# Patient Record
Sex: Male | Born: 1950 | Race: White | Hispanic: No | Marital: Married | Smoking: Former smoker
Health system: Southern US, Community
[De-identification: ages and names within clinical notes are randomized; demographics above are authoritative.]

## PROBLEM LIST (undated history)

## (undated) DIAGNOSIS — E7800 Pure hypercholesterolemia, unspecified: Secondary | ICD-10-CM

## (undated) DIAGNOSIS — D369 Benign neoplasm, unspecified site: Secondary | ICD-10-CM

## (undated) DIAGNOSIS — F319 Bipolar disorder, unspecified: Secondary | ICD-10-CM

## (undated) DIAGNOSIS — M069 Rheumatoid arthritis, unspecified: Secondary | ICD-10-CM

## (undated) DIAGNOSIS — Z9889 Other specified postprocedural states: Secondary | ICD-10-CM

## (undated) DIAGNOSIS — N189 Chronic kidney disease, unspecified: Secondary | ICD-10-CM

## (undated) DIAGNOSIS — A630 Anogenital (venereal) warts: Secondary | ICD-10-CM

## (undated) DIAGNOSIS — N400 Enlarged prostate without lower urinary tract symptoms: Secondary | ICD-10-CM

## (undated) HISTORY — DX: Chronic kidney disease, unspecified: N18.9

## (undated) HISTORY — PX: OTHER SURGICAL HISTORY: SHX169

## (undated) HISTORY — DX: Bipolar disorder, unspecified: F31.9

## (undated) HISTORY — DX: Other specified postprocedural states: Z98.890

## (undated) HISTORY — DX: Benign neoplasm, unspecified site: D36.9

## (undated) HISTORY — DX: Anogenital (venereal) warts: A63.0

## (undated) HISTORY — DX: Rheumatoid arthritis, unspecified: M06.9

## (undated) HISTORY — PX: APPENDECTOMY: SHX54

---

## 2002-02-09 DIAGNOSIS — D369 Benign neoplasm, unspecified site: Secondary | ICD-10-CM

## 2002-02-09 HISTORY — DX: Benign neoplasm, unspecified site: D36.9

## 2002-05-18 ENCOUNTER — Ambulatory Visit (HOSPITAL_COMMUNITY): Admission: RE | Admit: 2002-05-18 | Discharge: 2002-05-18 | Payer: Self-pay | Admitting: General Surgery

## 2006-02-09 DIAGNOSIS — Z9889 Other specified postprocedural states: Secondary | ICD-10-CM

## 2006-02-09 HISTORY — DX: Other specified postprocedural states: Z98.890

## 2006-03-04 ENCOUNTER — Ambulatory Visit (HOSPITAL_COMMUNITY): Admission: RE | Admit: 2006-03-04 | Discharge: 2006-03-04 | Payer: Self-pay | Admitting: General Surgery

## 2010-11-27 ENCOUNTER — Ambulatory Visit (INDEPENDENT_AMBULATORY_CARE_PROVIDER_SITE_OTHER): Payer: PRIVATE HEALTH INSURANCE | Admitting: Gastroenterology

## 2010-11-27 ENCOUNTER — Encounter: Payer: Self-pay | Admitting: Gastroenterology

## 2010-11-27 VITALS — BP 142/82 | HR 96 | Temp 97.9°F | Ht 70.0 in | Wt 259.6 lb

## 2010-11-27 DIAGNOSIS — R197 Diarrhea, unspecified: Secondary | ICD-10-CM

## 2010-11-27 DIAGNOSIS — D369 Benign neoplasm, unspecified site: Secondary | ICD-10-CM

## 2010-11-27 NOTE — Patient Instructions (Signed)
Please complete stool samples and return to our office.  In the meantime, start taking a probiotic daily. We have provided you with samples.  We will be in touch shortly to determine the next step. Try to follow a low-fat diet, avoiding greasy foods.

## 2010-11-27 NOTE — Progress Notes (Signed)
Referring Provider: Dr. Dimas Aguas Primary Care Physician:  Criss Rosales, MD Primary Gastroenterologist:  Dr. Jena Gauss   Chief Complaint  Patient presents with  . Abdominal Pain  . 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 He reports at times standing up and finding it "all over the place". He has been on Zocor chronically, but he stopped this 8 days ago. Since then, he has noted no further episodes of explosive diarrhea. He now has 2-3 soft bowel movements a day. He takes anti-diarrhea medication occasionally, especially when driving. Denies melena or hematochezia. Denies abdominal pain, nausea or vomiting. No change in wt.  Tries to avoid fatty/greasy foods, drives truck so hard to find places to stop. Tries to eat large amounts of vegetables.   Last colonoscopy by Dr. Lovell Sheehan in 2008, findings below. Hx of adenomatous polyps in 2004 per Dr. Lovell Sheehan' H&P in echart.   No recent abx or change in medication (other than stopping Zocor).    Past Medical History  Diagnosis Date  . Bipolar disorder   . Diabetes mellitus   . S/P colonoscopy Jan 2008    diverticulosis in sigmoid, Dr. Lovell Sheehan  . Adenomatous polyps 2004    per H&P in echart by Dr. Lovell Sheehan    Past Surgical History  Procedure Date  . Varicocele repair   . Appendectomy     Current Outpatient Prescriptions  Medication Sig Dispense Refill  . celecoxib (CELEBREX) 200 MG capsule Take 200 mg by mouth daily. As needed  (very seldom)       . lithium carbonate 300 MG capsule Take 300 mg by mouth 3 (three) times daily with meals. Takes two tablets in the AM and two tablets in the PM       . metFORMIN (GLUCOPHAGE) 850 MG tablet Take 850 mg by mouth 2 (two) times daily with a meal. One tablet in the AM and one tablet in the PM       . sitaGLIPtin (JANUVIA) 100 MG tablet Take 100 mg by mouth daily. IN the AM          Allergies as of 11/27/2010 - Review Complete 11/27/2010  Allergen Reaction Noted  . Penicillins Shortness Of Breath 11/27/2010    Family History  Problem Relation Age of Onset  . Colon cancer Neg Hx     History   Social History  . Marital Status: Single    Spouse Name: N/A    Number of Children: N/A  . Years of Education: N/A   Occupational History  . Not on file.   Social History Main Topics  . Smoking status: Former Games developer  . Smokeless tobacco: Not on file   Comment: Stopped smoking 10 years ago  . Alcohol Use: Not on file  . Drug Use: Not on file  . Sexually Active: Not on file   Other Topics Concern  . Not on file   Social History Narrative  . No narrative on file    Review of Systems: Gen: Denies any fever, chills, loss of appetite, fatigue, weight loss. CV: Denies chest pain, heart palpitations, syncope, peripheral edema. Resp: Denies shortness of breath with rest, cough, wheezing GI: Denies dysphagia or odynophagia. Denies hematemesis, fecal incontinence, or jaundice.  GU : Denies urinary burning, urinary frequency, urinary incontinence.  MS: Denies joint pain, muscle weakness, cramps, limited movement Derm: Denies rash, itching, dry skin  Psych: Denies depression, anxiety, confusion or memory loss  Heme: Denies bruising, bleeding, and enlarged lymph nodes.  Physical Exam: BP 142/82  Pulse 96  Temp(Src) 97.9 F (36.6 C) (Tympanic)  Ht 5\' 10"  (1.778 m)  Wt 259 lb 9.6 oz (117.754 kg)  BMI 37.25 kg/m2 General:   Alert and oriented. Well-developed, well-nourished, pleasant and cooperative. Obese  Head:  Normocephalic and atraumatic. Eyes:  Conjunctiva pink, sclera clear, no icterus.   Conjunctiva pink. Ears:  Normal auditory acuity. Nose:  No deformity, discharge,  or lesions. Mouth:  No deformity or lesions, mucosa pink and moist.  Neck:  Supple, without mass or thyromegaly. Lungs:  Clear to auscultation bilaterally, without wheezing, rales, or  rhonchi.  Heart:  S1, S2 present without murmurs noted.  Abdomen:  +BS, soft, obese, non-tender and non-distended. Without mass or HSM. No rebound or guarding. Large ventral hernia vs rectus diastasis Rectal:  Deferred  Msk:  Symmetrical without gross deformities. Normal posture. Extremities:  Without clubbing or edema. Neurologic:  Alert and  oriented x4;  grossly normal neurologically. Skin:  Intact, warm and dry without significant lesions or rashes Cervical Nodes:  No significant cervical adenopathy. Psych:  Alert and cooperative. Normal mood and affect.

## 2010-11-29 ENCOUNTER — Other Ambulatory Visit: Payer: Self-pay | Admitting: Gastroenterology

## 2010-12-01 ENCOUNTER — Encounter: Payer: Self-pay | Admitting: Gastroenterology

## 2010-12-01 ENCOUNTER — Ambulatory Visit (INDEPENDENT_AMBULATORY_CARE_PROVIDER_SITE_OTHER): Payer: PRIVATE HEALTH INSURANCE | Admitting: Gastroenterology

## 2010-12-01 DIAGNOSIS — R197 Diarrhea, unspecified: Secondary | ICD-10-CM | POA: Insufficient documentation

## 2010-12-01 DIAGNOSIS — D369 Benign neoplasm, unspecified site: Secondary | ICD-10-CM | POA: Insufficient documentation

## 2010-12-01 LAB — IFOBT (OCCULT BLOOD): IFOBT: NEGATIVE

## 2010-12-01 NOTE — Assessment & Plan Note (Signed)
60 year old male with at least a year-long hx of "explosive diarrhea", works as Naval architect. Has stopped Zocor X 8 days due to concerns of possible side effect of diarrhea; he has noticed significant improvement since this time. Now down to 2-3 soft stools per day. No melena or hematochezia, denies abdominal pain. Eats what is convenient while on the road, but he is trying to ingest higher fiber options, fruits, veggies. Last colonoscopy in 2008 and hx of adenomatous polyps as well. Will need surveillance in 2013. In the interim, will assess with stool studies, ifobt, dietary management with low-fat foods. Further recommendations after stool studies completed.

## 2010-12-02 LAB — STOOL CULTURE

## 2010-12-02 NOTE — Progress Notes (Signed)
Cc to PCP 

## 2010-12-03 NOTE — Progress Notes (Signed)
Negative ifobt. Stool studies negative. Will inquire on how patient is doing. ? If medication effect, as significant improvement with cessation of Zocor. 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 Will get update, proceed as necessary. Otherwise, surveillance colonoscopy should be completed in 2013 due to hx adenomatous polyps.

## 2010-12-03 NOTE — Progress Notes (Signed)
Quick Note:  All negative, ifobt negative. PR on pt? ______

## 2010-12-09 NOTE — Progress Notes (Signed)
Quick Note:  Tried to call pt- LMOM ______ 

## 2010-12-11 NOTE — Progress Notes (Signed)
Quick Note:  Unable to reach pt, mailed letter. ______

## 2010-12-16 NOTE — Progress Notes (Unsigned)
  Pt called to see what the results of his stool studies was. I informed him that they were all negative. I then asked him how he was doing and he said great with the probiotic. Will contact him in 2013 for a colonoscopy.

## 2010-12-16 NOTE — Progress Notes (Signed)
Great!

## 2012-06-30 ENCOUNTER — Ambulatory Visit (INDEPENDENT_AMBULATORY_CARE_PROVIDER_SITE_OTHER): Payer: BC Managed Care – PPO | Admitting: Psychiatry

## 2012-06-30 ENCOUNTER — Encounter (HOSPITAL_COMMUNITY): Payer: Self-pay | Admitting: Psychiatry

## 2012-06-30 VITALS — BP 136/86 | Ht 67.5 in | Wt 240.8 lb

## 2012-06-30 DIAGNOSIS — F3110 Bipolar disorder, current episode manic without psychotic features, unspecified: Secondary | ICD-10-CM

## 2012-06-30 DIAGNOSIS — F319 Bipolar disorder, unspecified: Secondary | ICD-10-CM

## 2012-06-30 DIAGNOSIS — F5105 Insomnia due to other mental disorder: Secondary | ICD-10-CM | POA: Insufficient documentation

## 2012-06-30 MED ORDER — DIVALPROEX SODIUM 500 MG PO DR TAB: 1500.0000 mg | DELAYED_RELEASE_TABLET | Freq: Every day | ORAL | Status: DC

## 2012-06-30 MED ORDER — QUETIAPINE FUMARATE 100 MG PO TABS: 200.0000 mg | ORAL_TABLET | Freq: Every day | ORAL | Status: DC

## 2012-06-30 NOTE — Patient Instructions (Signed)
Get lab today  CUT BACK/CUT OUT on sugar and carbohydrates, that means very limited fruits and starchy vegetables and very limited grains, breads  The goal is low GLYCEMIC INDEX.  CUT OUT all wheat, rye, or barley for the GLUTEN in them.  HIGH fat and LOW carbohydrate diet is the KEY.  Eat avocados, eggs, lean meat like grass fed beef and chicken  Nuts and seeds would be good foods as well.   Stevia is an excellent sweetener.  Safe for the brain.   Lowella Grip is also a good safe sweetener, not the baking blend form of Truvia  Almond butter is awesome.  Check out all this on the Internet.  Dr Heber Albertson is on the Internet with some good info about this.   http://www.drperlmutter.com is where that is.  An excellent site for info on this diet is http://paleoleap.com  [employer] makes dark chocolate that is sweetened with Stevia that is safe.  Set a timer for 8 or a certain number minutes and walk for that amount of time in the house or in the yard.  Mark the number of minutes on a calendar for that day.  Do that every day this week.  Then next week increase the time by 1 minutes and then mark the calendar with the number of minutes for that day.  Each week increase your exercise by one minute.  Keep a record of this so you can see the progress you are making.  Do this every day, just like eating and sleeping.  It is good for pain control, depression, and for your soul/spirit.  Bring the record in for your next visit so we can talk about your effort and how you feel with the new exercise program going and working for you.  Relaxation is the ultimate solution for you.  You can seek it through tub baths, bubble baths, essential oils or incense, walking or chatting with friends, listening to soft music, watching a candle burn and just letting all thoughts go and appreciating the true essence of the Creator.  Pets or animals may be very helpful.  You might spend some time with them and then  go do more directed meditation.  "I am Wishes Fulfilled Meditation" by Marylene Buerger and Lyndal Pulley may be helpful MUSIC for getting to sleep or for meditating You can order it from on line.  You might find the Chill channel on Pandora and explore the artists that you like better.   Take care of yourself.  No one else is standing up to do the job and only you know what you need.   GET SERIOUS about taking care of yourself.  Do the next right thing and that often means doing something to care for yourself along the lines of are you hungry, are you angry, are you lonely, are you tired, are you scared?  HALTS is what that stands for.  Call if problems or concerns.

## 2012-06-30 NOTE — Progress Notes (Signed)
Psychiatric Assessment Adult [phone]  Patient Identification:  Andrew Warren Date of Evaluation:  06/30/2012 Start Time: 11:30 AM End Time: 12:45 PM  Chief Complaint: "I just got switched from Lithium which was really effecting my kidneys to the Depakote and Seroquel". Chief Complaint  Patient presents with  . Depression  . Establish Care  . 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  This was concluded to be bipolar disorder.  He was incarcerated several times and finally got on Lithium with a doctor that did well for him with and did well for decades.  Over the past 7 years he has noted increased girth and now noted severe kidney dysfunction from the Lithium.  He has been switched to 1500 mg Depakote and 600 mg Seroquel. With the Seroquel he was getting up in the middle of the night and bump into walls and hit his head and black out and fall down.  He has adjusted this to 400 mg and is doing much better.  Pt feels that a slightly lower dose might be better because of grogginess during the day.  He feels so much better now off the Lithium because he has energy to get up and walk around and do more than just lay on the couch and eat, which he did continuously on Lithium.  HPI Review of Systems  Constitutional: Positive for diaphoresis and activity change.       With change from lithium  HENT: Positive for congestion and postnasal drip.        Allergies  Eyes: Negative.   Respiratory: Negative.   Cardiovascular: Negative.   Gastrointestinal: Negative.   Genitourinary: Negative.   Musculoskeletal: Positive for back pain and arthralgias.  Neurological: Negative for dizziness, tremors, seizures, syncope, facial asymmetry, speech difficulty, weakness, light-headedness, numbness and headaches.  Psychiatric/Behavioral:  Positive for agitation. Negative for suicidal ideas, hallucinations, behavioral problems, confusion, sleep disturbance, self-injury, dysphoric mood and decreased concentration. The patient is not nervous/anxious and is not hyperactive.    Physical Exam  Depressive Symptoms: depressed mood, hopelessness,  (Hypo) Manic Symptoms:   Elevated Mood:  Yes Irritable Mood:  Yes Grandiosity:  Yes Distractibility:  Yes Labiality of Mood:  Yes Delusions:  No Hallucinations:  No Impulsivity:  No Sexually Inappropriate Behavior:  No Financial Extravagance:  he doesn't think so, but the wife thinks so Flight of Ideas:  No  Anxiety Symptoms: Excessive Worry:  No Panic Symptoms:  No Agoraphobia:  No Obsessive Compulsive: No Specific Phobias:  Yes Social Anxiety:  No  Psychotic Symptoms:  None  PTSD Symptoms: Ever had a traumatic exposure:  No  Traumatic Brain Injury: Yes Blunt Trauma 6 or 7 months ago  Past Psychiatric History: Diagnosis: Bipolar disorder  Hospitalizations: [place] recently  Outpatient Care: PCP  Substance Abuse Care: none  Self-Mutilation: none  Suicidal Attempts: none  Violent Behaviors: none   Past Medical History:   Past Medical History  Diagnosis Date  . Bipolar disorder   . Diabetes mellitus   . S/P colonoscopy Jan 2008    diverticulosis in sigmoid, Dr. Lovell Sheehan  . Adenomatous polyps 2004    per H&P in echart by Dr. Lovell Sheehan  . Rheumatoid arthritis   . Genital warts   . Chronic kidney disease    History of Loss of Consciousness:  Yes Seizure History:  No Cardiac History:  No Allergies:   Allergies  Allergen Reactions  . Penicillins Shortness Of Breath    Shortness of breath/ tightning in throat   Current Medications:  Current Outpatient Prescriptions  Medication Sig Dispense Refill  . cyclobenzaprine (FLEXERIL) 10 MG tablet Take 10 mg by mouth 3 (three) times daily as needed for muscle spasms.      . divalproex (DEPAKOTE) 500 MG DR tablet Take  500 mg by mouth 3 (three) times daily.      . QUEtiapine (SEROQUEL) 200 MG tablet Take 600 mg by mouth at bedtime.       . sitaGLIPtin (JANUVIA) 100 MG tablet Take 100 mg by mouth daily. IN the AM       . celecoxib (CELEBREX) 200 MG capsule Take 200 mg by mouth daily. As needed  (very seldom)       . lithium carbonate 300 MG capsule Take 300 mg by mouth 3 (three) times daily with meals. Takes two tablets in the AM and two tablets in the PM       . metFORMIN (GLUCOPHAGE) 850 MG tablet Take 850 mg by mouth 2 (two) times daily with a meal. One tablet in the AM and one tablet in the PM        No current facility-administered medications for this visit.    Previous Psychotropic Medications:  Medication Dose   Lithium     Haldol    Thorazine    Prolixin    Mellaril    Shock treatments    Substance Abuse History in the last 12 months: Substance Age of 1st Use Last Use Amount Specific Type  Nicotine  18  7 or 8 years ago      Alcohol  18  years ago      Cannabis  [age]      Opiates  [age]      Cocaine  [age]      Methamphetamines  [age]      LSD  [age]      Ecstasy  none        Benzodiazepines  [age]      Caffeine  childhood  this AM      Inhalants  none        Others:       sugar  childhood     Medical Consequences of Substance Abuse: none Legal Consequences of Substance Abuse: none Family Consequences of Substance Abuse: none Blackouts:  No DT's:  No Withdrawal Symptoms:  No   Social History: Current Place of Residence: [address] [place] [address] Place of Birth: [place], [place] Family Members: Wife Marital Status:  Married Children: 2  Sons: 2  Daughters: 0 Relationships: wife Education:  Corporate treasurer Problems/Performance: none Religious Beliefs/Practices: [employer] History of Abuse: none Armed forces technical officer; Farming and trucking, Manufacturing systems engineer History:  None. Legal History: incarcerations for safety of self or  others Hobbies/Interests: gardening fishing  Family History:   Family History  Problem Relation Age of Onset  . Colon cancer Neg Hx   . ADD / ADHD Neg Hx   . Alcohol abuse Neg Hx   . Drug abuse Neg Hx   . Anxiety disorder Neg Hx   . Bipolar disorder Neg Hx   . Depression Neg Hx   . OCD Neg Hx   . Paranoid behavior Neg Hx   . Sexual abuse Neg Hx   .  Physical abuse Neg Hx   . Dementia Mother   . Schizophrenia Father   . Heart disease Maternal Grandfather   . Heart disease Maternal Grandmother   . Heart disease Paternal Grandfather   . Heart disease Paternal Grandmother   . Seizures Sister     Mental Status Examination/Evaluation: Objective:  Appearance: Casual  Eye Contact::  Good  Speech:  Pressured  Volume:  Normal  Mood:  good  Affect:  Congruent  Thought Process:  Coherent and Linear  Orientation:  Full (Time, Place, and Person)  Thought Content:  WDL  Suicidal Thoughts:  No  Homicidal Thoughts:  No  Judgement:  Good  Insight:  Fair  Psychomotor Activity:  Normal  Akathisia:  No  Handed:  Right  AIMS (if indicated):    Assets:  Communication Skills Desire for Improvement Financial Resources/Insurance    Laboratory/X-Ray Psychological Evaluation(s)   Depakote level  none   Assessment:    AXIS I Bipolar, Manic  AXIS II Deferred  AXIS III Past Medical History  Diagnosis Date  . Bipolar disorder   . Diabetes mellitus   . S/P colonoscopy Jan 2008    diverticulosis in sigmoid, Dr. Lovell Sheehan  . Adenomatous polyps 2004    per H&P in echart by Dr. Lovell Sheehan  . Rheumatoid arthritis   . Genital warts   . Chronic kidney disease      AXIS IV other psychosocial or environmental problems  AXIS V 41-50 serious symptoms   Treatment Plan/Recommendations:  Psychotherapy: Supportive  Medications: Depakote and Seroquel  Routine PRN Medications:  No  Consultations: none  Safety Concerns:  none  Other:     Plan/Discussion: I took his vitals.  I reviewed CC,  tobacco/med/surg Hx, meds effects/ side effects, problem list, therapies and responses as well as current situation/symptoms discussed options. See orders and pt instructions for more details.  MEDICATIONS this encounter: Meds ordered this encounter  Medications  . cyclobenzaprine (FLEXERIL) 10 MG tablet    Sig: Take 10 mg by mouth 3 (three) times daily as needed for muscle spasms.  . divalproex (DEPAKOTE) 500 MG DR tablet    Sig: Take 500 mg by mouth 3 (three) times daily.  . QUEtiapine (SEROQUEL) 200 MG tablet    Sig: Take 600 mg by mouth at bedtime.     Medical Decision Making Problem Points:  New problem, with no additional work-up planned (3) and Review of psycho-social stressors (1) Data Points:  Review or order clinical lab tests (1) Review of medication regiment & side effects (2) Review of new medications or change in dosage (2)  I certify that outpatient services furnished can reasonably be expected to improve the patient's condition.   Orson Aloe, MD, [facility]

## 2012-07-01 ENCOUNTER — Telehealth (HOSPITAL_COMMUNITY): Payer: Self-pay | Admitting: Psychiatry

## 2012-07-01 NOTE — Telephone Encounter (Signed)
Called and spoke with pt who reported that he had gone up to 2000 mg of Depakote.  His level had been 64.8 on 1500 mg.  His manic presentation yesterday had prompted the increase.

## 2012-07-05 ENCOUNTER — Ambulatory Visit (HOSPITAL_COMMUNITY): Payer: PRIVATE HEALTH INSURANCE | Admitting: Psychiatry

## 2012-07-28 ENCOUNTER — Telehealth (HOSPITAL_COMMUNITY): Payer: Self-pay | Admitting: Psychiatry

## 2012-07-28 DIAGNOSIS — F5105 Insomnia due to other mental disorder: Secondary | ICD-10-CM

## 2012-07-28 DIAGNOSIS — F319 Bipolar disorder, unspecified: Secondary | ICD-10-CM

## 2012-07-28 MED ORDER — DIVALPROEX SODIUM 500 MG PO DR TAB: 1500.0000 mg | DELAYED_RELEASE_TABLET | Freq: Every day | ORAL | Status: DC

## 2012-07-28 MED ORDER — QUETIAPINE FUMARATE 200 MG PO TABS: 300.0000 mg | ORAL_TABLET | Freq: Every day | ORAL | Status: DC

## 2012-07-28 NOTE — Telephone Encounter (Signed)
Pt reports feeling the listed symptoms.  That could be Depakote toxicity.  Will cut that back to 1500 and bump the Seroquel up some. He would not avail himself to an appointment tomorrow.

## 2012-08-01 ENCOUNTER — Ambulatory Visit (HOSPITAL_COMMUNITY): Payer: Self-pay | Admitting: Psychiatry

## 2012-08-05 ENCOUNTER — Telehealth (HOSPITAL_COMMUNITY): Payer: Self-pay | Admitting: Psychiatry

## 2012-08-05 DIAGNOSIS — F319 Bipolar disorder, unspecified: Secondary | ICD-10-CM

## 2012-08-05 NOTE — Telephone Encounter (Signed)
Depakote level ordered

## 2012-08-11 ENCOUNTER — Encounter (HOSPITAL_COMMUNITY): Payer: Self-pay | Admitting: Psychiatry

## 2012-08-11 ENCOUNTER — Ambulatory Visit (HOSPITAL_COMMUNITY): Payer: Self-pay | Admitting: Psychiatry

## 2012-08-11 ENCOUNTER — Ambulatory Visit (INDEPENDENT_AMBULATORY_CARE_PROVIDER_SITE_OTHER): Payer: BC Managed Care – PPO | Admitting: Psychiatry

## 2012-08-11 VITALS — BP 140/82 | Wt 245.0 lb

## 2012-08-11 DIAGNOSIS — F5105 Insomnia due to other mental disorder: Secondary | ICD-10-CM

## 2012-08-11 DIAGNOSIS — Z79899 Other long term (current) drug therapy: Secondary | ICD-10-CM

## 2012-08-11 DIAGNOSIS — F319 Bipolar disorder, unspecified: Secondary | ICD-10-CM

## 2012-08-11 MED ORDER — QUETIAPINE FUMARATE 300 MG PO TABS: 300.0000 mg | ORAL_TABLET | Freq: Every day | ORAL | Status: DC

## 2012-08-11 NOTE — Progress Notes (Signed)
Patient Identification:  Andrew Warren Date of Evaluation:  08/11/2012  Chief Complaint: I think I'm doing better. No chief complaint on file.  History of Chief Complaint:   Patient is 62 year old Caucasian married man who came for his followup appointment.  He is taking Depakote 1500 mg at bedtime and Seroquel 300 mg at bedtime.  His medications were increased by Dr. Dan Humphreys on his last visit and recommend to take Depakote 2000 mg and Seroquel 500 mg however patient complaining of dizziness tremors sedation and feeling very tired.  Dr. Dan Humphreys suggested him on the phone cut down his Depakote to 1500 and Seroquel to 300 mg.  He also suggested to have Depakote level which he has not done so far.  Patient is tolerating his does very well.  He denies any tremors shakes or dizziness but remains very tired .  He is also very scared to try any other medication .  He wants to give more time to his current medication.  He denies any irritability anger or any mood swing.  He denies any hallucination or any paranoia.  He scheduled to see his nephrologist one more time .  We do not have any records from his nephrologist who told him to stop lithium because he has abnormal kidney functions.  He is also concerned about his weight , he has gained weight since he is no longer taking metformin.  His oral hypoglycemic medications are changed and now he is taking Januvia .  Patient I Januvia because it is giving good numbers on his blood sugar but he has gained weight.  Patient is scheduled to see his primary care physician to discuss more in detail.  Is not drinking or using any illegal substance. HPI Review of Systems  Constitutional:       With change from lithium  HENT:       Allergies  Eyes: Negative.   Respiratory: Negative.   Cardiovascular: Negative.   Gastrointestinal: Negative.   Genitourinary: Negative.   Musculoskeletal: Positive for back pain and arthralgias.  Neurological: Positive for  light-headedness.  Psychiatric/Behavioral: Negative for suicidal ideas, hallucinations, behavioral problems, confusion, sleep disturbance, self-injury, dysphoric mood and decreased concentration. The patient is not nervous/anxious and is not hyperactive.     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 Patient has one psychiatric hospitalization few months ago when he was traveling [place].  At that time he was not taking lithium and the Seroquel was started 25 mg and Depakote 250 mg.  Patient denies any history of psychosis or paranoia but admitted mania and depression and severe mood swing.  He denies any history of suicidal attempt .    Alcohol and substance use history. 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  Past Medical History:   Past Medical History  Diagnosis Date  . Bipolar disorder   . Diabetes mellitus   . S/P colonoscopy Jan 2008    diverticulosis in sigmoid, Dr. Lovell Sheehan  . Adenomatous polyps 2004    per H&P in echart by Dr. Lovell Sheehan  . Rheumatoid arthritis(714.0)   . Genital warts   . Chronic kidney disease    History of Loss of Consciousness:  Yes Seizure History:  No  Cardiac History:  No Allergies:   Allergies  Allergen Reactions  . Penicillins Shortness Of Breath    Shortness of breath/ tightning in throat   Current Medications:  Current Outpatient Prescriptions  Medication Sig Dispense Refill  . cyclobenzaprine (FLEXERIL) 10 MG tablet Take 10 mg by mouth 3 (three) times daily as needed for muscle spasms.      . divalproex (DEPAKOTE) 500 MG DR tablet Take 3 tablets (1,500 mg total) by mouth at bedtime.  90 tablet  1  . sitaGLIPtin (JANUVIA) 100 MG tablet Take 100 mg by mouth daily.  IN the AM       . QUEtiapine (SEROQUEL) 300 MG tablet Take 1 tablet (300 mg total) by mouth at bedtime.  60 tablet  1   No current facility-administered medications for this visit.    Social History: Current Place of Residence: [address] [place] [address] Place of Birth: [place], [place] Family Members: Wife Marital Status:  Married Children: 2  Sons: 2  Daughters: 0 Relationships: wife Education:  Corporate treasurer Problems/Performance: none Religious Beliefs/Practices: Presbeterian History of Abuse: none Armed forces technical officer; Farming and trucking, Manufacturing systems engineer History:  None. Legal History: incarcerations for safety of self or others Hobbies/Interests: gardening fishing  Family History:   Family History  Problem Relation Age of Onset  . Colon cancer Neg Hx   . ADD / ADHD Neg Hx   . Alcohol abuse Neg Hx   . Drug abuse Neg Hx   . Anxiety disorder Neg Hx   . Bipolar disorder Neg Hx   . Depression Neg Hx   . OCD Neg Hx   . Paranoid behavior Neg Hx   . Sexual abuse Neg Hx   . Physical abuse Neg Hx   . Dementia Mother   . Schizophrenia Father   . Heart disease Maternal Grandfather   . Heart disease Maternal Grandmother   . Heart disease Paternal Grandfather   . Heart disease Paternal Grandmother   . Seizures Sister     Mental status examination. Patient is obese male who is casually dressed and fairly groomed.  His speech is slow and at times rambling.  He described his mood is tired and his affect is mood appropriate.  He denies any active or passive suicidal thoughts or homicidal thoughts.  He denies any auditory or visual hallucination.  There were no tremors or shakes present.  There were no paranoia or delusion obsession present at this time.  His psychomotor activity is slowed.  No no flight of idea [place] or any loose association.  He's alert and oriented x3.  His insight judgment and impulse control is okay.  Assessment:   Axis I  bipolar disorder NOS Axis II deferred Axis III diabetes mellitus, obesity Axis IV mild to moderate Axis V 5055  Plan At this time patient is fairly stable on his Depakote and Seroquel dosage.  He still has sedation and feeling tired but he is afraid to take any other medication.  I recommend to have his Depakote level along with hemoglobin A1c and comprehensive metabolic panel.  We're still waiting [place] from his nephrologist Dr. Salomon Mast.  Reassurance given.  I explained that medicine may take some time to get adjusted in his system.  Recommend to call us back it is a question concerns or if he feels worsening of the symptom.  Discuss safety plan that anytime having active suicidal thoughts or homicidal thoughts and he to call 911 or go to  local emergency room.  A new prescription of Seroquel 300 mg with one refill was given.  Patient still has refill remaining on his Depakote.  Time spent 25 minutes.  More than 50% of the time spent in psychoeducation, counseling and coordination of care.  Medical Decision Making Problem Points:  Established problem, stable/improving (1), Review of last therapy session (1) and Review of psycho-social stressors (1) Data Points:  Review or order clinical lab tests (1) Review of medication regiment & side effects (2) Review of new medications or change in dosage (2)  I certify that outpatient services furnished can reasonably be expected to improve the patient's condition.   Cathi Hazan T., MD,

## 2012-08-12 LAB — HEMOGLOBIN A1C
Hgb A1c MFr Bld: 6.2 % — ABNORMAL HIGH (ref <5.7)
Mean Plasma Glucose: 131 mg/dL — ABNORMAL HIGH (ref <117)

## 2012-08-12 LAB — VALPROIC ACID LEVEL: Valproic Acid Lvl: 57.6 ug/mL (ref 50.0–100.0)

## 2012-08-15 ENCOUNTER — Ambulatory Visit (HOSPITAL_COMMUNITY): Payer: Self-pay | Admitting: Psychiatry

## 2012-09-15 ENCOUNTER — Telehealth (HOSPITAL_COMMUNITY): Payer: Self-pay | Admitting: Psychiatry

## 2012-09-15 ENCOUNTER — Other Ambulatory Visit (HOSPITAL_COMMUNITY): Payer: Self-pay | Admitting: Psychiatry

## 2012-09-15 NOTE — Telephone Encounter (Signed)
Pharmacy is wondering if patient can try Depakote ER since Depakote DR is unavailable.  Okay to dispense Depakote ER.

## 2012-10-13 ENCOUNTER — Ambulatory Visit (HOSPITAL_COMMUNITY): Payer: Self-pay | Admitting: Psychiatry

## 2012-10-13 ENCOUNTER — Encounter (HOSPITAL_COMMUNITY): Payer: Self-pay | Admitting: Psychiatry

## 2012-10-13 ENCOUNTER — Ambulatory Visit (INDEPENDENT_AMBULATORY_CARE_PROVIDER_SITE_OTHER): Payer: BC Managed Care – PPO | Admitting: Psychiatry

## 2012-10-13 VITALS — BP 138/88 | Ht 69.0 in | Wt 255.0 lb

## 2012-10-13 DIAGNOSIS — F319 Bipolar disorder, unspecified: Secondary | ICD-10-CM

## 2012-10-13 MED ORDER — DIVALPROEX SODIUM 500 MG PO DR TAB: 1500.0000 mg | DELAYED_RELEASE_TABLET | Freq: Every day | ORAL | Status: DC

## 2012-10-13 MED ORDER — QUETIAPINE FUMARATE 100 MG PO TABS: 100.0000 mg | ORAL_TABLET | Freq: Three times a day (TID) | ORAL | Status: DC

## 2012-10-13 NOTE — Progress Notes (Signed)
Patient ID: Andrew Warren, male   DOB: 02-06-1951, 62 y.o.   MRN: [id number]    Patient Identification:  KRISHAN MCBREEN Date of Evaluation:  10/13/2012  Chief Complaint: I think I'm doing better. Chief Complaint  Patient presents with  . Manic Behavior  . Medication Refill  . Follow-up   History of Chief Complaint:   This patient is a 62 year old married white male who lives with his wife in [place]. He has 4 grown children. 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  The patient states that when he was a young adult he was manic and out of control. He was initially misdiagnosed as being paranoid schizophrenic. He was in and out of hospitals proximally 7 times between the ages of 79 and 57. He was eventually more accurately diagnosed as being bipolar and was started on lithium. He did very well on lithium for a number of years and was monitored by his family doctor, Dr. Dimas Aguas in [place].  Several months ago however he began to be very oversedated. Apparently his renal function was declining and his lithium level had risen. He was seen by nephrologist who recommended he get off lithium immediately. Since then his family Dr. put him on low doses of Seroquel and Depakote. 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 Since then he's been on his current doses of medication with some modifications. On 1500 mg of Depakote his blood level is 51. He is on 300 mg of Seroquel with that at bedtime. He is sleeping too soundly to the point of being incontinent at night. He is craving food all the time and has gained about 10 pounds in the last month. He is also type II diabetic and is concerned about how this will affect his blood sugar. Interestingly hot, since getting off lithium his blood sugars have come down. He's no longer manic or hyperactive  and his mood is stable but he doesn't like the side effects of incontinence and weight gain. HPI Review of Systems  Constitutional:       With change from lithium  HENT:       Allergies  Eyes: Negative.   Respiratory: Negative.   Cardiovascular: Negative.   Gastrointestinal: Negative.   Genitourinary: Positive for enuresis.  Musculoskeletal: Positive for back pain and arthralgias.  Neurological: Negative for light-headedness.  Psychiatric/Behavioral: Negative for suicidal ideas, hallucinations, behavioral problems, confusion, sleep disturbance, self-injury, dysphoric mood and decreased concentration. The patient is not nervous/anxious and is not hyperactive.     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 Patient has one psychiatric hospitalization few months ago when he was traveling [place].  At that time he was not taking lithium and the Seroquel was started 25 mg and Depakote 250 mg.  Patient denies any history of psychosis or paranoia but admitted mania and depression and severe mood swing.  He denies any history of suicidal attempt .    Alcohol and substance use history. 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  Past Medical History:   Past Medical History  Diagnosis Date  . Bipolar disorder   . Diabetes mellitus   . S/P colonoscopy Jan 2008    diverticulosis in sigmoid, Dr. Lovell Sheehan  . Adenomatous polyps 2004    per H&P in echart by Dr. Lovell Sheehan  . Rheumatoid arthritis(714.0)   . Genital warts   . Chronic kidney disease   . Diabetes mellitus, type II    History of Loss of Consciousness:  Yes Seizure History:  No Cardiac History:  No Allergies:   Allergies  Allergen Reactions  .  Penicillins Shortness Of Breath    Shortness of breath/ tightning in throat  . Lithium     Caused renal failure   Current Medications:  Current Outpatient Prescriptions  Medication Sig Dispense Refill  . cyclobenzaprine (FLEXERIL) 10 MG tablet Take 10 mg by mouth 3 (three) times daily as needed for muscle spasms.      . divalproex (DEPAKOTE) 500 MG DR tablet Take 3 tablets (1,500 mg total) by mouth at bedtime.  90 tablet  2  . sitaGLIPtin (JANUVIA) 100 MG tablet Take 100 mg by mouth daily. IN the AM       . QUEtiapine (SEROQUEL) 100 MG tablet Take 1 tablet (100 mg total) by mouth 3 (three) times daily.  90 tablet  2   No current facility-administered medications for this visit.    Social History: Current Place of Residence: [address] [place] [address] Place of Birth: [place], [place] Family Members: Wife Marital Status:  Married Children: 2  Sons: 2  Daughters: 0 Relationships: wife Education:  Corporate treasurer Problems/Performance: none Religious Beliefs/Practices: Presbeterian History of Abuse: none Armed forces technical officer; Farming and trucking, Manufacturing systems engineer History:  None. Legal History: incarcerations for safety of self or others Hobbies/Interests: gardening fishing  Family History:   Family History  Problem Relation Age of Onset  . Colon cancer Neg Hx   . ADD / ADHD Neg Hx   . Alcohol abuse Neg Hx   . Drug abuse Neg Hx   . Anxiety disorder Neg Hx   . Bipolar disorder Neg Hx   . Depression Neg Hx   . OCD Neg Hx   . Paranoid behavior Neg Hx   . Sexual abuse Neg Hx   . Physical abuse Neg Hx   . Dementia Mother   . Schizophrenia Father   . Heart disease Maternal Grandfather   . Heart disease Maternal Grandmother   . Heart disease Paternal Grandfather   . Heart disease Paternal Grandmother   . Seizures Sister     Mental status examination. Patient is obese male who is casually dressed and fairly groomed.  His speech is clear and  understandable  He described his mood is good and his affect is mood appropriate but slightly irritable  He denies any active or passive suicidal thoughts or homicidal thoughts.  He denies any auditory or visual hallucination.  There were no tremors or shakes present.  There were no paranoia or delusion obsession present at this time.  His psychomotor activity is within normal limits.  No no flight of idea [place] or any loose association.  He's alert and oriented x3.  His insight judgment and impulse control is okay.  Assessment:   Axis I bipolar disorder1 Axis II deferred Axis III diabetes mellitus, obesity, recent renal failure secondary to lithium Axis IV mild to moderate Axis V 75  Plan Currently the patient seems stable psychiatrically but he is having significant side effects which I think are from  the Seroquel. We will change his Seroquel dosage to 100 mg tablets so he can cut down from 300 mg to 200 mg at bedtime. He will continue on his current Depakote dose and return to see me in 2 months. He can call at anytime if his bipolar symptoms remarked Time spent 25 minutes.  More than 50% of the time spent in psychoeducation, counseling and coordination of care.  Medical Decision Making Problem Points:  Established problem, stable/improving (1), Review of last therapy session (1) and Review of psycho-social stressors (1) Data Points:  Review or order clinical lab tests (1) Review of medication regiment & side effects (2) Review of new medications or change in dosage (2)  I certify that outpatient services furnished can reasonably be expected to improve the patient's condition.   Diannia Ruder, MD,

## 2012-10-14 ENCOUNTER — Telehealth (HOSPITAL_COMMUNITY): Payer: Self-pay | Admitting: Psychiatry

## 2012-10-14 NOTE — Telephone Encounter (Signed)
Faxed to pharmacy

## 2012-12-13 ENCOUNTER — Ambulatory Visit (HOSPITAL_COMMUNITY): Payer: Self-pay | Admitting: Psychiatry

## 2013-01-04 ENCOUNTER — Ambulatory Visit (INDEPENDENT_AMBULATORY_CARE_PROVIDER_SITE_OTHER): Payer: BC Managed Care – PPO | Admitting: Psychiatry

## 2013-01-04 ENCOUNTER — Encounter (HOSPITAL_COMMUNITY): Payer: Self-pay | Admitting: Psychiatry

## 2013-01-04 VITALS — BP 140/90 | Ht 69.0 in | Wt 258.0 lb

## 2013-01-04 DIAGNOSIS — F319 Bipolar disorder, unspecified: Secondary | ICD-10-CM

## 2013-01-04 MED ORDER — QUETIAPINE FUMARATE 100 MG PO TABS: 100.0000 mg | ORAL_TABLET | Freq: Three times a day (TID) | ORAL | Status: DC

## 2013-01-04 MED ORDER — DIVALPROEX SODIUM ER 500 MG PO TB24: 2000.0000 mg | ORAL_TABLET | Freq: Every day | ORAL | Status: DC

## 2013-01-04 NOTE — Progress Notes (Signed)
Patient ID: Andrew Warren, male   DOB: 03-26-1950, 62 y.o.   MRN: [id number] Patient ID: Andrew Warren, male   DOB: 06/18/1950, 62 y.o.   MRN: [id number]    Patient Identification:  Andrew Warren Date of Evaluation:  01/04/2013  Chief Complaint: I think I'm doing better. Chief Complaint  Patient presents with  . Depression  . Manic Behavior  . Follow-up   History of Chief Complaint:   This patient is a 62 year old married white male who lives with his wife in [place]. He has 4 grown children. 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  The patient states that when he was a young adult he was manic and out of control. He was initially misdiagnosed as being paranoid schizophrenic. He was in and out of hospitals proximally 7 times between the ages of 62 and 83. He was eventually more accurately diagnosed as being bipolar and was started on lithium. He did very well on lithium for a number of years and was monitored by his family doctor, Dr. Dimas Aguas in [place].  Several months ago however he began to be very oversedated. Apparently his renal function was declining and his lithium level had risen. He was seen by nephrologist who recommended he get off lithium immediately. Since then his family Dr. put him on low doses of Seroquel and Depakote. 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 Since then he's been on his current doses of medication with some modifications. On 1500 mg of Depakote his blood level is 51. He is on 300 mg of Seroquel with that at bedtime. He is sleeping too soundly to the point of being incontinent at night. He is craving food all the time and has gained about 10 pounds in the last month. He is also type II diabetic and is concerned about how this will affect his blood sugar. Interestingly hot, since getting off  lithium his blood sugars have come down. He's no longer manic or hyperactive and his mood is stable but he doesn't like the side effects of incontinence and weight gain.  The patient and his wife return for followup today after 2 months. He's been doing okay but is still not where he wants to be. He's irritable and argumentative much of the time. He's not happy like he used to be when he was on lithium. Part of this is his lifestyle. He works all the time and doesn't do much exercise. His diabetes is not under the best control either. We discussed increasing his Depakote because his level was only 51 and he is in agreement. He denies auditory or visual hallucinations or suicidal ideation and his thought processes organized. HPI Review of Systems  Constitutional:       With change from lithium  HENT:       Allergies  Eyes: Negative.   Respiratory: Negative.   Cardiovascular: Negative.   Gastrointestinal: Negative.   Genitourinary: Positive for enuresis.  Musculoskeletal: Positive for arthralgias and back pain.  Neurological: Negative for light-headedness.  Psychiatric/Behavioral: Negative for suicidal ideas, hallucinations, behavioral problems, confusion, sleep disturbance, self-injury, dysphoric mood and decreased concentration. The patient is not nervous/anxious and is not hyperactive.     Physical Exam  Psychiatric:  See mental status examination    PTSD Symptoms: Ever had a traumatic exposure:  No  Traumatic  Brain Injury: Yes Blunt Trauma 6 or 7 months ago  Past psychiatric history. Patient has one psychiatric hospitalization few months ago when he was traveling [place].  At that time he was not taking lithium and the Seroquel was started 25 mg and Depakote 250 mg.  Patient denies any history of psychosis or paranoia but admitted mania and depression and severe mood swing.  He denies any history of suicidal attempt .    Alcohol and substance use history. Patient admitted history  of drinking heavily in the past along with using cocaine opiates marijuana amphetamines LSD but claims to be sober for past few years.  Past Medical History:   Past Medical History  Diagnosis Date  . Bipolar disorder   . Diabetes mellitus   . S/P colonoscopy Jan 2008    diverticulosis in sigmoid, Dr. Lovell Sheehan  . Adenomatous polyps 2004    per H&P in echart by Dr. Lovell Sheehan  . Rheumatoid arthritis(714.0)   . Genital warts   . Chronic kidney disease   . Diabetes mellitus, type II    History of Loss of Consciousness:  Yes Seizure History:  No Cardiac History:  No Allergies:   Allergies  Allergen Reactions  . Penicillins Shortness Of Breath    Shortness of breath/ tightning in throat  . Lithium     Caused renal failure   Current Medications:  Current Outpatient Prescriptions  Medication Sig Dispense Refill  . cyclobenzaprine (FLEXERIL) 10 MG tablet Take 10 mg by mouth 3 (three) times daily as needed for muscle spasms.      . divalproex (DEPAKOTE ER) 500 MG 24 hr tablet Take 4 tablets (2,000 mg total) by mouth daily.  120 tablet  3  . QUEtiapine (SEROQUEL) 100 MG tablet Take 1 tablet (100 mg total) by mouth 3 (three) times daily.  90 tablet  2  . sitaGLIPtin (JANUVIA) 100 MG tablet Take 100 mg by mouth daily. IN the AM        No current facility-administered medications for this visit.    Social History: Current Place of Residence: [address] [place] [address] Place of Birth: [place], [place] Family Members: Wife Marital Status:  Married Children: 2  Sons: 2  Daughters: 0 Relationships: wife Education:  Corporate treasurer Problems/Performance: none Religious Beliefs/Practices: Presbeterian History of Abuse: none Armed forces technical officer; Farming and trucking, Manufacturing systems engineer History:  None. Legal History: incarcerations for safety of self or others Hobbies/Interests: gardening fishing  Family History:   Family History  Problem Relation Age of  Onset  . Colon cancer Neg Hx   . ADD / ADHD Neg Hx   . Alcohol abuse Neg Hx   . Drug abuse Neg Hx   . Anxiety disorder Neg Hx   . Bipolar disorder Neg Hx   . Depression Neg Hx   . OCD Neg Hx   . Paranoid behavior Neg Hx   . Sexual abuse Neg Hx   . Physical abuse Neg Hx   . Dementia Mother   . Schizophrenia Father   . Heart disease Maternal Grandfather   . Heart disease Maternal Grandmother   . Heart disease Paternal Grandfather   . Heart disease Paternal Grandmother   . Seizures Sister     Mental status examination. Patient is obese male who is casually dressed and fairly groomed.  His speech is clear and understandable  He described his mood is good and his affect is mood appropriate but slightly irritable  He denies any active  or passive suicidal thoughts or homicidal thoughts.  He denies any auditory or visual hallucination.  There were no tremors or shakes present.  There were no paranoia or delusion obsession present at this time.  His psychomotor activity is within normal limits.  No no flight of idea [place] or any loose association.  He's alert and oriented x3.  His insight judgment and impulse control is okay.  Assessment:   Axis I bipolar disorder1 Axis II deferred Axis III diabetes mellitus, obesity, recent renal failure secondary to lithium Axis IV mild to moderate Axis V 75  Plan Currently the patient is a bit depressed and irritable. He will increase Depakote ER to 1000 mg each bedtime and recheck the level in about 3 weeks when he is back in town. He'll continue Seroquel at 200 mg twice a day. He will  return to see me in 2 months. He can call at anytime if his bipolar symptoms remarked Time spent 25 minutes.  More than 50% of the time spent in psychoeducation, counseling and coordination of care.  Medical Decision Making Problem Points:  Established problem, stable/improving (1), Review of last therapy session (1) and Review of psycho-social stressors (1) Data Points:   Review or order clinical lab tests (1) Review of medication regiment & side effects (2) Review of new medications or change in dosage (2)  I certify that outpatient services furnished can reasonably be expected to improve the patient's condition.   Diannia Ruder, MD,

## 2013-01-07 ENCOUNTER — Other Ambulatory Visit (HOSPITAL_COMMUNITY): Payer: Self-pay | Admitting: Emergency Medicine

## 2013-01-07 ENCOUNTER — Emergency Department (HOSPITAL_COMMUNITY)
Admission: EM | Admit: 2013-01-07 | Discharge: 2013-01-07 | Disposition: A | Discharge status: Home / Self Care | Payer: BC Managed Care – PPO | Attending: Emergency Medicine | Admitting: Emergency Medicine

## 2013-01-07 ENCOUNTER — Emergency Department (HOSPITAL_COMMUNITY): Payer: BC Managed Care – PPO

## 2013-01-07 ENCOUNTER — Encounter (HOSPITAL_COMMUNITY): Payer: Self-pay | Admitting: Emergency Medicine

## 2013-01-07 DIAGNOSIS — E119 Type 2 diabetes mellitus without complications: Secondary | ICD-10-CM | POA: Insufficient documentation

## 2013-01-07 DIAGNOSIS — J189 Pneumonia, unspecified organism: Secondary | ICD-10-CM

## 2013-01-07 DIAGNOSIS — J159 Unspecified bacterial pneumonia: Secondary | ICD-10-CM | POA: Insufficient documentation

## 2013-01-07 DIAGNOSIS — Z87891 Personal history of nicotine dependence: Secondary | ICD-10-CM | POA: Insufficient documentation

## 2013-01-07 DIAGNOSIS — Z8619 Personal history of other infectious and parasitic diseases: Secondary | ICD-10-CM | POA: Insufficient documentation

## 2013-01-07 DIAGNOSIS — Z8719 Personal history of other diseases of the digestive system: Secondary | ICD-10-CM | POA: Insufficient documentation

## 2013-01-07 DIAGNOSIS — Z880 Allergy status to penicillin: Secondary | ICD-10-CM | POA: Insufficient documentation

## 2013-01-07 DIAGNOSIS — F319 Bipolar disorder, unspecified: Secondary | ICD-10-CM | POA: Insufficient documentation

## 2013-01-07 DIAGNOSIS — M79604 Pain in right leg: Secondary | ICD-10-CM

## 2013-01-07 DIAGNOSIS — M79661 Pain in right lower leg: Secondary | ICD-10-CM

## 2013-01-07 DIAGNOSIS — N189 Chronic kidney disease, unspecified: Secondary | ICD-10-CM | POA: Insufficient documentation

## 2013-01-07 DIAGNOSIS — Z8739 Personal history of other diseases of the musculoskeletal system and connective tissue: Secondary | ICD-10-CM | POA: Insufficient documentation

## 2013-01-07 DIAGNOSIS — R42 Dizziness and giddiness: Secondary | ICD-10-CM | POA: Insufficient documentation

## 2013-01-07 DIAGNOSIS — H538 Other visual disturbances: Secondary | ICD-10-CM | POA: Insufficient documentation

## 2013-01-07 DIAGNOSIS — M79609 Pain in unspecified limb: Secondary | ICD-10-CM | POA: Insufficient documentation

## 2013-01-07 DIAGNOSIS — Z79899 Other long term (current) drug therapy: Secondary | ICD-10-CM | POA: Insufficient documentation

## 2013-01-07 LAB — CBC WITH DIFFERENTIAL/PLATELET
Eosinophils Relative: 2 % (ref 0–5)
Hemoglobin: 13.6 g/dL (ref 13.0–17.0)
Lymphocytes Relative: 15 % (ref 12–46)
Lymphs Abs: 2 10*3/uL (ref 0.7–4.0)
MCH: 27.2 pg (ref 26.0–34.0)
Monocytes Relative: 7 % (ref 3–12)
Neutro Abs: 9.8 10*3/uL — ABNORMAL HIGH (ref 1.7–7.7)
Neutrophils Relative %: 76 % (ref 43–77)
RBC: 5 MIL/uL (ref 4.22–5.81)
RDW: 14.5 % (ref 11.5–15.5)
WBC: 12.9 10*3/uL — ABNORMAL HIGH (ref 4.0–10.5)

## 2013-01-07 LAB — COMPREHENSIVE METABOLIC PANEL
ALT: 18 U/L (ref 0–53)
AST: 17 U/L (ref 0–37)
Albumin: 3.2 g/dL — ABNORMAL LOW (ref 3.5–5.2)
Alkaline Phosphatase: 61 U/L (ref 39–117)
BUN: 36 mg/dL — ABNORMAL HIGH (ref 6–23)
CO2: 27 mEq/L (ref 19–32)
Chloride: 102 mEq/L (ref 96–112)
GFR calc Af Amer: 34 mL/min — ABNORMAL LOW (ref >90)
GFR calc non Af Amer: 30 mL/min — ABNORMAL LOW (ref >90)
Glucose, Bld: 94 mg/dL (ref 70–99)
Potassium: 4.7 mEq/L (ref 3.5–5.1)
Total Bilirubin: 0.2 mg/dL — ABNORMAL LOW (ref 0.3–1.2)

## 2013-01-07 LAB — GLUCOSE, CAPILLARY: Glucose-Capillary: 97 mg/dL (ref 70–99)

## 2013-01-07 LAB — VALPROIC ACID LEVEL: Valproic Acid Lvl: 74.3 ug/mL (ref 50.0–100.0)

## 2013-01-07 LAB — TROPONIN I: Troponin I: <0.3 ng/mL (ref <0.30)

## 2013-01-07 MED ORDER — AZITHROMYCIN 250 MG PO TABS: ORAL_TABLET | ORAL | Status: DC

## 2013-01-07 NOTE — ED Notes (Signed)
Pt reports that he has been on an antibiotic for 1 week for cough and congestion.

## 2013-01-07 NOTE — ED Notes (Signed)
Pt c/o intermittent generalized weakness, intermittent bilateral calf pain, and SOB. Pt describes calf pain as "knives in my leg". Pt states "my mouth gets dry and I have no energy to do anything". Pt states symptoms have been present for "several months".

## 2013-01-07 NOTE — ED Provider Notes (Signed)
CSN: [id number]     Arrival date & time 01/07/13  1307 History  This chart was scribed for Geoffery Lyons, MD,  by Ashley Jacobs, ED Scribe. The patient was seen in room APA12/APA12 and the patient's care was started at 2:06 PM.   First MD Initiated Contact with Patient 01/07/13 1404     Chief Complaint  Patient presents with  . Shortness of Breath   (Consider location/radiation/quality/duration/timing/severity/associated sxs/prior Treatment) The history is provided by the patient and medical records. No language interpreter was used.   HPI Comments: Andrew Warren is a 62 y.o. male who presents to the Emergency Department complaining of SOB for the past six months ("or longer"). Pt reports getting episodes when he gets a flash" dryness in his mouth and "the light illuminates". Then he experiencs a "power failure" resulting in a fall due to weakeness (" I can't lift a marsh mellow"). This afternoon he states he the symptoms presented and his bilateral calf muscles cramped intermittently. He reports SOB and a "sharp knife" sensation in his bilateral calves. The pain is worse when he walks. He went to a Nephrologist who advised him to  discontinue his Lithium rx.. Pt has a hx of bipolar disorder , DM and chronic kidney disease.  Pt's PCP is Dr. Selinda Flavin Past Medical History  Diagnosis Date  . Bipolar disorder   . Diabetes mellitus   . S/P colonoscopy Jan 2008    diverticulosis in sigmoid, Dr. Lovell Sheehan  . Adenomatous polyps 2004    per H&P in echart by Dr. Lovell Sheehan  . Rheumatoid arthritis(714.0)   . Genital warts   . Chronic kidney disease   . Diabetes mellitus, type II    Past Surgical History  Procedure Laterality Date  . Varicocele repair    . Appendectomy     Family History  Problem Relation Age of Onset  . Colon cancer Neg Hx   . ADD / ADHD Neg Hx   . Alcohol abuse Neg Hx   . Drug abuse Neg Hx   . Anxiety disorder Neg Hx   . Bipolar disorder Neg Hx   . Depression Neg  Hx   . OCD Neg Hx   . Paranoid behavior Neg Hx   . Sexual abuse Neg Hx   . Physical abuse Neg Hx   . Dementia Mother   . Schizophrenia Father   . Heart disease Maternal Grandfather   . Heart disease Maternal Grandmother   . Heart disease Paternal Grandfather   . Heart disease Paternal Grandmother   . Seizures Sister    History  Substance Use Topics  . Smoking status: Former Games developer  . Smokeless tobacco: Never Used     Comment: Stopped smoking 10 years ago  . Alcohol Use: No    Review of Systems  Eyes: Positive for visual disturbance.  Respiratory: Positive for shortness of breath.   Musculoskeletal: Positive for myalgias.  Neurological: Positive for light-headedness and numbness.    Allergies  Penicillins and Lithium  Home Medications   Current Outpatient Rx  Name  Route  Sig  Dispense  Refill  . cyclobenzaprine (FLEXERIL) 10 MG tablet   Oral   Take 10 mg by mouth 3 (three) times daily as needed for muscle spasms.         . divalproex (DEPAKOTE ER) 500 MG 24 hr tablet   Oral   Take 4 tablets (2,000 mg total) by mouth daily.   120 tablet   3   .  QUEtiapine (SEROQUEL) 100 MG tablet   Oral   Take 1 tablet (100 mg total) by mouth 3 (three) times daily.   90 tablet   2     Take two to three tablets at bedtime   . sitaGLIPtin (JANUVIA) 100 MG tablet   Oral   Take 100 mg by mouth daily. IN the AM           BP 120/76  Pulse 83  Temp(Src) 98.5 F (36.9 C) (Oral)  Resp 20  Ht 5\' 9"  (1.753 m)  Wt 250 lb (113.399 kg)  BMI 36.90 kg/m2  SpO2 95% Physical Exam  Nursing note and vitals reviewed. Constitutional: He is oriented to person, place, and time. He appears well-developed and well-nourished. No distress.  HENT:  Head: Normocephalic and atraumatic.  Mouth/Throat: Oropharynx is clear and moist.  Eyes: Conjunctivae are normal. Pupils are equal, round, and reactive to light. No scleral icterus.  Neck: Neck supple.  Cardiovascular: Normal rate, regular  rhythm, normal heart sounds and intact distal pulses.   No murmur heard. Pulmonary/Chest: Effort normal and breath sounds normal. No stridor. No respiratory distress. He has no wheezes. He has no rales.  Abdominal: Soft. He exhibits no distension. There is no tenderness.  Musculoskeletal: Normal range of motion. He exhibits no edema.  Point calf tenderness to the lower lateral left calf homans sign is absent  No palpable  chord in the popliteal foss  Neurological: He is alert and oriented to person, place, and time.  Skin: Skin is warm and dry. No rash noted.  Psychiatric: He has a normal mood and affect. His behavior is normal.    ED Course  Procedures (including critical care time) DIAGNOSTIC STUDIES: Oxygen Saturation is 95% on room air, normal by my interpretation.    COORDINATION OF CARE: 2:17 PM Discussed course of care with pt . Pt understands and agrees.    Labs Review Labs Reviewed  GLUCOSE, CAPILLARY   Imaging Review No results found.  EKG Interpretation    Date/Time:  Saturday January 07 2013 14:28:14 EST Ventricular Rate:  102 PR Interval:  158 QRS Duration: 78 QT Interval:  324 QTC Calculation: 422 R Axis:   80 Text Interpretation:  Sinus tachycardia Otherwise normal ECG No previous ECGs available Confirmed by DELOS  MD, Maigen Mozingo ([id number]) on 01/07/2013 3:25:46 PM            MDM  No diagnosis found. Patient is a 62 year old male presents with complaints of generalized weakness he reports his been going on for the past 6 months. He also reports episodes of becoming short of breath and having stabbing pains in both calves which occurs occasionally when he ambulates. Workup today reveals and chronic renal insufficiency but normal electrolytes. His EKG and troponin are negative and I strongly doubt a cardiac etiology. His white count of 12.9 along with a nodular infiltrate in the right base on his chest x-ray. I will treat this with Zithromax and I believe he  is stable for discharge to home.  He is also concerned about the stabbing pains he gets in both calves intermittently. I strongly doubt this is a DVT however the patient is concerned that is what he has. Is there is no ultrasound today I will order DVT studies and set him up to return in the morning to have this performed. I also strongly doubt pulmonary embolism.   I personally performed the services described in this documentation, which was scribed in my presence. The  recorded information has been reviewed and is accurate.       Geoffery Lyons, MD 01/07/13 [phone]

## 2013-01-07 NOTE — ED Notes (Signed)
Pt reports "loss of power", "all the saliva goes out of his mouth, then he gets a stabbing pain in his calves", his doctor recently changed some of his meds.  These episodes have been occuring randomly for the last 6 months.

## 2013-01-08 ENCOUNTER — Inpatient Hospital Stay (HOSPITAL_COMMUNITY): Admit: 2013-01-08 | Payer: Self-pay

## 2013-01-08 ENCOUNTER — Ambulatory Visit (HOSPITAL_COMMUNITY)
Admission: RE | Admit: 2013-01-08 | Discharge: 2013-01-08 | Disposition: A | Discharge status: Home / Self Care | Payer: BC Managed Care – PPO | Source: Ambulatory Visit | Attending: Emergency Medicine | Admitting: Emergency Medicine

## 2013-01-08 DIAGNOSIS — M79609 Pain in unspecified limb: Secondary | ICD-10-CM | POA: Insufficient documentation

## 2013-01-08 DIAGNOSIS — M79604 Pain in right leg: Secondary | ICD-10-CM

## 2013-03-06 ENCOUNTER — Ambulatory Visit (INDEPENDENT_AMBULATORY_CARE_PROVIDER_SITE_OTHER): Payer: BC Managed Care – PPO | Admitting: Psychiatry

## 2013-03-06 ENCOUNTER — Encounter (HOSPITAL_COMMUNITY): Payer: Self-pay | Admitting: Psychiatry

## 2013-03-06 VITALS — BP 130/84 | Ht 69.0 in | Wt 263.0 lb

## 2013-03-06 DIAGNOSIS — F319 Bipolar disorder, unspecified: Secondary | ICD-10-CM

## 2013-03-06 MED ORDER — QUETIAPINE FUMARATE 100 MG PO TABS: 200.0000 mg | ORAL_TABLET | Freq: Every day | ORAL | Status: DC

## 2013-03-06 MED ORDER — DIVALPROEX SODIUM ER 500 MG PO TB24
2000.0000 mg | ORAL_TABLET | Freq: Every day | ORAL | Status: DC
Start: 2013-03-06 — End: 2013-05-02

## 2013-03-06 NOTE — Progress Notes (Signed)
Patient ID: Andrew Warren, male   DOB: 1950-09-30, 63 y.o.   MRN: [id number] Patient ID: Andrew Warren, male   DOB: 10-08-1950, 63 y.o.   MRN: [id number] Patient ID: Andrew Warren, male   DOB: 08-17-1950, 63 y.o.   MRN: [id number]    Patient Identification:  Andrew Warren Date of Evaluation:  03/06/2013  Chief Complaint: I think I'm doing better. Chief Complaint  Patient presents with  . Depression  . Manic Behavior  . Follow-up   History of Chief Complaint:   This patient is a 63 year old married white male who lives with his wife in [place]. He has 4 grown children. 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  The patient states that when he was a young adult he was manic and out of control. He was initially misdiagnosed as being paranoid schizophrenic. He was in and out of hospitals proximally 7 times between the ages of 63 and 45. He was eventually more accurately diagnosed as being bipolar and was started on lithium. He did very well on lithium for a number of years and was monitored by his family doctor, Dr. Nadara Mustard in [place].  Several months ago however he began to be very oversedated. Apparently his renal function was declining and his lithium level had risen. He was seen by nephrologist who recommended he get off lithium immediately. Since then his family Dr. put him on low doses of Seroquel and Depakote. 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 Since then he's been on his current doses of medication with some modifications. On 1500 mg of Depakote his blood level is 51. He is on 300 mg of Seroquel with that at bedtime. He is sleeping too soundly to the point of being incontinent at night. He is craving food all the time and has gained about 10 pounds in the last month. He is also type II diabetic and is concerned  about how this will affect his blood sugar. Interestingly hot, since getting off lithium his blood sugars have come down. He's no longer manic or hyperactive and his mood is stable but he doesn't like the side effects of incontinence and weight gain.  The patient returns after 2 months. He states he is doing okay and he feels as if his mood is stable. He's not having any hallucinations or visions. He states that he always has the same vision off ships in the sky when he begins to decompensate. He's not recheck his Depakote level and we can do this today. He's been sleeping 10+ hours a night and would like to cut back the Seroquel little bit I think this is okay if he does it very slowly. HPI Review of Systems  Constitutional:       With change from lithium  HENT:       Allergies  Eyes: Negative.   Respiratory: Negative.   Cardiovascular: Negative.   Gastrointestinal: Negative.   Genitourinary: Positive for enuresis.  Musculoskeletal: Positive for arthralgias and back pain.  Neurological: Negative for light-headedness.  Psychiatric/Behavioral: Negative for suicidal ideas, hallucinations, behavioral problems, confusion, sleep disturbance, self-injury, dysphoric mood and decreased concentration. The patient is not nervous/anxious and is not hyperactive.     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 Patient has one psychiatric hospitalization few months ago when he was traveling [place].  At that time he was not taking lithium and the Seroquel was started 25 mg and Depakote 250 mg.  Patient denies any history of psychosis or paranoia but admitted mania and depression and severe mood swing.  He denies any history of suicidal attempt .    Alcohol and substance use history. 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  Past Medical History:   Past Medical History  Diagnosis Date  . Bipolar disorder   . Diabetes mellitus   . S/P colonoscopy Jan 2008    diverticulosis in sigmoid, Dr. Arnoldo Morale  . Adenomatous polyps 2004    per H&P in echart by Dr. Arnoldo Morale  . Rheumatoid arthritis(714.0)   . Genital warts   . Chronic kidney disease   . Diabetes mellitus, type II    History of Loss of Consciousness:  Yes Seizure History:  No Cardiac History:  No Allergies:   Allergies  Allergen Reactions  . Penicillins Shortness Of Breath    Shortness of breath/ tightning in throat  . Lithium     Caused renal failure   Current Medications:  Current Outpatient Prescriptions  Medication Sig Dispense Refill  . acetaminophen (TYLENOL) 650 MG CR tablet Take 1,300 mg by mouth every 8 (eight) hours as needed for pain.      Marland Kitchen azithromycin (ZITHROMAX Z-PAK) 250 MG tablet 2 po day one, then 1 daily x 4 days  6 tablet  0  . cyclobenzaprine (FLEXERIL) 10 MG tablet Take 10 mg by mouth 3 (three) times daily as needed for muscle spasms.      . divalproex (DEPAKOTE ER) 500 MG 24 hr tablet Take 4 tablets (2,000 mg total) by mouth daily.  120 tablet  3  . metFORMIN (GLUCOPHAGE) 850 MG tablet Take 850 mg by mouth daily.      . QUEtiapine (SEROQUEL) 100 MG tablet Take 2 tablets (200 mg total) by mouth daily.  60 tablet  2  . sitaGLIPtin (JANUVIA) 100 MG tablet Take 100 mg by mouth daily. IN the AM        No current facility-administered medications for this visit.    Social History: Current Place of Residence: [place] [address] Place of Birth: [place], [place] Family Members: Wife Marital Status:  Married Children: 2  Sons: 2  Daughters: 0 Relationships: wife Education:  Dentist Problems/Performance: none Religious Beliefs/Practices: Presbeterian History of Abuse: none Development worker, community; Farming and trucking, Financial trader History:  None. Legal History: incarcerations for safety of self or others Hobbies/Interests: gardening fishing  Family History:   Family History  Problem Relation Age of Onset  . Colon cancer Neg Hx   . ADD / ADHD Neg Hx   . Alcohol abuse Neg Hx   . Drug abuse Neg Hx   . Anxiety disorder Neg Hx   . Bipolar disorder Neg Hx   . Depression Neg Hx   . OCD Neg Hx   . Paranoid behavior Neg Hx   . Sexual abuse Neg Hx   . Physical abuse Neg Hx   . Dementia Mother   . Schizophrenia Father   . Heart disease Maternal Grandfather   . Heart disease Maternal Grandmother   .  Heart disease Paternal Grandfather   . Heart disease Paternal Grandmother   . Seizures Sister     Mental status examination. Patient is obese male who is casually dressed and fairly groomed.  His speech is clear and understandable  He described his mood is good and his affect is mood appropriate but slightly irritable  He denies any active or passive suicidal thoughts or homicidal thoughts.  He denies any auditory or visual hallucination.  There were no tremors or shakes present.  There were no paranoia or delusion obsession present at this time.  His psychomotor activity is within normal limits.  No no flight of idea [place] or any loose association.  He's alert and oriented x3.  His insight judgment and impulse control is okay.  Assessment:   Axis I bipolar disorder1 Axis II deferred Axis III diabetes mellitus, obesity, recent renal failure secondary to lithium Axis IV mild to moderate Axis V 75  Plan The patient will continue Depakote ER 1000 mg each bedtime. He will decrease Seroquel 250 mg each bedtime. We will check a Depakote level today. He will  return to see me in 2 months. He can call at anytime if his bipolar symptoms remarked Time spent 25 minutes.  More than 50% of the time spent in psychoeducation, counseling and coordination of care.  Medical  Decision Making Problem Points:  Established problem, stable/improving (1), Review of last therapy session (1) and Review of psycho-social stressors (1) Data Points:  Review or order clinical lab tests (1) Review of medication regiment & side effects (2) Review of new medications or change in dosage (2)  I certify that outpatient services furnished can reasonably be expected to improve the patient's condition.   Andrew Spiller, MD,

## 2013-03-10 LAB — VALPROIC ACID LEVEL: Valproic Acid Lvl: 63.5 ug/mL (ref 50.0–100.0)

## 2013-05-01 ENCOUNTER — Telehealth (HOSPITAL_COMMUNITY): Payer: Self-pay | Admitting: *Deleted

## 2013-05-02 ENCOUNTER — Ambulatory Visit (HOSPITAL_COMMUNITY): Payer: Self-pay | Admitting: Psychiatry

## 2013-05-02 ENCOUNTER — Other Ambulatory Visit (HOSPITAL_COMMUNITY): Payer: Self-pay | Admitting: Psychiatry

## 2013-05-02 MED ORDER — DIVALPROEX SODIUM ER 500 MG PO TB24: 2000.0000 mg | ORAL_TABLET | Freq: Every day | ORAL | Status: DC

## 2013-05-02 NOTE — Telephone Encounter (Signed)
Med refilled.

## 2013-05-04 ENCOUNTER — Encounter (HOSPITAL_COMMUNITY): Payer: Self-pay | Admitting: Psychiatry

## 2013-05-04 ENCOUNTER — Ambulatory Visit (INDEPENDENT_AMBULATORY_CARE_PROVIDER_SITE_OTHER): Payer: BC Managed Care – PPO | Admitting: Psychiatry

## 2013-05-04 VITALS — BP 140/80 | Ht 69.0 in | Wt 260.0 lb

## 2013-05-04 DIAGNOSIS — F319 Bipolar disorder, unspecified: Secondary | ICD-10-CM

## 2013-05-04 LAB — CBC WITH DIFFERENTIAL/PLATELET
Basophils Absolute: 0 10*3/uL (ref 0.0–0.1)
Basophils Relative: 0 % (ref 0–1)
Eosinophils Absolute: 0.3 10*3/uL (ref 0.0–0.7)
Eosinophils Relative: 3 % (ref 0–5)
HEMATOCRIT: 42.5 % (ref 39.0–52.0)
Hemoglobin: 13.8 g/dL (ref 13.0–17.0)
LYMPHS ABS: 1.8 10*3/uL (ref 0.7–4.0)
LYMPHS PCT: 20 % (ref 12–46)
MCH: 27 pg (ref 26.0–34.0)
MCHC: 32.5 g/dL (ref 30.0–36.0)
MCV: 83 fL (ref 78.0–100.0)
MONO ABS: 0.9 10*3/uL (ref 0.1–1.0)
Monocytes Relative: 10 % (ref 3–12)
Neutro Abs: 6 10*3/uL (ref 1.7–7.7)
Neutrophils Relative %: 67 % (ref 43–77)
Platelets: 183 10*3/uL (ref 150–400)
RBC: 5.12 MIL/uL (ref 4.22–5.81)
RDW: 16.1 % — AB (ref 11.5–15.5)
WBC: 8.9 10*3/uL (ref 4.0–10.5)

## 2013-05-04 MED ORDER — DIVALPROEX SODIUM ER 500 MG PO TB24: 2000.0000 mg | ORAL_TABLET | Freq: Every day | ORAL | Status: DC

## 2013-05-04 MED ORDER — QUETIAPINE FUMARATE 300 MG PO TABS: ORAL_TABLET | ORAL | Status: DC

## 2013-05-04 NOTE — Progress Notes (Signed)
Patient ID: MCGREGOR TINNON, male   DOB: Nov 16, 1950, 63 y.o.   MRN: [id number] Patient ID: KEEVAN WOLZ, male   DOB: [birth year], 63 y.o.   MRN: [id number] Patient ID: DAEJON LICH, male   DOB: Aug 08, 1950, 63 y.o.   MRN: [id number] Patient ID: EUELL SCHIFF, male   DOB: [birth year], 63 y.o.   MRN: [id number]    Patient Identification:  TAMIR WALLMAN Date of Evaluation:  05/04/2013  Chief Complaint: I think I'm doing better. Chief Complaint  Patient presents with  . Depression  . Manic Behavior  . Follow-up   History of Chief Complaint:   This patient is a 63 year old married white male who lives with his wife in [place]. He has 4 grown children. 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  The patient states that when he was a young adult he was manic and out of control. He was initially misdiagnosed as being paranoid schizophrenic. He was in and out of hospitals proximally 7 times between the ages of 26 and 64. He was eventually more accurately diagnosed as being bipolar and was started on lithium. He did very well on lithium for a number of years and was monitored by his family doctor, Dr. Nadara Mustard in [place].  Several months ago however he began to be very oversedated. Apparently his renal function was declining and his lithium level had risen. He was seen by nephrologist who recommended he get off lithium immediately. Since then his family Dr. put him on low doses of Seroquel and Depakote. 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 Since then he's been on his current doses of medication with some modifications. On 1500 mg of Depakote his blood level is 51. He is on 300 mg of Seroquel with that at bedtime. He is sleeping too soundly to the point of being incontinent at night. He is craving food all the time and has  gained about 10 pounds in the last month. He is also type II diabetic and is concerned about how this will affect his blood sugar. Interestingly hot, since getting off lithium his blood sugars have come down. He's no longer manic or hyperactive and his mood is stable but he doesn't like the side effects of incontinence and weight gain.  The patient returns after 2 months. He states he is doing okay and he feels as if his mood is stable. He's not having any hallucinations or visions. He has cut the Seroquel down to 150 mg at bedtime. He is still calm and sleeping about 8 hours a night but he is not overly drowsy like he was before. His last Depakote level was 63. He continues to work. He talked about difficulties with his wife particularly his sexual. She has not wanted to be sexual since she had breast cancer several years ago. 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 I suggested they talk to each other about this and try to develop a plan how to become intimate again.  He also mentioned that he is bruising easily. I told him we would check a CBC today and if anything is wrong will forward this to his primary Dr. Antionette Poles HPI Review of Systems  Constitutional:       With change from lithium  HENT:       Allergies  Eyes: Negative.  Respiratory: Negative.   Cardiovascular: Negative.   Gastrointestinal: Negative.   Genitourinary: Positive for enuresis.  Musculoskeletal: Positive for arthralgias and back pain.  Neurological: Negative for light-headedness.  Psychiatric/Behavioral: Negative for suicidal ideas, hallucinations, behavioral problems, confusion, sleep disturbance, self-injury, dysphoric mood and decreased concentration. The patient is not nervous/anxious and is not hyperactive.     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 Patient  has one psychiatric hospitalization few months ago when he was traveling [place].  At that time he was not taking lithium and the Seroquel was started 25 mg and Depakote 250 mg.  Patient denies any history of psychosis or paranoia but admitted mania and depression and severe mood swing.  He denies any history of suicidal attempt .    Alcohol and substance use history. 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  Past Medical History:   Past Medical History  Diagnosis Date  . Bipolar disorder   . Diabetes mellitus   . S/P colonoscopy Jan 2008    diverticulosis in sigmoid, Dr. Arnoldo Morale  . Adenomatous polyps 2004    per H&P in echart by Dr. Arnoldo Morale  . Rheumatoid arthritis(714.0)   . Genital warts   . Chronic kidney disease   . Diabetes mellitus, type II    History of Loss of Consciousness:  Yes Seizure History:  No Cardiac History:  No Allergies:   Allergies  Allergen Reactions  . Penicillins Shortness Of Breath    Shortness of breath/ tightning in throat  . Lithium     Caused renal failure   Current Medications:  Current Outpatient Prescriptions  Medication Sig Dispense Refill  . acetaminophen (TYLENOL) 650 MG CR tablet Take 1,300 mg by mouth every 8 (eight) hours as needed for pain.      Marland Kitchen azithromycin (ZITHROMAX Z-PAK) 250 MG tablet 2 po day one, then 1 daily x 4 days  6 tablet  0  . cyclobenzaprine (FLEXERIL) 10 MG tablet Take 10 mg by mouth 3 (three) times daily as needed for muscle spasms.      . divalproex (DEPAKOTE ER) 500 MG 24 hr tablet Take 4 tablets (2,000 mg total) by mouth daily.  360 tablet  3  . metFORMIN (GLUCOPHAGE) 850 MG tablet Take 850 mg by mouth daily.      . QUEtiapine (SEROQUEL) 300 MG tablet Take one half tablet at bedtime  90 tablet  4  . sitaGLIPtin (JANUVIA) 100 MG tablet Take 100 mg by mouth daily. IN the AM        No current  facility-administered medications for this visit.    Social History: Current Place of Residence: [place] [address] Place of Birth: [place], [place] Family Members: Wife Marital Status:  Married Children: 2  Sons: 2  Daughters: 0 Relationships: wife Education:  Dentist Problems/Performance: none Religious Beliefs/Practices: Presbeterian History of Abuse: none Pensions consultant; Farming and trucking, Financial trader History:  None. Legal History: incarcerations for safety of self or others Hobbies/Interests: gardening fishing  Family History:   Family History  Problem Relation Age of Onset  . Colon cancer Neg Hx   . ADD / ADHD Neg Hx   . Alcohol abuse Neg Hx   .  Drug abuse Neg Hx   . Anxiety disorder Neg Hx   . Bipolar disorder Neg Hx   . Depression Neg Hx   . OCD Neg Hx   . Paranoid behavior Neg Hx   . Sexual abuse Neg Hx   . Physical abuse Neg Hx   . Dementia Mother   . Schizophrenia Father   . Heart disease Maternal Grandfather   . Heart disease Maternal Grandmother   . Heart disease Paternal Grandfather   . Heart disease Paternal Grandmother   . Seizures Sister     Mental status examination. Patient is obese male who is casually dressed and fairly groomed.  His speech is clear and understandable  He described his mood is good and his affect is mood appropriate but slightly irritable  He denies any active or passive suicidal thoughts or homicidal thoughts.  He denies any auditory or visual hallucination.  There were no tremors or shakes present.  There were no paranoia or delusion obsession present at this time.  His psychomotor activity is within normal limits.  No no flight of idea [place] or any loose association.  He's alert and oriented x3.  His insight judgment and impulse control is okay.  Assessment:   Axis I bipolar disorder1 Axis II deferred Axis III diabetes mellitus, obesity, recent renal failure secondary  to lithium Axis IV mild to moderate Axis V 75  Plan The patient will continue Depakote ER 1000 mg each bedtime. An Seroquel 150 mg each bedtime. We will check a CBC with diff He will  return to see me in 3 months. He can call at anytime if his bipolar symptoms remarked Time spent 25 minutes.  More than 50% of the time spent in psychoeducation, counseling and coordination of care.  Medical Decision Making Problem Points:  Established problem, stable/improving (1), Review of last therapy session (1) and Review of psycho-social stressors (1) Data Points:  Review or order clinical lab tests (1) Review of medication regiment & side effects (2) Review of new medications or change in dosage (2)  I certify that outpatient services furnished can reasonably be expected to improve the patient's condition.   Levonne Spiller, MD,

## 2013-07-06 ENCOUNTER — Telehealth (HOSPITAL_COMMUNITY): Payer: Self-pay | Admitting: *Deleted

## 2013-07-06 NOTE — Telephone Encounter (Signed)
Told patient he must see his PCP asap. He wanted to argue about this but I told him it was mandatory

## 2013-08-04 ENCOUNTER — Ambulatory Visit (HOSPITAL_COMMUNITY): Payer: Self-pay | Admitting: Psychiatry

## 2013-08-04 ENCOUNTER — Encounter (HOSPITAL_COMMUNITY): Payer: Self-pay | Admitting: Psychiatry

## 2013-08-22 ENCOUNTER — Ambulatory Visit (INDEPENDENT_AMBULATORY_CARE_PROVIDER_SITE_OTHER): Payer: BC Managed Care – PPO | Admitting: Psychiatry

## 2013-08-22 ENCOUNTER — Encounter (HOSPITAL_COMMUNITY): Payer: Self-pay | Admitting: Psychiatry

## 2013-08-22 VITALS — BP 120/80 | Ht 69.0 in | Wt 252.0 lb

## 2013-08-22 DIAGNOSIS — F3174 Bipolar disorder, in full remission, most recent episode manic: Secondary | ICD-10-CM

## 2013-08-22 MED ORDER — QUETIAPINE FUMARATE 100 MG PO TABS: ORAL_TABLET | ORAL | Status: DC

## 2013-08-22 MED ORDER — DIVALPROEX SODIUM ER 500 MG PO TB24
1500.0000 mg | ORAL_TABLET | Freq: Every day | ORAL | Status: DC
Start: 2013-08-22 — End: 2015-01-25

## 2013-08-22 NOTE — Progress Notes (Signed)
Patient ID: Andrew Warren, male   DOB: [birth year], 63 y.o.   MRN: [id number] Patient ID: JOHNWILLIAM Warren, male   DOB: 12-22-1950, 63 y.o.   MRN: [id number] Patient ID: Andrew Warren, male   DOB: Oct 20, 1950, 63 y.o.   MRN: [id number] Patient ID: Andrew Warren, male   DOB: 09/19/1950, 63 y.o.   MRN: [id number] Patient ID: Andrew Warren, male   DOB: 08/27/1950, 63 y.o.   MRN: [id number]    Patient Identification:  Andrew Warren Date of Evaluation:  08/22/2013  Chief Complaint: I think I'm doing better. Chief Complaint  Patient presents with  . Depression  . Manic Behavior  . Follow-up   History of Chief Complaint:   This patient is a 63 year old married white male who lives with his wife in [place]. He has 4 grown children. 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  The patient states that when he was a young adult he was manic and out of control. He was initially misdiagnosed as being paranoid schizophrenic. He was in and out of hospitals proximally 7 times between the ages of 84 and 34. He was eventually more accurately diagnosed as being bipolar and was started on lithium. He did very well on lithium for a number of years and was monitored by his family doctor, Dr. Nadara Mustard in [place].  Several months ago however he began to be very oversedated. Apparently his renal function was declining and his lithium level had risen. He was seen by nephrologist who recommended he get off lithium immediately. Since then his family Dr. put him on low doses of Seroquel and Depakote. The medication was not enough and he got increasingly manic and hyperactive and during one of his trucking runs he ended up hospitalized in [place]. Since then he's been on his current doses of medication with some modifications. On 1500 mg of Depakote his blood level is 51. He is on 300 mg of Seroquel with that at bedtime. He is sleeping too soundly to the  point of being incontinent at night. He is craving food all the time and has gained about 10 pounds in the last month. He is also type II diabetic and is concerned about how this will affect his blood sugar. Interestingly hot, since getting off lithium his blood sugars have come down. He's no longer manic or hyperactive and his mood is stable but he doesn't like the side effects of incontinence and weight gain.  The patient returns after 3 months. He called me about a month ago stating that he was falling a lot. I strongly suggested he see his primary Dr. He found that he was having an interaction between some of his diabetic medicines and he is stop glipizide. He feels oversedated and drowsy all day and his hands are shaking a lot. He recently had a Depakote level which was 95 in the therapeutic range as Seroquel level which was 39. I told him we could slightly decrease his Depakote and Seroquel to see if he would be less drowsy HPI Review of Systems  Constitutional:       With change from lithium  HENT:       Allergies  Eyes: Negative.   Respiratory: Negative.   Cardiovascular: Negative.   Gastrointestinal: Negative.   Genitourinary: Positive for enuresis.  Musculoskeletal: Positive for arthralgias and back pain.  Neurological: Negative for light-headedness.  Psychiatric/Behavioral: Negative for suicidal ideas, hallucinations, behavioral  problems, confusion, sleep disturbance, self-injury, dysphoric mood and decreased concentration. The patient is not nervous/anxious and is not hyperactive.     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 Patient has one psychiatric hospitalization few months ago when he was traveling [place].  At that time he was not taking lithium and the Seroquel was started 25 mg and Depakote 250 mg.  Patient  denies any history of psychosis or paranoia but admitted mania and depression and severe mood swing.  He denies any history of suicidal attempt .    Alcohol and substance use history. 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  Past Medical History:   Past Medical History  Diagnosis Date  . Bipolar disorder   . Diabetes mellitus   . S/P colonoscopy Jan 2008    diverticulosis in sigmoid, Dr. Arnoldo Morale  . Adenomatous polyps 2004    per H&P in echart by Dr. Arnoldo Morale  . Rheumatoid arthritis(714.0)   . Genital warts   . Chronic kidney disease   . Diabetes mellitus, type II    History of Loss of Consciousness:  Yes Seizure History:  No Cardiac History:  No Allergies:   Allergies  Allergen Reactions  . Penicillins Shortness Of Breath    Shortness of breath/ tightning in throat  . Lithium     Caused renal failure   Current Medications:  Current Outpatient Prescriptions  Medication Sig Dispense Refill  . sulfamethoxazole-trimethoprim (BACTRIM DS) 800-160 MG per tablet Take 1 tablet by mouth 2 (two) times daily.      . tamsulosin (FLOMAX) 0.4 MG CAPS capsule Take 0.4 mg by mouth daily.      Marland Kitchen acetaminophen (TYLENOL) 650 MG CR tablet Take 1,300 mg by mouth every 8 (eight) hours as needed for pain.      . divalproex (DEPAKOTE ER) 500 MG 24 hr tablet Take 3 tablets (1,500 mg total) by mouth daily.  270 tablet  3  . QUEtiapine (SEROQUEL) 100 MG tablet Take one or two at bedtime  180 tablet  2  . sitaGLIPtin (JANUVIA) 100 MG tablet Take 100 mg by mouth daily. IN the AM        No current facility-administered medications for this visit.    Social History: Current Place of Residence: [place] [address] Place of Birth: [place], [place] Family Members: Wife Marital Status:  Married Children: 2  Sons: 2  Daughters: 0 Relationships: wife Education:   Dentist Problems/Performance: none Religious Beliefs/Practices: [employer]beterian History of Abuse: none Pensions consultant; Farming and trucking, Financial trader History:  None. Legal History: incarcerations for safety of self or others Hobbies/Interests: gardening fishing  Family History:   Family History  Problem Relation Age of Onset  . Colon cancer Neg Hx   . ADD / ADHD Neg Hx   . Alcohol abuse Neg Hx   . Drug abuse Neg Hx   . Anxiety disorder Neg Hx   . Bipolar disorder Neg Hx   . Depression Neg Hx   . OCD Neg Hx   . Paranoid behavior Neg Hx   . Sexual abuse Neg Hx   . Physical abuse Neg Hx   . Dementia Mother   . Schizophrenia  Father   . Heart disease Maternal Grandfather   . Heart disease Maternal Grandmother   . Heart disease Paternal Grandfather   . Heart disease Paternal Grandmother   . Seizures Sister     Mental status examination. Patient is obese male who is casually dressed and fairly groomed.  His speech is clear and understandable  He described his mood is a bit low and he has low motivation and his affect is  slightly irritable  He denies any active or passive suicidal thoughts or homicidal thoughts.  He denies any auditory or visual hallucination.  He has a resting tremor in both hands.  There were no paranoia or delusion obsession present at this time.  His psychomotor activity is within normal limits.  No  flight of idea [place] or any loose association.  He's alert and oriented x3.  His insight judgment and impulse control is okay.  Assessment:   Axis I bipolar disorder1 Axis II deferred Axis III diabetes mellitus, obesity, recent renal failure secondary to lithium Axis IV mild to moderate Axis V 75  Plan The patient will decrease Depakote ER from 2000 mg to 1500 mg at bedtime. He will decrease Seroquel from 150-100 mg at bedtime. He'll return in 6 weeks but call if manic or depressive symptoms worsen spent 25 minutes.  More than 50%  of the time spent in psychoeducation, counseling and coordination of care.  Medical Decision Making Problem Points:  Established problem, stable/improving (1), Review of last therapy session (1) and Review of psycho-social stressors (1) Data Points:  Review or order clinical lab tests (1) Review of medication regiment & side effects (2) Review of new medications or change in dosage (2)  I certify that outpatient services furnished can reasonably be expected to improve the patient's condition.   Levonne Spiller, MD,

## 2014-03-26 ENCOUNTER — Observation Stay (HOSPITAL_COMMUNITY)
Admission: EM | Admit: 2014-03-26 | Discharge: 2014-03-27 | Disposition: A | Discharge status: Home / Self Care | Payer: BLUE CROSS/BLUE SHIELD | Attending: Internal Medicine | Admitting: Internal Medicine

## 2014-03-26 ENCOUNTER — Encounter (HOSPITAL_COMMUNITY): Payer: Self-pay | Admitting: Emergency Medicine

## 2014-03-26 ENCOUNTER — Emergency Department (HOSPITAL_COMMUNITY): Payer: BLUE CROSS/BLUE SHIELD

## 2014-03-26 DIAGNOSIS — Z87891 Personal history of nicotine dependence: Secondary | ICD-10-CM | POA: Diagnosis not present

## 2014-03-26 DIAGNOSIS — R0789 Other chest pain: Secondary | ICD-10-CM

## 2014-03-26 DIAGNOSIS — Z8619 Personal history of other infectious and parasitic diseases: Secondary | ICD-10-CM | POA: Insufficient documentation

## 2014-03-26 DIAGNOSIS — Z880 Allergy status to penicillin: Secondary | ICD-10-CM | POA: Diagnosis not present

## 2014-03-26 DIAGNOSIS — R079 Chest pain, unspecified: Principal | ICD-10-CM | POA: Diagnosis present

## 2014-03-26 DIAGNOSIS — E7800 Pure hypercholesterolemia, unspecified: Secondary | ICD-10-CM | POA: Diagnosis present

## 2014-03-26 DIAGNOSIS — Z79899 Other long term (current) drug therapy: Secondary | ICD-10-CM | POA: Diagnosis not present

## 2014-03-26 DIAGNOSIS — N183 Chronic kidney disease, stage 3 (moderate): Secondary | ICD-10-CM

## 2014-03-26 DIAGNOSIS — M069 Rheumatoid arthritis, unspecified: Secondary | ICD-10-CM | POA: Diagnosis not present

## 2014-03-26 DIAGNOSIS — E118 Type 2 diabetes mellitus with unspecified complications: Secondary | ICD-10-CM

## 2014-03-26 DIAGNOSIS — Z6836 Body mass index (BMI) 36.0-36.9, adult: Secondary | ICD-10-CM | POA: Diagnosis present

## 2014-03-26 DIAGNOSIS — N189 Chronic kidney disease, unspecified: Secondary | ICD-10-CM | POA: Diagnosis not present

## 2014-03-26 DIAGNOSIS — E119 Type 2 diabetes mellitus without complications: Secondary | ICD-10-CM | POA: Diagnosis not present

## 2014-03-26 DIAGNOSIS — Z9889 Other specified postprocedural states: Secondary | ICD-10-CM | POA: Diagnosis not present

## 2014-03-26 DIAGNOSIS — F319 Bipolar disorder, unspecified: Secondary | ICD-10-CM | POA: Diagnosis not present

## 2014-03-26 DIAGNOSIS — N400 Enlarged prostate without lower urinary tract symptoms: Secondary | ICD-10-CM | POA: Diagnosis not present

## 2014-03-26 DIAGNOSIS — Z86018 Personal history of other benign neoplasm: Secondary | ICD-10-CM | POA: Insufficient documentation

## 2014-03-26 HISTORY — DX: Pure hypercholesterolemia, unspecified: E78.00

## 2014-03-26 HISTORY — DX: Benign prostatic hyperplasia without lower urinary tract symptoms: N40.0

## 2014-03-26 LAB — CBC WITH DIFFERENTIAL/PLATELET
Basophils Absolute: 0 10*3/uL (ref 0.0–0.1)
Basophils Relative: 0 % (ref 0–1)
EOS ABS: 0.2 10*3/uL (ref 0.0–0.7)
EOS PCT: 2 % (ref 0–5)
HEMATOCRIT: 43.4 % (ref 39.0–52.0)
Hemoglobin: 13.6 g/dL (ref 13.0–17.0)
LYMPHS ABS: 1.6 10*3/uL (ref 0.7–4.0)
Lymphocytes Relative: 20 % (ref 12–46)
MCH: 27.3 pg (ref 26.0–34.0)
MCHC: 31.3 g/dL (ref 30.0–36.0)
MCV: 87.1 fL (ref 78.0–100.0)
Monocytes Absolute: 0.7 10*3/uL (ref 0.1–1.0)
Monocytes Relative: 8 % (ref 3–12)
Neutro Abs: 5.5 10*3/uL (ref 1.7–7.7)
Neutrophils Relative %: 70 % (ref 43–77)
PLATELETS: 168 10*3/uL (ref 150–400)
RBC: 4.98 MIL/uL (ref 4.22–5.81)
RDW: 15.2 % (ref 11.5–15.5)
WBC: 7.9 10*3/uL (ref 4.0–10.5)

## 2014-03-26 LAB — COMPREHENSIVE METABOLIC PANEL
ALK PHOS: 55 U/L (ref 39–117)
ALT: 14 U/L (ref 0–53)
ANION GAP: 3 — AB (ref 5–15)
AST: 17 U/L (ref 0–37)
Albumin: 3.3 g/dL — ABNORMAL LOW (ref 3.5–5.2)
BUN: 24 mg/dL — ABNORMAL HIGH (ref 6–23)
CHLORIDE: 108 mmol/L (ref 96–112)
CO2: 30 mmol/L (ref 19–32)
Calcium: 8.9 mg/dL (ref 8.4–10.5)
Creatinine, Ser: 1.71 mg/dL — ABNORMAL HIGH (ref 0.50–1.35)
GFR calc Af Amer: 47 mL/min — ABNORMAL LOW (ref >90)
GFR calc non Af Amer: 41 mL/min — ABNORMAL LOW (ref >90)
GLUCOSE: 149 mg/dL — AB (ref 70–99)
Potassium: 4.8 mmol/L (ref 3.5–5.1)
Sodium: 141 mmol/L (ref 135–145)
Total Bilirubin: 0.4 mg/dL (ref 0.3–1.2)
Total Protein: 6.4 g/dL (ref 6.0–8.3)

## 2014-03-26 LAB — TSH: TSH: 2.251 u[IU]/mL (ref 0.350–4.500)

## 2014-03-26 LAB — GLUCOSE, CAPILLARY
GLUCOSE-CAPILLARY: 176 mg/dL — AB (ref 70–99)
Glucose-Capillary: 158 mg/dL — ABNORMAL HIGH (ref 70–99)

## 2014-03-26 LAB — LIPID PANEL
CHOL/HDL RATIO: 6.5 ratio
CHOLESTEROL: 189 mg/dL (ref 0–200)
HDL: 29 mg/dL — ABNORMAL LOW (ref >39)
LDL Cholesterol: 100 mg/dL — ABNORMAL HIGH (ref 0–99)
Triglycerides: 299 mg/dL — ABNORMAL HIGH (ref <150)
VLDL: 60 mg/dL — ABNORMAL HIGH (ref 0–40)

## 2014-03-26 LAB — TROPONIN I: Troponin I: <0.03 ng/mL (ref <0.031)

## 2014-03-26 LAB — VALPROIC ACID LEVEL: Valproic Acid Lvl: 61.4 ug/mL (ref 50.0–100.0)

## 2014-03-26 MED ORDER — QUETIAPINE FUMARATE 100 MG PO TABS
200.0000 mg | ORAL_TABLET | Freq: Every day | ORAL | Status: DC
  Administered 2014-03-26: 200 mg via ORAL
  Filled 2014-03-26: qty 2

## 2014-03-26 MED ORDER — HEPARIN SODIUM (PORCINE) 5000 UNIT/ML IJ SOLN
5000.0000 [IU] | Freq: Three times a day (TID) | INTRAMUSCULAR | Status: DC
  Administered 2014-03-26 – 2014-03-27 (×2): 5000 [IU] via SUBCUTANEOUS
  Filled 2014-03-26 (×3): qty 1

## 2014-03-26 MED ORDER — ASPIRIN 81 MG PO CHEW
324.0000 mg | CHEWABLE_TABLET | Freq: Once | ORAL | Status: AC
  Administered 2014-03-26: 324 mg via ORAL
  Filled 2014-03-26: qty 4

## 2014-03-26 MED ORDER — ONDANSETRON HCL 4 MG/2ML IJ SOLN: 4.0000 mg | Freq: Four times a day (QID) | INTRAMUSCULAR | Status: DC | PRN

## 2014-03-26 MED ORDER — DIVALPROEX SODIUM ER 500 MG PO TB24
1500.0000 mg | ORAL_TABLET | Freq: Every day | ORAL | Status: DC
  Filled 2014-03-26: qty 3

## 2014-03-26 MED ORDER — MORPHINE SULFATE 2 MG/ML IJ SOLN: 1.0000 mg | INTRAMUSCULAR | Status: DC | PRN

## 2014-03-26 MED ORDER — ONDANSETRON HCL 4 MG PO TABS: 4.0000 mg | ORAL_TABLET | Freq: Four times a day (QID) | ORAL | Status: DC | PRN

## 2014-03-26 MED ORDER — ASPIRIN EC 325 MG PO TBEC
325.0000 mg | DELAYED_RELEASE_TABLET | Freq: Every day | ORAL | Status: DC
  Administered 2014-03-27: 325 mg via ORAL
  Filled 2014-03-26: qty 1

## 2014-03-26 MED ORDER — INSULIN ASPART 100 UNIT/ML ~~LOC~~ SOLN
0.0000 [IU] | Freq: Three times a day (TID) | SUBCUTANEOUS | Status: DC
  Administered 2014-03-26 – 2014-03-27 (×2): 3 [IU] via SUBCUTANEOUS

## 2014-03-26 MED ORDER — ACETAMINOPHEN 325 MG PO TABS: 650.0000 mg | ORAL_TABLET | Freq: Four times a day (QID) | ORAL | Status: DC | PRN

## 2014-03-26 MED ORDER — TAMSULOSIN HCL 0.4 MG PO CAPS
0.4000 mg | ORAL_CAPSULE | Freq: Every day | ORAL | Status: DC
  Administered 2014-03-26 – 2014-03-27 (×2): 0.4 mg via ORAL
  Filled 2014-03-26 (×2): qty 1

## 2014-03-26 MED ORDER — ACETAMINOPHEN 650 MG RE SUPP: 650.0000 mg | Freq: Four times a day (QID) | RECTAL | Status: DC | PRN

## 2014-03-26 MED ORDER — SODIUM CHLORIDE 0.9 % IJ SOLN
3.0000 mL | Freq: Two times a day (BID) | INTRAMUSCULAR | Status: DC
  Administered 2014-03-26 – 2014-03-27 (×3): 3 mL via INTRAVENOUS

## 2014-03-26 MED ORDER — HYDROCODONE-ACETAMINOPHEN 5-325 MG PO TABS: 1.0000 | ORAL_TABLET | ORAL | Status: DC | PRN

## 2014-03-26 MED ORDER — BISACODYL 5 MG PO TBEC: 5.0000 mg | DELAYED_RELEASE_TABLET | Freq: Every day | ORAL | Status: DC | PRN

## 2014-03-26 MED ORDER — ALUM & MAG HYDROXIDE-SIMETH 200-200-20 MG/5ML PO SUSP: 30.0000 mL | Freq: Four times a day (QID) | ORAL | Status: DC | PRN

## 2014-03-26 MED ORDER — GLIPIZIDE ER 5 MG PO TB24
10.0000 mg | ORAL_TABLET | Freq: Two times a day (BID) | ORAL | Status: DC
  Administered 2014-03-26: 10 mg via ORAL
  Filled 2014-03-26 (×2): qty 2

## 2014-03-26 MED ORDER — POLYETHYLENE GLYCOL 3350 17 G PO PACK: 17.0000 g | PACK | Freq: Every day | ORAL | Status: DC | PRN

## 2014-03-26 MED ORDER — NITROGLYCERIN 0.4 MG SL SUBL
0.4000 mg | SUBLINGUAL_TABLET | SUBLINGUAL | Status: DC | PRN
  Administered 2014-03-26: 0.4 mg via SUBLINGUAL
  Filled 2014-03-26: qty 1

## 2014-03-26 MED ORDER — LINAGLIPTIN 5 MG PO TABS
5.0000 mg | ORAL_TABLET | Freq: Every day | ORAL | Status: DC
  Filled 2014-03-26: qty 1

## 2014-03-26 MED ORDER — LISINOPRIL 5 MG PO TABS
5.0000 mg | ORAL_TABLET | Freq: Every day | ORAL | Status: DC
Start: 2014-03-26 — End: 2014-03-27
  Administered 2014-03-27: 5 mg via ORAL
  Filled 2014-03-26 (×2): qty 1

## 2014-03-26 MED ORDER — DIVALPROEX SODIUM ER 500 MG PO TB24
1500.0000 mg | ORAL_TABLET | Freq: Every day | ORAL | Status: DC
  Administered 2014-03-26: 1500 mg via ORAL
  Filled 2014-03-26: qty 3

## 2014-03-26 NOTE — H&P (Signed)
[facility] History and Physical  BEN HABERMANN GUY:[id number] DOB: 1950/03/20 DOA: 03/26/2014  Referring physician:  PCP: [name], MD   Chief Complaint: chest pain at rest  HPI: Andrew Warren is a 64 y.o. male past medical hx DM, CKD, high cholesterol, obesity, bipolar affective disorder presents to ED with CC persistent CP. Reports seeing PCP last week and lab work indicated high cholesterol and high A1c. Medications were changed but only detailed med he remembers is being started on Cialis. He had 2 doses and developed substernal chest "tightness". He read the medication insert and noticed chest tightness was a side effect. He stopped taking medication. The pain continued. Pain described as intermittent and only with movement. Denied any diaphoresis, palpitations, sob, headache, syncope or near syncope. Denies coughing, LE edema or orthopnea.  Denies recent fever chills sick contact. No dysuria hematuria, diarrhea.   In ED initial troponin negative and EKG without acute changes. He is given NTG with good relief. He is afebrile, hemodynamically stable and not hypoxic.     Review of Systems:  10 point review of systems complete and all systems are negative except as indicated in the history of present illness  Past Medical History  Diagnosis Date  . Bipolar disorder   . Diabetes mellitus   . S/P colonoscopy Jan 2008    diverticulosis in sigmoid, Dr. Arnoldo Morale  . Adenomatous polyps 2004    per H&P in echart by Dr. Arnoldo Morale  . Rheumatoid arthritis(714.0)   . Genital warts   . Chronic kidney disease   . Diabetes mellitus, type II   . BPH (benign prostatic hyperplasia)   . High cholesterol    Past Surgical History  Procedure Laterality Date  . Varicocele repair    . Appendectomy     Social History:  reports that he quit smoking about 8 years ago. His smoking use included Cigarettes. He has a 90 pack-year smoking history. He has never used smokeless tobacco. He  reports that he does not drink alcohol or use illicit drugs. 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 He's married and lives at home with his wife. He is independent with ADLs Allergies  Allergen Reactions  . Penicillins Shortness Of Breath    Shortness of breath/ tightning in throat  . Lithium     Caused renal failure    Family History  Problem Relation Age of Onset  . Colon cancer Neg Hx   . ADD / ADHD Neg Hx   . Alcohol abuse Neg Hx   . Drug abuse Neg Hx   . Anxiety disorder Neg Hx   . Bipolar disorder Neg Hx   . Depression Neg Hx   . OCD Neg Hx   . Paranoid behavior Neg Hx   . Sexual abuse Neg Hx   . Physical abuse Neg Hx   . Dementia Mother   . Schizophrenia Father   . Heart disease Maternal Grandfather   . Heart disease Maternal Grandmother   . Heart disease Paternal Grandfather   . Heart disease Paternal Grandmother   . Seizures Sister      Prior to Admission medications   Medication Sig Start Date End Date Taking? Authorizing Provider  acetaminophen (TYLENOL) 650 MG CR tablet Take 1,300 mg by mouth every 8 (eight) hours as needed for pain.   Yes Historical Provider, MD  divalproex (DEPAKOTE ER) 500 MG 24 hr tablet Take 3 tablets (1,500 mg total) by mouth daily. 08/22/13  Yes  Levonne Spiller, MD  glipiZIDE (GLUCOTROL XL) 10 MG 24 hr tablet Take 10 mg by mouth 2 (two) times daily.   Yes Historical Provider, MD  lisinopril (PRINIVIL,ZESTRIL) 5 MG tablet Take 5 mg by mouth daily.   Yes Historical Provider, MD  QUEtiapine (SEROQUEL) 100 MG tablet Take one or two at bedtime 08/22/13  Yes Levonne Spiller, MD  sitaGLIPtin (JANUVIA) 100 MG tablet Take 100 mg by mouth daily. IN the AM    Yes Historical Provider, MD  tamsulosin (FLOMAX) 0.4 MG CAPS capsule Take 0.4 mg by mouth daily.   Yes Historical Provider, MD   Physical Exam: Filed Vitals:   03/26/14 1130 03/26/14 1145 03/26/14 1231 03/26/14 1313  BP: 109/94  112/66 114/73  Pulse:  87 87 82  Temp:   98.3 F (36.8  C) 98.6 F (37 C)  TempSrc:   Oral Oral  Resp:  20 17 18   Height:    5\' 8"  (1.727 m)  Weight:    114.76 kg (253 lb)  SpO2:  95% 100% 100%    Wt Readings from Last 3 Encounters:  03/26/14 114.76 kg (253 lb)  08/22/13 114.306 kg (252 lb)  05/04/13 117.935 kg (260 lb)    General:  Appears calm and comfortable, obese Eyes: PERRL, normal lids, irises & conjunctiva ENT: grossly normal hearing, lips & tongue Neck: no LAD, masses or thyromegaly Cardiovascular: RRR, no m/r/g. No LE edema. Pedal pulses present and palpable Respiratory: CTA bilaterally, no w/r/r. Normal respiratory effort. Abdomen: soft, ntnd, positive bowel sounds no guarding Skin: no rash or induration seen on limited exam Musculoskeletal: grossly normal tone BUE/BLE Psychiatric: grossly normal mood and affect, speech fluent and appropriate Neurologic: grossly non-focal. Speech clear facial symmetry           Labs on Admission:  Basic Metabolic Panel:  Recent Labs Lab 03/26/14 1105  NA 141  K 4.8  CL 108  CO2 30  GLUCOSE 149*  BUN 24*  CREATININE 1.71*  CALCIUM 8.9   Liver Function Tests:  Recent Labs Lab 03/26/14 1105  AST 17  ALT 14  ALKPHOS 55  BILITOT 0.4  PROT 6.4  ALBUMIN 3.3*   No results for input(s): LIPASE, AMYLASE in the last 168 hours. No results for input(s): AMMONIA in the last 168 hours. CBC:  Recent Labs Lab 03/26/14 1105  WBC 7.9  NEUTROABS 5.5  HGB 13.6  HCT 43.4  MCV 87.1  PLT 168   Cardiac Enzymes:  Recent Labs Lab 03/26/14 1105  TROPONINI <0.03    BNP (last 3 results) No results for input(s): BNP in the last 8760 hours.  ProBNP (last 3 results) No results for input(s): PROBNP in the last 8760 hours.  CBG: No results for input(s): GLUCAP in the last 168 hours.  Radiological Exams on Admission: Dg Chest 2 View  03/26/2014   CLINICAL DATA:  Chest tightness and pressure.  Chest pain.  EXAM: CHEST  2 VIEW  COMPARISON:  01/07/2013  FINDINGS: Stable 12 mm  right midlung nodular opacity unchanged compared with 01/08/2019 14 May reflect a pulmonary nodule versus prominent vessel. There is no focal parenchymal opacity, pleural effusion, or pneumothorax. The heart and mediastinal contours are unremarkable.  The osseous structures are unremarkable.  IMPRESSION: No active cardiopulmonary disease.  Stable 12 mm right midlung nodular opacity unchanged compared with 01/08/2019 14 May reflect a pulmonary nodule versus prominent vessel.   Electronically Signed   By: Kathreen Devoid   On: 03/26/2014 11:44  EKG: Independently reviewed sinus rhythm borderline right axis deviation no change from 2014 tracing  Assessment/Plan Principal Problem:   Chest pain: Mostly atypical. Pain is worse with movement nonreproducible. He does have some risk factors his weight his cholesterol his diabetes some family medical history. Will admit to telemetry to rule out. We'll cycle cardiac enzymes get serial EKGs. Obtain lipid panel. Continue aspirin start a statin. Will provide supportive therapy in the form of pain medicine and antiemetic as needed. Will request cardiology consult. Will make him nothing by mouth past midnight in case inpatient stress test is scheduled. The time of my exam he was pain-free.  Active Problems:  Diabetes mellitus; on oral agents. Reports recent A1c 10.2. Will recheck. Will continue his oral agents and use sliding scale insulin for optimal control.    Chronic kidney disease: stage II. Difficult to know what his baseline creatinine is. It appears his current level is a little below his baseline. Will monitor his urine output.    Bipolar affective disorder: Appears stable at baseline. Will continue his home medications     BPH (benign prostatic hyperplasia): Stable at baseline. Continue home medications    High cholesterol; obtain lipid panel. Consider a statin.    Obesity: EMI 38.5. Nutritional consult    cardiology  Code Status: full code DVT  Prophylaxis: Family Communication: none present Disposition Plan: home hopefully tomorrow  Time spent: 55 minutes  [facility] Pager [phone]

## 2014-03-26 NOTE — ED Provider Notes (Signed)
TIME SEEN: 11:00 AM  CHIEF COMPLAINT: Chest pain  HPI: Pt is a 64 y.o. male with history of diabetes, hyperlipidemia, chronic kidney disease, family history of CAD who presents to the emergency department with complaints of substernal chest tightness with radiation into his back that started 4 days ago and has been intermittent. Denies any associated shortness of breath, nausea vomiting, diaphoresis or dizziness. Denies that his pain is exertional or pleuritic but is worse with movement. It is not reproducible with palpation of his chest or his back. States today he did have radiation of pain into his jaw. Denies a prior stress test or cardiac catheterization. Did start taking Cialis on Friday into the dose Friday night and Saturday morning but has not had any Cialis past 48 hours. States he called his doctor, Dr. Nadara Mustard, who stated this could be cardiac in nature but also may be a side effect from the Cialis. He recommended coming to the hospital for evaluation.  ROS: See HPI Constitutional: no fever  Eyes: no drainage  ENT: no runny nose   Cardiovascular:   chest pain  Resp: no SOB  GI: no vomiting GU: no dysuria Integumentary: no rash  Allergy: no hives  Musculoskeletal: no leg swelling  Neurological: no slurred speech ROS otherwise negative  PAST MEDICAL HISTORY/PAST SURGICAL HISTORY:  Past Medical History  Diagnosis Date  . Bipolar disorder   . Diabetes mellitus   . S/P colonoscopy Jan 2008    diverticulosis in sigmoid, Dr. Arnoldo Morale  . Adenomatous polyps 2004    per H&P in echart by Dr. Arnoldo Morale  . Rheumatoid arthritis(714.0)   . Genital warts   . Chronic kidney disease   . Diabetes mellitus, type II   . BPH (benign prostatic hyperplasia)   . High cholesterol     MEDICATIONS:  Prior to Admission medications   Medication Sig Start Date End Date Taking? Authorizing Provider  acetaminophen (TYLENOL) 650 MG CR tablet Take 1,300 mg by mouth every 8 (eight) hours as needed for  pain.    Historical Provider, MD  divalproex (DEPAKOTE ER) 500 MG 24 hr tablet Take 3 tablets (1,500 mg total) by mouth daily. 08/22/13   Levonne Spiller, MD  QUEtiapine (SEROQUEL) 100 MG tablet Take one or two at bedtime 08/22/13   Levonne Spiller, MD  sitaGLIPtin (JANUVIA) 100 MG tablet Take 100 mg by mouth daily. IN the AM     Historical Provider, MD  sulfamethoxazole-trimethoprim (BACTRIM DS) 800-160 MG per tablet Take 1 tablet by mouth 2 (two) times daily.    Historical Provider, MD  tamsulosin (FLOMAX) 0.4 MG CAPS capsule Take 0.4 mg by mouth daily.    Historical Provider, MD    ALLERGIES:  Allergies  Allergen Reactions  . Penicillins Shortness Of Breath    Shortness of breath/ tightning in throat  . Lithium     Caused renal failure    SOCIAL HISTORY:  History  Substance Use Topics  . Smoking status: Former Smoker -- 3.00 packs/day for 30 years    Types: Cigarettes    Quit date: 08/09/2005  . Smokeless tobacco: Never Used  . Alcohol Use: No    FAMILY HISTORY: Family History  Problem Relation Age of Onset  . Colon cancer Neg Hx   . ADD / ADHD Neg Hx   . Alcohol abuse Neg Hx   . Drug abuse Neg Hx   . Anxiety disorder Neg Hx   . Bipolar disorder Neg Hx   . Depression Neg Hx   .  OCD Neg Hx   . Paranoid behavior Neg Hx   . Sexual abuse Neg Hx   . Physical abuse Neg Hx   . Dementia Mother   . Schizophrenia Father   . Heart disease Maternal Grandfather   . Heart disease Maternal Grandmother   . Heart disease Paternal Grandfather   . Heart disease Paternal Grandmother   . Seizures Sister     EXAM: BP 144/88 mmHg  Pulse 94  Temp(Src) 98.4 F (36.9 C) (Oral)  Resp 16  Ht 5\' 8"  (1.727 m)  Wt 250 lb (113.399 kg)  BMI 38.02 kg/m2  SpO2 97% CONSTITUTIONAL: Alert and oriented and responds appropriately to questions. Well-appearing; well-nourished HEAD: Normocephalic EYES: Conjunctivae clear, PERRL ENT: normal nose; no rhinorrhea; moist mucous membranes; pharynx without  lesions noted NECK: Supple, no meningismus, no LAD  CARD: RRR; S1 and S2 appreciated; no murmurs, no clicks, no rubs, no gallops RESP: Normal chest excursion without splinting or tachypnea; breath sounds clear and equal bilaterally; no wheezes, no rhonchi, no rales, no hypoxia or respiratory distress CHEST:  Chest wall is nontender to palpation without crepitus or ecchymosis or deformity ABD/GI: Normal bowel sounds; non-distended; soft, non-tender, no rebound, no guarding BACK:  The back appears normal and is non-tender to palpation, there is no CVA tenderness, no midline spinal stenosis or step-off or deformity EXT: Normal ROM in all joints; non-tender to palpation; no edema; normal capillary refill; no cyanosis, no Tenderness or swelling    SKIN: Normal color for age and race; warm NEURO: Moves all extremities equally, normal gait, no facial droop or slurred speech PSYCH: The patient's mood and manner are appropriate. Grooming and personal hygiene are appropriate.  MEDICAL DECISION MAKING: Patient here with chest pain with multiple risk factors for cardiac disease. He was a previous smoker for 30 years, quit in 2007. He reports he did have blood work on Monday and was found to have a hemoglobin A1c of 10 and a very elevated cholesterol level. Denies any history of cardiac provocative testing. EKG shows no ischemic changes at this time. Given his multiple risk factors and concerning story however have recommended admission for ACS rule out overnight. We'll give aspirin and nitroglycerin. Blood pressure normal.  ED PROGRESS: Patient's labs are unremarkable. Negative troponin. Chest x-ray clear. Patient is completely chest pain-free after 1 nitroglycerin. Discussed with patient and wife at length that he does have multiple risk factors for ACS and I'm not convinced that this is musculoskeletal as I'm not being to reproduce his pain with palpation or certain movements. Patient agrees with overnight  admission for observation for ACS rule out. PCP is Dr. Nadara Mustard. We'll discuss with hospitalist.   12:20 PM  D/w Dr. Roderic Palau for admission to observation, telemetry bed to team 2.   EKG Interpretation  Date/Time:  Monday March 26 2014 10:47:29 EST Ventricular Rate:  92 PR Interval:  154 QRS Duration: 87 QT Interval:  334 QTC Calculation: 413 R Axis:   86 Text Interpretation:  Sinus rhythm Borderline right axis deviation No significant change since last tracing 2014 Confirmed by WARD,  DO, KRISTEN [phone]) on 03/26/2014 10:52:46 AM         [place], DO 03/26/14 1221

## 2014-03-26 NOTE — ED Notes (Signed)
Pt denies pain or pressure while laying still, when he gets up moving around is when he feels the pressure.

## 2014-03-26 NOTE — ED Notes (Signed)
Patient c/o chest thightness and pressure that started on Friday. Per patient started new medications on Friday, one being Cialis. Patient states I read that it was a side effect and called Dr Nadara Mustard. Patient reports that he stopped taking Cialis yesterday but still feels the pressure. Denies any shortness of breath. Patient does report a tingling sensation on right side of face.

## 2014-03-27 ENCOUNTER — Observation Stay (HOSPITAL_COMMUNITY): Payer: BLUE CROSS/BLUE SHIELD

## 2014-03-27 ENCOUNTER — Encounter (HOSPITAL_COMMUNITY): Payer: Self-pay

## 2014-03-27 ENCOUNTER — Encounter (HOSPITAL_COMMUNITY): Payer: Self-pay | Admitting: Adult Health

## 2014-03-27 DIAGNOSIS — R072 Precordial pain: Secondary | ICD-10-CM

## 2014-03-27 DIAGNOSIS — F319 Bipolar disorder, unspecified: Secondary | ICD-10-CM

## 2014-03-27 DIAGNOSIS — N400 Enlarged prostate without lower urinary tract symptoms: Secondary | ICD-10-CM

## 2014-03-27 DIAGNOSIS — R079 Chest pain, unspecified: Secondary | ICD-10-CM

## 2014-03-27 LAB — BASIC METABOLIC PANEL
Anion gap: 5 (ref 5–15)
BUN: 25 mg/dL — AB (ref 6–23)
CHLORIDE: 110 mmol/L (ref 96–112)
CO2: 28 mmol/L (ref 19–32)
Calcium: 8.6 mg/dL (ref 8.4–10.5)
Creatinine, Ser: 1.77 mg/dL — ABNORMAL HIGH (ref 0.50–1.35)
GFR calc Af Amer: 45 mL/min — ABNORMAL LOW (ref >90)
GFR, EST NON AFRICAN AMERICAN: 39 mL/min — AB (ref >90)
GLUCOSE: 140 mg/dL — AB (ref 70–99)
Potassium: 4.4 mmol/L (ref 3.5–5.1)
SODIUM: 143 mmol/L (ref 135–145)

## 2014-03-27 LAB — GLUCOSE, CAPILLARY
GLUCOSE-CAPILLARY: 111 mg/dL — AB (ref 70–99)
Glucose-Capillary: 153 mg/dL — ABNORMAL HIGH (ref 70–99)

## 2014-03-27 LAB — TROPONIN I: Troponin I: <0.03 ng/mL (ref <0.031)

## 2014-03-27 LAB — HEMOGLOBIN A1C
Hgb A1c MFr Bld: 10 % — ABNORMAL HIGH (ref 4.8–5.6)
Mean Plasma Glucose: 240 mg/dL

## 2014-03-27 MED ORDER — HYDROCODONE-ACETAMINOPHEN 5-325 MG PO TABS: 1.0000 | ORAL_TABLET | ORAL | Status: DC | PRN

## 2014-03-27 MED ORDER — ATORVASTATIN CALCIUM 10 MG PO TABS: 10.0000 mg | ORAL_TABLET | Freq: Every day | ORAL | Status: AC

## 2014-03-27 MED ORDER — TECHNETIUM TC 99M SESTAMIBI - CARDIOLITE
10.0000 | Freq: Once | INTRAVENOUS | Status: AC | PRN
  Administered 2014-03-27: 08:00:00 10 via INTRAVENOUS

## 2014-03-27 MED ORDER — ASPIRIN EC 81 MG PO TBEC: 81.0000 mg | DELAYED_RELEASE_TABLET | Freq: Every day | ORAL | Status: DC

## 2014-03-27 MED ORDER — REGADENOSON 0.4 MG/5ML IV SOLN
INTRAVENOUS | Status: AC
  Filled 2014-03-27: qty 5

## 2014-03-27 MED ORDER — TECHNETIUM TC 99M SESTAMIBI GENERIC - CARDIOLITE
30.0000 | Freq: Once | INTRAVENOUS | Status: AC | PRN
  Administered 2014-03-27: 30 via INTRAVENOUS

## 2014-03-27 MED ORDER — SODIUM CHLORIDE 0.9 % IJ SOLN
10.0000 mL | INTRAMUSCULAR | Status: DC | PRN
  Administered 2014-03-27: 10 mL via INTRAVENOUS
  Filled 2014-03-27: qty 10

## 2014-03-27 MED ORDER — SODIUM CHLORIDE 0.9 % IJ SOLN
INTRAMUSCULAR | Status: AC
  Administered 2014-03-27: 10 mL via INTRAVENOUS
  Filled 2014-03-27: qty 3

## 2014-03-27 NOTE — Progress Notes (Signed)
Stress Lab Nurses Notes - Andrew Warren  Andrew Warren 03/27/2014 Reason for doing test: Chest Pain Type of test: Stress Cardiolite / Inpatient Rm 312 Nurse performing test: Gerrit Halls, RN Nuclear Medicine Tech: Melburn Hake Echo Tech: Not Applicable MD performing test: P. Johnsie Cancel Family MD: Nadara Mustard Test explained and consent signed: Yes.   IV started: Saline lock flushed, No redness or edema and Saline lock from floor Symptoms: chest pain # 6 Treatment/Intervention: None Reason test stopped: fatigue After recovery IV was: No redness or edema and Saline Lock flushed Patient to return to [name] at :10:25 Patient discharged: Transported back to room 312 via wc Patient's Condition upon discharge was: stable Comments: During test peak BP 124/69 & HR 141.  Recovery BP 112/77 & HR 105.  Chest pain resolved in recovery. Geanie Cooley T

## 2014-03-27 NOTE — Progress Notes (Signed)
Spoke with patient about diabetes and home regimen for diabetes control. Patient reports that he is followed by his PCP for diabetes management and currently he takes Glipizide 10 mg BID and Januvia 100 mg daily as an outpatient for diabetes control. Patient states that he went to his PCP this past Friday and his A1C was 10.1% (up from 6% range) and he was provided with a prescription for Onglyza and his Glipizide was increased from 5mg  BID to 10 mg BID. Patient states that his doctor told him he could finish the Januvia pills he has left then he is to start taking the [place]. Patient reports that the Januvia cost him over $400 out of pocket and he wanted to finish taking the medication before starting the [place].   Discussed A1C results (10.0% on 03/26/14) and explained how the A1C correlates to actual blood glucose values. Patient reports that he checks his glucose a few times per day and his fasting glucose was running between 120-140's mg/dl up until a few weeks ago when it started running 200-300's mg/dl. Discussed importance of checking CBGs and maintaining good CBG control to prevent long-term and short-term complications. Patient reports that he is planning to start the Onglyza once he finishes with the Januvia (has a few more pills left). Patient states that he understands the importance of getting his diabetes controlled and plans to work with his PCP to get his diabetes back under control and to lower his A1C.  Patient verbalized understanding of information discussed and he states that he has no further questions at this time related to diabetes.   Thanks, Barnie Alderman, RN, MSN, CCRN Diabetes Coordinator Inpatient Diabetes Program [phone] (Team Pager) [phone] ([place] office) [phone] [facility] office)

## 2014-03-27 NOTE — Discharge Summary (Signed)
Physician Discharge Summary  Andrew Warren GEX:[id number] DOB: 05/18/1950 DOA: 03/26/2014  PCP: [name], MD  Admit date: 03/26/2014 Discharge date: 03/27/2014  Time spent:  minutes  Recommendations for Outpatient Follow-up:  1. PCP 2 weeks follow up on chest pain and evaluation of DM control. Follow BP  Discharge Diagnoses:  Principal Problem:   Chest pain Active Problems:   Bipolar affective disorder   Diabetes mellitus   Chronic kidney disease   BPH (benign prostatic hyperplasia)   High cholesterol   Obesity   Pain in the chest   Discharge Condition: stable  Diet recommendation: heart healthy carb modifed  Filed Weights   03/26/14 1045 03/26/14 1313 03/27/14 0652  Weight: 113.399 kg (250 lb) 114.76 kg (253 lb) 112.8 kg (248 lb 10.9 oz)    History of present illness:  Andrew Warren is a 64 y.o. male past medical hx DM, CKD, high cholesterol, obesity, bipolar affective disorder presented to ED on 03/26/14 with CC persistent CP. Reports seeing PCP last week and lab work indicated high cholesterol and high A1c. Medications were changed but only detailed med he remembered was being started on Cialis. He had 2 doses and developed substernal chest "tightness". He read the medication insert and noticed chest tightness was a side effect. He stopped taking medication. The pain continued. Pain described as intermittent and only with movement. Denied any diaphoresis, palpitations, sob, headache, syncope or near syncope. Denied coughing, LE edema or orthopnea. Denied recent fever chills sick contact. No dysuria hematuria, diarrhea.   In ED initial troponin negative and EKG without acute changes. He is given NTG with good relief. He was afebrile, hemodynamically stable and not hypoxic.   Hospital Course:  Chest pain: Mostly atypical. Pain worse with movement. cardiac enzymes negative x3. EKG no acute changes. Lipids elevatedl. Evaluated by cards and stress test done  inpatient that was normal. Follow up with PCP for any further chest pain. Continue ACE  Active Problems: Diabetes mellitus; on oral agents. A1c 10.   Chronic kidney disease: stage II. Related to DM and HTN. Stable at baseline. Continue ACEI    Bipolar affective disorder: stable at baseline.    BPH (benign prostatic hyperplasia): Stable at baseline.    High cholesterol; lipitpr   Obesity: EMI 38.5. Nutritional consult    Procedures:  Stress test normal 0/10/27 Systolic function was normal. The estimated ejection fraction was in the range of 55% to 60%. Wall motion was normal; there were no regional wall motion abnormalities. Doppler parameters are consistent with abnormal left ventricular relaxation (grade 1 diastolic dysfunction).  Consultations:  cardiology  Discharge Exam: Filed Vitals:   03/27/14 0652  BP: 128/74  Pulse: 79  Temp: 98.9 F (37.2 C)  Resp: 18    General: obese slightly anxious appear comfortable Cardiovascular: RRR no m/g/r no LE edema Respiratory: normal effort BS clear bilaterally  Discharge Instructions   Discharge Instructions    Diet - low sodium heart healthy    Complete by:  As directed      Increase activity slowly    Complete by:  As directed           Current Discharge Medication List    START taking these medications   Details  aspirin EC 81 MG tablet Take 1 tablet (81 mg total) by mouth daily. Qty: 30 tablet, Refills: 1    atorvastatin (LIPITOR) 10 MG tablet Take 1 tablet (10 mg total) by mouth daily at 6 PM. Qty: 30 tablet,  Refills: 3    HYDROcodone-acetaminophen (NORCO/VICODIN) 5-325 MG per tablet Take 1-2 tablets by mouth every 4 (four) hours as needed for moderate pain. Qty: 30 tablet, Refills: 0      CONTINUE these medications which have NOT CHANGED   Details  acetaminophen (TYLENOL) 650 MG CR tablet Take 1,300 mg by mouth every 8 (eight) hours as needed for pain.    divalproex (DEPAKOTE ER)  500 MG 24 hr tablet Take 3 tablets (1,500 mg total) by mouth daily. Qty: 270 tablet, Refills: 3    glipiZIDE (GLUCOTROL XL) 10 MG 24 hr tablet Take 10 mg by mouth 2 (two) times daily.    lisinopril (PRINIVIL,ZESTRIL) 5 MG tablet Take 5 mg by mouth daily.    QUEtiapine (SEROQUEL) 100 MG tablet Take one or two at bedtime Qty: 180 tablet, Refills: 2    sitaGLIPtin (JANUVIA) 100 MG tablet Take 100 mg by mouth daily. IN the AM     tamsulosin (FLOMAX) 0.4 MG CAPS capsule Take 0.4 mg by mouth daily.       Allergies  Allergen Reactions  . Penicillins Shortness Of Breath    Shortness of breath/ tightning in throat  . Lithium     Caused renal failure   Follow-up Information    Follow up with [name], MD. Schedule an appointment as soon as possible for a visit in 2 weeks.   Specialty:  Family Medicine   Contact information:   [address] [place] [id number] [phone]        The results of significant diagnostics from this hospitalization (including imaging, microbiology, ancillary and laboratory) are listed below for reference.    Significant Diagnostic Studies: Dg Chest 2 View  03/26/2014   CLINICAL DATA:  Chest tightness and pressure.  Chest pain.  EXAM: CHEST  2 VIEW  COMPARISON:  01/07/2013  FINDINGS: Stable 12 mm right midlung nodular opacity unchanged compared with 01/08/2019 14 May reflect a pulmonary nodule versus prominent vessel. There is no focal parenchymal opacity, pleural effusion, or pneumothorax. The heart and mediastinal contours are unremarkable.  The osseous structures are unremarkable.  IMPRESSION: No active cardiopulmonary disease.  Stable 12 mm right midlung nodular opacity unchanged compared with 01/08/2019 14 May reflect a pulmonary nodule versus prominent vessel.   Electronically Signed   By: Kathreen Devoid   On: 03/26/2014 11:44   Nm Myocar Multi W/spect W/wall Motion / Ef  03/27/2014   CLINICAL DATA:  Chest pain  EXAM: Exercise Myovue  TECHNIQUE: Patient  exercised for 4:32 on Bruce protocol. Exercise stopped due to fatigue. Max HR 155 bpm 98% max age predicted. No chest pain Normal hemodynamic response 33.0 mCi of Technetium 20m Sestamibi injected at 30 seconds. Quantitative SPECT images were obtained in the vertical, horizontal and short axis planes after a 45 minute delay. Rest images were obtained with similar planes and delay using 10.2 mCi of Technetium 71m Sestamibi.  FINDINGS: ECG:  NSR no ischemia  Symptoms: None  RAW Data:  Motion and diaphragmatic attenuation  QPS:  SDS 2  Quantitative Gated SPECT EF: 60%  Perfusion Images: Attenuation of inferior wall most likely from diaphragm and motion  IMPRESSION: Low risk myovue Normal ECG response with no chest pain EF normal 60% with no RWMA;s No ischemia. Inferior wall attenuation likely from diaphragmatic attenuation and motion  ?  Halo around heart consider echo to r/o pericardial effusion  Jenkins Rouge   Electronically Signed   By: Eligha Bridegroom.D.  On: 03/27/2014 11:16    Microbiology: No results found for this or any previous visit (from the past 240 hour(s)).   Labs: Basic Metabolic Panel:  Recent Labs Lab 03/26/14 1105 03/27/14 0550  NA 141 143  K 4.8 4.4  CL 108 110  CO2 30 28  GLUCOSE 149* 140*  BUN 24* 25*  CREATININE 1.71* 1.77*  CALCIUM 8.9 8.6   Liver Function Tests:  Recent Labs Lab 03/26/14 1105  AST 17  ALT 14  ALKPHOS 55  BILITOT 0.4  PROT 6.4  ALBUMIN 3.3*   No results for input(s): LIPASE, AMYLASE in the last 168 hours. No results for input(s): AMMONIA in the last 168 hours. CBC:  Recent Labs Lab 03/26/14 1105  WBC 7.9  NEUTROABS 5.5  HGB 13.6  HCT 43.4  MCV 87.1  PLT 168   Cardiac Enzymes:  Recent Labs Lab 03/26/14 1105 03/26/14 1745 03/27/14 2358  TROPONINI <0.03 <0.03 <0.03   BNP: BNP (last 3 results) No results for input(s): BNP in the last 8760 hours.  ProBNP (last 3 results) No results for input(s): PROBNP in the last 8760  hours.  CBG:  Recent Labs Lab 03/26/14 1709 03/26/14 1958 03/27/14 0740 03/27/14 1127  GLUCAP 158* 176* 111* 153*       Signed:  Shannon Kirkendall M  [facility] 03/27/2014, 1:32 PM

## 2014-03-27 NOTE — Progress Notes (Signed)
Reviewed discharge instructions and prescriptions with pt and family.  IV removed, pt tolerated well.  Will continue to monitor until pt leaves the floor.

## 2014-03-27 NOTE — Plan of Care (Signed)
Problem: Food- and Nutrition-Related Knowledge Deficit (NB-1.1) Goal: Nutrition education Formal process to instruct or train a patient/client in a skill or to impart knowledge to help patients/clients voluntarily manage or modify food choices and eating behavior to maintain or improve health. Outcome: Completed/Met Date Met:  03/27/14 RD consulted for diet education regarding weight loss and diabetes.    Dietetic Intern provided patient with "Weight Loss Tips" and "Carbohydrate Counting for People with Type 2 Diabetes" hand outs from the Academy of Nutrition and Dietetics.  Discussed food groups, healthy options for each and their effects on blood glucose control.  Discussed making half his meals non-starchy vegetable and enforced including a protein source in each meal.  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  Discussed healthier menu items in fast food restaurants and healthy meals snacks he can keep with him on the road.  Teach back method used.  Expect good compliance  Pt BMI: 38.5.  Pt meets criteria for Class II obesity based on Body Mass Index (kg/m2)  Diet order currently NPO.  Pt has not consumed a meal since admission.  Labs and chart reviewed.  No further nutrition interventions warranted at this time.  Please re-consult RD if nutrition issues arise.    Elmer Picker MS Dietetic Intern Pager Number [phone]

## 2014-03-27 NOTE — Consult Note (Signed)
CARDIOLOGY CONSULT NOTE   Patient ID: ACEY WOODFIELD MRN: [id number] DOB/AGE: [birth year] 64 y.o.  Admit Date: 03/26/2014 Referring Physician: P[facility] Primary Physician: [name], MD Consulting Cardiologist: Jenkins Rouge Primary Cardiologist New Reason for Consultation: Chest Pain  Clinical Summary Mr. Hulbert is a 64 y.o.male CVRF of hypercholesterolemia, diabetes, with hx of BiPolar disorder, CKD presented to ER with complaints of chest pain.  He is a Administrator, Dance movement psychotherapist for [employer]. He states that a few days ago, he was placed on Cialis by PCP to assist with BPH. After starting this medication he began to have sticking chest pain associated with moving his torso and walking. Did not occur at rest. No associated diaphoresis, dizziness, dyspnea. Called PCP to inform him and was told to stop the Cialis. 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 EKG NSR without evidence of ACS. Glucose 149, Creatinine 1.71. CXR negative for CHF or pneumonia. He was given NTG Subling and ASA in ER and had complete relief of symptoms. This am with movement of upper body he is having some chest discomfort, but not severe. He is NPO for possible stress test. ( Patient would like all testing done as IP).      Allergies  Allergen Reactions  . Penicillins Shortness Of Breath    Shortness of breath/ tightning in throat  . Lithium     Caused renal failure    Medications Scheduled Medications: . aspirin EC  325 mg Oral Daily  . divalproex  1,500 mg Oral QHS  . glipiZIDE  10 mg Oral BID  . heparin  5,000 Units Subcutaneous 3 times per day  . insulin aspart  0-15 Units Subcutaneous TID WC  . lisinopril  5 mg Oral Daily  . QUEtiapine  200 mg Oral QHS  . sodium chloride  3 mL Intravenous Q12H  . tamsulosin  0.4 mg Oral Daily       PRN Medications: acetaminophen **OR** acetaminophen, alum & mag hydroxide-simeth, bisacodyl, HYDROcodone-acetaminophen, morphine injection, nitroGLYCERIN, ondansetron **OR** ondansetron (ZOFRAN) IV, polyethylene glycol   Past Medical History  Diagnosis Date  . Bipolar disorder   . Diabetes mellitus   . S/P colonoscopy Jan 2008    diverticulosis in sigmoid, Dr. Arnoldo Morale  . Adenomatous polyps 2004    per H&P in echart by Dr. Arnoldo Morale  . Rheumatoid arthritis(714.0)   . Genital warts   . Chronic kidney disease   . BPH (benign prostatic hyperplasia)   . High cholesterol     Past Surgical History  Procedure Laterality Date  . Varicocele repair    . Appendectomy      Family History  Problem Relation Age of Onset  . Dementia Mother   . Schizophrenia Father   . Heart disease Maternal Grandfather   . Heart disease Maternal Grandmother   . Heart disease Paternal Grandfather   . Heart disease Paternal Grandmother   . Seizures Sister   . Diabetes Father   . Diabetes Maternal Grandmother   . Diabetes Maternal Grandfather   . Diabetes Paternal Grandmother   . Diabetes Paternal Grandfather   . Hypertension Sister   . Heart attack Brother     Social History Mr. Rotundo reports that he quit smoking about 8 years ago. His smoking use included Cigarettes. He has a  90 pack-year smoking history. He has never used smokeless tobacco. Mr. Boehlke reports that he does not drink alcohol.  Review of Systems Complete review of systems are found to be negative unless outlined in H&P above.  Physical Examination Blood pressure 128/74, pulse 79, temperature 98.9 F (37.2 C), temperature source Oral, resp. rate 18, height 5\' 8"  (1.727 m), weight 248 lb 10.9 oz (112.8 kg), SpO2 100 %.  Intake/Output Summary (Last 24 hours) at 03/27/14 0751 Last data filed at 03/26/14 1754  Gross per 24 hour  Intake      3 ml  Output    700 ml  Net   -697 ml    Telemetry: NSR rates in the 80's to  90's.   GEN: HEENT: Conjunctiva and lids normal, oropharynx clear with moist mucosa. Neck: Supple, no elevated JVP or carotid bruits, no thyromegaly. Lungs: Clear to auscultation, nonlabored breathing at rest. Cardiac: Regular rate and rhythm, no S3 or significant systolic murmur, no pericardial rub. Abdomen: Soft, nontender, no hepatomegaly, bowel sounds present, no guarding or rebound. Extremities: No pitting edema, distal pulses 2+. Skin: Warm and dry. Musculoskeletal: No kyphosis. Neuropsychiatric: Alert and oriented x3, affect grossly appropriate.  Prior Cardiac Testing/Procedures 1. None  Lab Results  Basic Metabolic Panel:  Recent Labs Lab 03/26/14 1105 03/27/14 0550  NA 141 143  K 4.8 4.4  CL 108 110  CO2 30 28  GLUCOSE 149* 140*  BUN 24* 25*  CREATININE 1.71* 1.77*  CALCIUM 8.9 8.6    Liver Function Tests:  Recent Labs Lab 03/26/14 1105  AST 17  ALT 14  ALKPHOS 55  BILITOT 0.4  PROT 6.4  ALBUMIN 3.3*    CBC:  Recent Labs Lab 03/26/14 1105  WBC 7.9  NEUTROABS 5.5  HGB 13.6  HCT 43.4  MCV 87.1  PLT 168    Cardiac Enzymes:  Recent Labs Lab 03/26/14 1105 03/26/14 1745 03/27/14 2358  TROPONINI <0.03 <0.03 <0.03    Radiology: Dg Chest 2 View  03/26/2014   CLINICAL DATA:  Chest tightness and pressure.  Chest pain.  EXAM: CHEST  2 VIEW  COMPARISON:  01/07/2013  FINDINGS: Stable 12 mm right midlung nodular opacity unchanged compared with 01/08/2019 14 May reflect a pulmonary nodule versus prominent vessel. There is no focal parenchymal opacity, pleural effusion, or pneumothorax. The heart and mediastinal contours are unremarkable.  The osseous structures are unremarkable.  IMPRESSION: No active cardiopulmonary disease.  Stable 12 mm right midlung nodular opacity unchanged compared with 01/08/2019 14 May reflect a pulmonary nodule versus prominent vessel.   Electronically Signed   By: Kathreen Devoid   On: 03/26/2014 11:44     ECG: NSR no  evidence of ACS   Impression and Recommendations  1.Atypical chest pain: Associated with movement of torso and some walking, described as a sticking feeling without radiation or associated diaphoresis, dyspnea or weakness. Troponin is negative X3. EKG is normal, arguing against ACS. CVRF of obesity, FH, diabetes, hypercholesterolemia, age, male gender. He would benefit from a NM stress test, and wants to have this as an IP, and not as OP. He is NPO. Will check with NM to see if he can be added on. He wishes to talk directly to cardiologist before agreeing to proceed with stress test.   2. Hypercholesterolemia: Not on statin. No allergies or intolerances seen.  TC 1 89, LDL 100, TG 299. Would start low dose fenofibrate. TG likely elevated due to diabetes. Defer to PCP for management.  3. Diabetes: Per PCP  4. Obesity: Recommend increased activity, lower calorie diet. He is sedentary.    Signed: Phill Myron. Lawrence NP [place]  03/27/2014, 7:51 AM Co-Sign MD  Patient examined chart reviewed.  Obese white male with atypical chest pain.  CRF;s FH, diabetes and elevated lipids Pain free this am  R/o negative troponin and no acute ECG changes.  Given occupation will proceed with exercise nuclear stress testing today.  Can d/c home latter today if study normal.  Appears to have some CRF likely related to HTN and DM.  F/U primary continue glipizide and ACE    Jenkins Rouge

## 2014-03-27 NOTE — Progress Notes (Signed)
*  PRELIMINARY RESULTS* Echocardiogram 2D Echocardiogram has been performed.  Leavy Cella 03/27/2014, 12:08 PM

## 2014-11-10 HISTORY — PX: GALLBLADDER SURGERY: SHX652

## 2015-01-25 ENCOUNTER — Ambulatory Visit (INDEPENDENT_AMBULATORY_CARE_PROVIDER_SITE_OTHER): Payer: Self-pay | Admitting: Psychiatry

## 2015-01-25 ENCOUNTER — Encounter (HOSPITAL_COMMUNITY): Payer: Self-pay | Admitting: Psychiatry

## 2015-01-25 VITALS — BP 118/76 | Ht 68.0 in | Wt 249.0 lb

## 2015-01-25 DIAGNOSIS — F3174 Bipolar disorder, in full remission, most recent episode manic: Secondary | ICD-10-CM

## 2015-01-25 MED ORDER — DIVALPROEX SODIUM ER 500 MG PO TB24: 1500.0000 mg | ORAL_TABLET | Freq: Every day | ORAL | Status: DC

## 2015-01-25 MED ORDER — QUETIAPINE FUMARATE 25 MG PO TABS: ORAL_TABLET | ORAL | Status: DC

## 2015-01-25 NOTE — Progress Notes (Signed)
Patient ID: Andrew Warren, male   DOB: 03/26/1950, 64 y.o.   MRN: [id number] Patient ID: Andrew Warren, male   DOB: [birth year], 64 y.o.   MRN: [id number] Patient ID: Andrew Warren, male   DOB: 01-Jan-1951, 64 y.o.   MRN: [id number] Patient ID: Andrew Warren, male   DOB: 05-Jun-1950, 64 y.o.   MRN: [id number] Patient ID: Andrew Warren, male   DOB: [birth year], 64 y.o.   MRN: [id number] Patient ID: Andrew Warren, male   DOB: Oct 16, 1950, 64 y.o.   MRN: [id number]    Patient Identification:  Andrew Warren Date of Evaluation:  01/25/2015  Chief Complaint: "All I want to do is sleep" Chief Complaint  Patient presents with  . Manic Behavior  . Depression  . Follow-up   History of Chief Complaint:   This patient is a 64 year old married white male who lives with his wife in [place]. He has 4 grown children. 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  The patient states that when he was a young adult he was manic and out of control. He was initially misdiagnosed as being paranoid schizophrenic. He was in and out of hospitals proximally 7 times between the ages of 40 and 60. He was eventually more accurately diagnosed as being bipolar and was started on lithium. He did very well on lithium for a number of years and was monitored by his family doctor, Dr. Nadara Mustard in [place].  Several months ago however he began to be very oversedated. Apparently his renal function was declining and his lithium level had risen. He was seen by nephrologist who recommended he get off lithium immediately. Since then his family Dr. put him on low doses of Seroquel and Depakote. The medication was not enough and he got increasingly manic and hyperactive and during one of his trucking runs he ended up hospitalized in [place]. Since then he's been on his current doses of medication with some modifications. On 1500 mg of Depakote his blood level is 51.  He is on 300 mg of Seroquel with that at bedtime. He is sleeping too soundly to the point of being incontinent at night. He is craving food all the time and has gained about 10 pounds in the last month. He is also type II diabetic and is concerned about how this will affect his blood sugar. Interestingly hot, since getting off lithium his blood sugars have come down. He's no longer manic or hyperactive and his mood is stable but he doesn't like the side effects of incontinence and weight gain.  The patient returns after a long absence. He has not been seen here since July 2015. He states he's been working for the [employer] and is always on the road. His main problem now is that he doesn't like the Seroquel. Since he's been on it all he wants to do is sleep. He has no interest in doing anything else. He feels depressed but he thinks it's from the excessive drowsiness. He even felt so bad the other day that he was thinking about suicide but claims he would never act on it. He states his family doctor has been checking his Depakote level in it's been fine but I need to get these records. He would like to get off Seroquel and I agreed to do this cautiously. He denies any manic symptoms Depression        Associated symptoms  include no decreased concentration and no suicidal ideas.  Review of Systems  Constitutional:       With change from lithium  HENT:       Allergies  Eyes: Negative.   Respiratory: Negative.   Cardiovascular: Negative.   Gastrointestinal: Negative.   Genitourinary: Positive for enuresis.  Musculoskeletal: Positive for back pain and arthralgias.  Neurological: Negative for light-headedness.  Psychiatric/Behavioral: Positive for depression. Negative for suicidal ideas, hallucinations, behavioral problems, confusion, sleep disturbance, self-injury, dysphoric mood and decreased concentration. The patient is not nervous/anxious and is not hyperactive.      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 Patient has one psychiatric hospitalization few months ago when he was traveling [place].  At that time he was not taking lithium and the Seroquel was started 25 mg and Depakote 250 mg.  Patient denies any history of psychosis or paranoia but admitted mania and depression and severe mood swing.  He denies any history of suicidal attempt .    Alcohol and substance use history. 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  Past Medical History:   Past Medical History  Diagnosis Date  . Bipolar disorder ([place])   . Diabetes mellitus   . S/P colonoscopy Jan 2008    diverticulosis in sigmoid, Dr. Arnoldo Morale  . Adenomatous polyps 2004    per H&P in echart by Dr. Arnoldo Morale  . Rheumatoid arthritis(714.0)   . Genital warts   . Chronic kidney disease   . BPH (benign prostatic hyperplasia)   . High cholesterol    History of Loss of Consciousness:  Yes Seizure History:  No Cardiac History:  No Allergies:   Allergies  Allergen Reactions  . Penicillins Shortness Of Breath    Shortness of breath/ tightning in throat  . Lithium     Caused renal failure   Current Medications:  Current Outpatient Prescriptions  Medication Sig Dispense Refill  . acetaminophen (TYLENOL) 650 MG CR tablet Take 1,300 mg by mouth every 8 (eight) hours as needed for pain.    Marland Kitchen aspirin EC 81 MG tablet Take 1 tablet (81 mg total) by mouth daily. 30 tablet 1  . atorvastatin (LIPITOR) 10 MG tablet Take 1 tablet (10 mg total) by mouth daily at 6 PM. 30 tablet 3  . divalproex (DEPAKOTE ER) 500 MG 24 hr tablet Take 3 tablets (1,500 mg total) by mouth at bedtime. 270 tablet 3  . glipiZIDE (GLUCOTROL XL) 10  MG 24 hr tablet Take 10 mg by mouth 2 (two) times daily.    Marland Kitchen HYDROcodone-acetaminophen (NORCO/VICODIN) 5-325 MG per tablet Take 1-2 tablets by mouth every 4 (four) hours as needed for moderate pain. 30 tablet 0  . lisinopril (PRINIVIL,ZESTRIL) 5 MG tablet Take 5 mg by mouth daily.    . QUEtiapine (SEROQUEL) 100 MG tablet Take one or two at bedtime 180 tablet 2  . QUEtiapine (SEROQUEL) 25 MG tablet Take two tablets at bedtime for 3 nights, then one tablet at night for 3 nights, then stop 30 tablet 0  . sitaGLIPtin (JANUVIA) 100 MG tablet Take 100 mg by mouth daily. IN the AM     . tamsulosin (FLOMAX) 0.4 MG CAPS capsule Take 0.4 mg by mouth  daily.     No current facility-administered medications for this visit.    Social History: Current Place of Residence: [place] [id number] Place of Birth: [place], [place] Family Members: Wife Marital Status:  Married Children: 2  Sons: 2  Daughters: 0 Relationships: wife Education:  Dentist Problems/Performance: none Religious Beliefs/Practices: Presb[employer]ian History of Abuse: none Pensions consultant; Farming and trucking, Financial trader History:  None. Legal History: incarcerations for safety of self or others Hobbies/Interests: gardening fishing  Family History:   Family History  Problem Relation Age of Onset  . Dementia Mother   . Schizophrenia Father   . Heart disease Maternal Grandfather   . Heart disease Maternal Grandmother   . Heart disease Paternal Grandfather   . Heart disease Paternal Grandmother   . Seizures Sister   . Diabetes Father   . Diabetes Maternal Grandmother   . Diabetes Maternal Grandfather   . Diabetes Paternal Grandmother   . Diabetes Paternal Grandfather   . Hypertension Sister   . Heart attack Brother     Mental status examination. Patient is obese male who is casually dressed and fairly groomed.  His speech is clear and understandable  He described his  mood is a bit low and he has low motivation and his affect is  slightly irritable  He denies any active or passive suicidal thoughts or homicidal thoughts.  He denies any auditory or visual hallucination.  He has a resting tremor in both hands.  There were no paranoia or delusion obsession present at this time.  His psychomotor activity is within normal limits.  No  flight of idea [place] or any loose association.  He's alert and oriented x3.  His insight judgment and impulse control is okay.  Assessment:   Axis I bipolar disorder1 Axis II deferred Axis III diabetes mellitus, obesity, recent renal failure secondary to lithium Axis IV mild to moderate Axis V 75  Plan The patient will continue Depakote ER 1500 mg at bedtime. He will decrease Seroquel  gradually over the next week and then discontinue it He'll return in 3 weeks but call if manic or depressive symptoms worsen .spent 25 minutes.  More than 50% of the time spent in psychoeducation, counseling and coordination of care.  Medical Decision Making Problem Points:  Established problem, stable/improving (1), Review of last therapy session (1) and Review of psycho-social stressors (1) Data Points:  Review or order clinical lab tests (1) Review of medication regiment & side effects (2) Review of new medications or change in dosage (2)  I certify that outpatient services furnished can reasonably be expected to improve the patient's condition.   Levonne Spiller, MD,

## 2015-02-13 ENCOUNTER — Encounter (HOSPITAL_COMMUNITY): Payer: Self-pay | Admitting: Psychiatry

## 2015-02-13 ENCOUNTER — Ambulatory Visit (INDEPENDENT_AMBULATORY_CARE_PROVIDER_SITE_OTHER): Payer: BLUE CROSS/BLUE SHIELD | Admitting: Psychiatry

## 2015-02-13 VITALS — BP 107/64 | HR 90 | Ht 68.0 in | Wt 244.4 lb

## 2015-02-13 DIAGNOSIS — F3174 Bipolar disorder, in full remission, most recent episode manic: Secondary | ICD-10-CM

## 2015-02-13 MED ORDER — QUETIAPINE FUMARATE 25 MG PO TABS: 25.0000 mg | ORAL_TABLET | Freq: Every day | ORAL | Status: DC

## 2015-02-13 NOTE — Progress Notes (Signed)
Patient ID: Andrew Warren, male   DOB: [birth year], 65 y.o.   MRN: [id number] Patient ID: Andrew Warren, male   DOB: 1950-09-09, 65 y.o.   MRN: [id number] Patient ID: Andrew Warren, male   DOB: 11/14/1950, 65 y.o.   MRN: [id number] Patient ID: Andrew Warren, male   DOB: Sep 29, 1950, 65 y.o.   MRN: [id number] Patient ID: Andrew Warren, male   DOB: 04/16/1950, 65 y.o.   MRN: [id number] Patient ID: Andrew Warren, male   DOB: Feb 22, 1950, 65 y.o.   MRN: [id number] Patient ID: Andrew Warren, male   DOB: 05-18-1950, 65 y.o.   MRN: [id number]    Patient Identification:  Andrew Warren Date of Evaluation:  02/13/2015  Chief Complaint: "All I want to do is sleep" Chief Complaint  Patient presents with  . Depression  . Manic Behavior  . Follow-up   History of Chief Complaint:   This patient is a 65 year old married white male who lives with his wife in [place]. He has 4 grown children. 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  The patient states that when he was a young adult he was manic and out of control. He was initially misdiagnosed as being paranoid schizophrenic. He was in and out of hospitals proximally 7 times between the ages of 65 and 47. He was eventually more accurately diagnosed as being bipolar and was started on lithium. He did very well on lithium for a number of years and was monitored by his family doctor, Dr. Nadara Mustard in [place].  Several months ago however he began to be very oversedated. Apparently his renal function was declining and his lithium level had risen. He was seen by nephrologist who recommended he get off lithium immediately. Since then his family Dr. put him on low doses of Seroquel and Depakote. The medication was not enough and he got increasingly manic and hyperactive and during one of his trucking runs he ended up hospitalized in [place]. Since then he's been on his current doses of  medication with some modifications. On 1500 mg of Depakote his blood level is 51. He is on 300 mg of Seroquel with that at bedtime. He is sleeping too soundly to the point of being incontinent at night. He is craving food all the time and has gained about 10 pounds in the last month. He is also type II diabetic and is concerned about how this will affect his blood sugar. Interestingly hot, since getting off lithium his blood sugars have come down. He's no longer manic or hyperactive and his mood is stable but he doesn't like the side effects of incontinence and weight gain.  The patient returns after 4 weeks. He is here with his wife. He is now down the Seroquel 25 mg. He feels better more alert and is only sleeping 8 hours a night instead of 12-15. He denies any manic or depressive symptoms right now. He recently had gallbladder surgery and is recovering nicely. He continues on the Depakote and he and his wife plan to go back out in their truck in the next 2-3 weeks Depression        Associated symptoms include no decreased concentration and no suicidal ideas.  Review of Systems  Constitutional:       With change from lithium  HENT:       Allergies  Eyes: Negative.   Respiratory: Negative.   Cardiovascular: Negative.  Gastrointestinal: Negative.   Genitourinary: Positive for enuresis.  Musculoskeletal: Positive for back pain and arthralgias.  Neurological: Negative for light-headedness.  Psychiatric/Behavioral: Positive for depression. Negative for suicidal ideas, hallucinations, behavioral problems, confusion, sleep disturbance, self-injury, dysphoric mood and decreased concentration. The patient is not nervous/anxious and is not hyperactive.     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 Patient has one psychiatric  hospitalization few months ago when he was traveling [place].  At that time he was not taking lithium and the Seroquel was started 25 mg and Depakote 250 mg.  Patient denies any history of psychosis or paranoia but admitted mania and depression and severe mood swing.  He denies any history of suicidal attempt .    Alcohol and substance use history. 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  Past Medical History:   Past Medical History  Diagnosis Date  . Bipolar disorder ([place])   . Diabetes mellitus   . S/P colonoscopy Jan 2008    diverticulosis in sigmoid, Dr. Arnoldo Morale  . Adenomatous polyps 2004    per H&P in echart by Dr. Arnoldo Morale  . Rheumatoid arthritis(714.0)   . Genital warts   . Chronic kidney disease   . BPH (benign prostatic hyperplasia)   . High cholesterol    History of Loss of Consciousness:  Yes Seizure History:  No Cardiac History:  No Allergies:   Allergies  Allergen Reactions  . Penicillins Shortness Of Breath    Shortness of breath/ tightning in throat  . Lithium     Caused renal failure   Current Medications:  Current Outpatient Prescriptions  Medication Sig Dispense Refill  . acetaminophen (TYLENOL) 650 MG CR tablet Take 1,300 mg by mouth every 8 (eight) hours as needed for pain.    Marland Kitchen aspirin EC 81 MG tablet Take 1 tablet (81 mg total) by mouth daily. 30 tablet 1  . atorvastatin (LIPITOR) 10 MG tablet Take 1 tablet (10 mg total) by mouth daily at 6 PM. 30 tablet 3  . canagliflozin (INVOKANA) 100 MG TABS tablet Take 100 mg by mouth daily before breakfast.    . divalproex (DEPAKOTE ER) 500 MG 24 hr tablet Take 3 tablets (1,500 mg total) by mouth at bedtime. 270 tablet 3  . glipiZIDE (GLUCOTROL XL) 10 MG 24 hr tablet Take 10 mg by mouth 2 (two) times daily.    Marland Kitchen HYDROcodone-acetaminophen (NORCO/VICODIN) 5-325 MG per tablet Take 1-2 tablets by  mouth every 4 (four) hours as needed for moderate pain. 30 tablet 0  . lisinopril (PRINIVIL,ZESTRIL) 5 MG tablet Take 5 mg by mouth daily.    . QUEtiapine (SEROQUEL) 25 MG tablet Take 1 tablet (25 mg total) by mouth at bedtime. 90 tablet 2  . tamsulosin (FLOMAX) 0.4 MG CAPS capsule Take 0.4 mg by mouth daily.     No current facility-administered medications for this visit.    Social History: Current Place of Residence: [place] [address] Place of Birth: [place], [place] Family Members: Wife Marital Status:  Married Children: 2  Sons: 2  Daughters: 0 Relationships: wife Education:  Dentist Problems/Performance: none Religious Beliefs/Practices: Presb[employer]ian History of Abuse: none Pensions consultant; Farming and trucking, Financial trader  History:  None. Legal History: incarcerations for safety of self or others Hobbies/Interests: gardening fishing  Family History:   Family History  Problem Relation Age of Onset  . Dementia Mother   . Schizophrenia Father   . Heart disease Maternal Grandfather   . Heart disease Maternal Grandmother   . Heart disease Paternal Grandfather   . Heart disease Paternal Grandmother   . Seizures Sister   . Diabetes Father   . Diabetes Maternal Grandmother   . Diabetes Maternal Grandfather   . Diabetes Paternal Grandmother   . Diabetes Paternal Grandfather   . Hypertension Sister   . Heart attack Brother     Mental status examination. Patient is obese male who is casually dressed and fairly groomed.  His speech is clear and understandable  He described his mood as good as usual he is somewhat irritable He denies any active or passive suicidal thoughts or homicidal thoughts.  He denies any auditory or visual hallucination.  There were no paranoia or delusion obsession present at this time.  His psychomotor activity is within normal limits.  No  flight of idea [place] or any loose association.  He's  alert and oriented x3.  His insight judgment and impulse control is okay.  Assessment:   Axis I bipolar disorder1 Axis II deferred Axis III diabetes mellitus, obesity, recent renal failure secondary to lithium Axis IV mild to moderate Axis V 75  Plan The patient will continue Depakote ER 1500 mg at bedtime. He will continue Seroquel 25 mg at bedtime. He'll return in 3 months time spent  15 minutes.  More than 50% of the time spent in psychoeducation, counseling and coordination of care.  Medical Decision Making Problem Points:  Established problem, stable/improving (1), Review of last therapy session (1) and Review of psycho-social stressors (1) Data Points:  Review or order clinical lab tests (1) Review of medication regiment & side effects (2) Review of new medications or change in dosage (2)  I certify that outpatient services furnished can reasonably be expected to improve the patient's condition.   Levonne Spiller, MD,

## 2015-05-14 ENCOUNTER — Ambulatory Visit (HOSPITAL_COMMUNITY): Payer: Self-pay | Admitting: Psychiatry

## 2015-05-15 ENCOUNTER — Encounter (HOSPITAL_COMMUNITY): Payer: Self-pay | Admitting: Psychiatry

## 2016-01-16 ENCOUNTER — Telehealth (HOSPITAL_COMMUNITY): Payer: Self-pay | Admitting: *Deleted

## 2016-01-16 NOTE — Telephone Encounter (Signed)
That should be ok but I still need to see him in the next 60 days

## 2016-01-16 NOTE — Telephone Encounter (Signed)
Called pt back per previous phone call. Per pt, he's doing fine but his Depakote price will be increasing to $108. Per pt, he spoke with his pharmacist and was informed that if provider puts him on Depakote DR instead of ER, his cost would be cheaper. Per pt, his PCP is willing to fill the medication as long as Dr. Harrington Challenger approves the change. Informed pt he needed to make f/u appt with provider due to last appt being 02-13-2015. Per pt, due to the type of job he does, he is currently only able to come either next week Wednesday afternoon or Thursday afternoon. But after then he is not off and will be out of town. Per pt he is doing well and PCP is willing to fill medication and all he need is an approval from Dr. Harrington Challenger to change from ER to DR Depakote. Pt number is [phone].

## 2016-01-16 NOTE — Telephone Encounter (Signed)
patient would not schedule appointment. He will see if his PC will write script.  He wants to know if he can have the Depakote 500 mg. 3 ER.   Last seen 02/13/15, no show April.

## 2016-01-16 NOTE — Telephone Encounter (Signed)
Only if he schedules an appt in the next 60 days

## 2016-01-17 NOTE — Telephone Encounter (Signed)
Pt called office back. Informed pt with what provider stated and he verbalized understanding. Scheduled pt for Jan 12th and per pt due to him working for the [employer] system, if he has to change his appt he will just call back but Mondays and Fridays are the best days for him.

## 2016-01-17 NOTE — Telephone Encounter (Signed)
Called pt to informed him with what provider stated but was unable to reach him via mobile.lmtcb will call pt home number.

## 2016-01-17 NOTE — Telephone Encounter (Signed)
Called pt home number and lmtcb  

## 2016-01-17 NOTE — Telephone Encounter (Signed)
Pt made appt for 02-21-2016

## 2016-01-20 ENCOUNTER — Ambulatory Visit (INDEPENDENT_AMBULATORY_CARE_PROVIDER_SITE_OTHER): Payer: Medicare Other | Admitting: Gastroenterology

## 2016-01-20 ENCOUNTER — Encounter: Payer: Self-pay | Admitting: Gastroenterology

## 2016-01-20 ENCOUNTER — Other Ambulatory Visit: Payer: Self-pay

## 2016-01-20 VITALS — BP 134/85 | HR 95 | Temp 98.0°F | Ht 70.0 in | Wt 251.0 lb

## 2016-01-20 DIAGNOSIS — K529 Noninfective gastroenteritis and colitis, unspecified: Secondary | ICD-10-CM

## 2016-01-20 DIAGNOSIS — J209 Acute bronchitis, unspecified: Secondary | ICD-10-CM | POA: Diagnosis not present

## 2016-01-20 DIAGNOSIS — D369 Benign neoplasm, unspecified site: Secondary | ICD-10-CM | POA: Diagnosis not present

## 2016-01-20 DIAGNOSIS — E1165 Type 2 diabetes mellitus with hyperglycemia: Secondary | ICD-10-CM | POA: Diagnosis not present

## 2016-01-20 DIAGNOSIS — Z86010 Personal history of colonic polyps: Principal | ICD-10-CM

## 2016-01-20 DIAGNOSIS — Z1211 Encounter for screening for malignant neoplasm of colon: Secondary | ICD-10-CM

## 2016-01-20 DIAGNOSIS — Z6838 Body mass index (BMI) 38.0-38.9, adult: Secondary | ICD-10-CM | POA: Diagnosis not present

## 2016-01-20 LAB — CBC WITH DIFFERENTIAL/PLATELET
BASOS PCT: 0 %
Basophils Absolute: 0 cells/uL (ref 0–200)
EOS ABS: 214 {cells}/uL (ref 15–500)
EOS PCT: 2 %
HCT: 44.7 % (ref 38.5–50.0)
Hemoglobin: 14 g/dL (ref 13.2–17.1)
LYMPHS PCT: 19 %
Lymphs Abs: 2033 cells/uL (ref 850–3900)
MCH: 26.5 pg — ABNORMAL LOW (ref 27.0–33.0)
MCHC: 31.3 g/dL — ABNORMAL LOW (ref 32.0–36.0)
MCV: 84.7 fL (ref 80.0–100.0)
MONOS PCT: 6 %
MPV: 9.6 fL (ref 7.5–12.5)
Monocytes Absolute: 642 cells/uL (ref 200–950)
NEUTROS ABS: 7811 {cells}/uL — AB (ref 1500–7800)
Neutrophils Relative %: 73 %
PLATELETS: 253 10*3/uL (ref 140–400)
RBC: 5.28 MIL/uL (ref 4.20–5.80)
RDW: 15.7 % — AB (ref 11.0–15.0)
WBC: 10.7 10*3/uL (ref 3.8–10.8)

## 2016-01-20 MED ORDER — METRONIDAZOLE 500 MG PO TABS: 500.0000 mg | ORAL_TABLET | Freq: Three times a day (TID) | ORAL | 0 refills | Status: DC

## 2016-01-20 MED ORDER — PEG 3350-KCL-NA BICARB-NACL 420 G PO SOLR: 4000.0000 mL | ORAL | 0 refills | Status: DC

## 2016-01-20 NOTE — Patient Instructions (Addendum)
1. Please take your stool specimen to the lab today. I also want to check blood work to further evaluate your diarrhea. Plan to rule out celiac disease. 2. Colonoscopy as scheduled. Please see separate instructions. 3. Flagyl sent to your pharmacy for 10 day course. I am not giving the second antibiotic we discussed at this time due to potential interference with one of your medications. If your stool test comes back positive we will discuss at that time.

## 2016-01-20 NOTE — Assessment & Plan Note (Addendum)
66 year old gentleman with history of chronic diarrhea dating back to 2011, history of adenomatous colon polyps overdue for surveillance colonoscopy presents for follow-up. Workup for diarrhea has included negative stool studies back in 2012. Recently treated empirically with Flagyl by his urologist with some improvement in his symptoms. Infectious etiology less of a likelihood given chronicity of his symptoms although cannot exclude acute infection in the setting of chronic diarrhea. He has collected a stool specimen which he will turn in today. We'll go ahead and start him on Flagyl for 10 days since he seemed to improve on this before. We'll also plan on checking for celiac disease.   Colonoscopy planned for January with deep sedation in the OR due to inadequate conscious sedation previously.  I have discussed the risks, alternatives, benefits with regards to but not limited to the risk of reaction to medication, bleeding, infection, perforation and the patient is agreeable to proceed. Written consent to be obtained.

## 2016-01-20 NOTE — Progress Notes (Signed)
cc'ed to pcp °

## 2016-01-20 NOTE — Progress Notes (Signed)
Primary Care Physician:  [name], MD  Primary Gastroenterologist:  Garfield Cornea, MD   Chief Complaint  Patient presents with  . Diarrhea    HPI:  Andrew Warren is a 65 y.o. male here for follow up of diarrhea. Last seen in 11/2010 for one year y/o diarrhea with incontinence. Had stopped Zocor one week before and noted improvement of symptoms. Was due for TCS in 2013 for h/o Adenomatous polyps but has not completed this shift. His last 2 colonoscopies were by Dr. Arnoldo Morale in 2004 and 2008. Patient states he was not completely sedated at the time.  Over the past several years he has had ongoing diarrhea. Patient states he was unable to follow-up because of terrible insurance, Obamacare. Now that he has Medicare he is trying to catch up. He has plans for urological procedure later this week because of incontinence. States that his urologist was concerned about his chronic diarrhea, gave him 10 day course of metronidazole which seemed to help his diarrhea until he ran out. No stool studies and recently. They were all negative in 2012. In the interim patient also had his gallbladder removed about a year ago. He has noted no change in his bowel habits since then. Typically has a semi-formed stool every morning. Subsequently followed by at least 2 episodes of large loose stools which most of time associated with fecal incontinence. 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 When he is at home he has less issues with this. He denies melena rectal bleeding. No abdominal pain. No upper GI symptoms. No unintentional weight loss. He is considering gastric sleeve surgery.He has chronic back pain. No recent medication changes. No recent antibiotics other than the Flagyl.  Current Outpatient Prescriptions  Medication Sig Dispense Refill  . acetaminophen (TYLENOL) 650 MG CR tablet Take 1,300 mg by mouth every 8 (eight) hours as needed for pain.    Marland Kitchen aspirin EC 81 MG tablet Take  1 tablet (81 mg total) by mouth daily. 30 tablet 1  . atorvastatin (LIPITOR) 10 MG tablet Take 1 tablet (10 mg total) by mouth daily at 6 PM. 30 tablet 3  . canagliflozin (INVOKANA) 100 MG TABS tablet Take 100 mg by mouth daily before breakfast.    . divalproex (DEPAKOTE ER) 500 MG 24 hr tablet Take 3 tablets (1,500 mg total) by mouth at bedtime. 270 tablet 3  . glipiZIDE (GLUCOTROL XL) 10 MG 24 hr tablet Take 10 mg by mouth 2 (two) times daily.    Marland Kitchen lisinopril (PRINIVIL,ZESTRIL) 5 MG tablet Take 5 mg by mouth daily.    . QUEtiapine (SEROQUEL) 25 MG tablet Take 1 tablet (25 mg total) by mouth at bedtime. 90 tablet 2  . tamsulosin (FLOMAX) 0.4 MG CAPS capsule Take 0.4 mg by mouth daily.    . traMADol (ULTRAM) 50 MG tablet Take 50 mg by mouth 2 (two) times daily.     No current facility-administered medications for this visit.     Allergies as of 01/20/2016 - Review Complete 01/20/2016  Allergen Reaction Noted  . Penicillins Shortness Of Breath 11/27/2010  . Lithium  10/13/2012    Past Medical History:  Diagnosis Date  . Adenomatous polyps 2004   per H&P in echart by Dr. Arnoldo Morale  . Bipolar disorder ([place])   . BPH (benign prostatic hyperplasia)   . Chronic kidney disease   . Diabetes mellitus   . Genital warts   . High cholesterol   .  Rheumatoid arthritis(714.0)   . S/P colonoscopy Jan 2008   diverticulosis in sigmoid, Dr. Arnoldo Morale    Past Surgical History:  Procedure Laterality Date  . APPENDECTOMY    . GALLBLADDER SURGERY  11/2014  . varicocele repair      Family History  Problem Relation Age of Onset  . Dementia Mother   . Schizophrenia Father   . Diabetes Father   . Heart disease Maternal Grandfather   . Diabetes Maternal Grandfather   . Heart disease Maternal Grandmother   . Diabetes Maternal Grandmother   . Heart disease Paternal Grandfather   . Diabetes Paternal Grandfather   . Heart disease Paternal Grandmother   . Diabetes Paternal Grandmother   . Seizures  Sister   . Hypertension Sister   . Heart attack Brother   . Colon cancer Neg Hx     Social History   Social History  . Marital status: Married    Spouse name: N/A  . Number of children: N/A  . Years of education: N/A   Occupational History  . Not on file.   Social History Main Topics  . Smoking status: Former Smoker    Packs/day: 3.00    Years: 30.00    Types: Cigarettes    Quit date: 08/09/2005  . Smokeless tobacco: Never Used  . Alcohol use No  . Drug use: No  . Sexual activity: Yes   Other Topics Concern  . Not on file   Social History Narrative  . No narrative on file      ROS:  General: Negative for anorexia, weight loss, fever, chills, fatigue, weakness. Eyes: Negative for vision changes.  ENT: Negative for hoarseness, difficulty swallowing , nasal congestion. CV: Negative for chest pain, angina, palpitations, dyspnea on exertion, peripheral edema.  Respiratory: Negative for dyspnea at rest, dyspnea on exertion, cough, sputum, wheezing.  GI: See history of present illness. GU:  Negative for dysuria, hematuria, urinary incontinence, urinary frequency, nocturnal urination.  MS: Negative for joint pain. Chronic low back pain.  Derm: Negative for rash or itching.  Neuro: Negative for weakness, abnormal sensation, seizure, frequent headaches, memory loss, confusion.  Psych: Positive depression. Negative suicidal ideation, hallucinations.  Endo: Negative for unusual weight change.  Heme: Negative for bruising or bleeding. Allergy: Negative for rash or hives.    Physical Examination:  BP 134/85   Pulse 95   Temp 98 F (36.7 C) (Oral)   Ht 5\' 10"  (1.778 m)   Wt 251 lb (113.9 kg)   BMI 36.01 kg/m    General: Well-nourished, well-developed obese, in no acute distress.  Head: Normocephalic, atraumatic.   Eyes: Conjunctiva pink, no icterus. Mouth: Oropharyngeal mucosa moist and pink , no lesions erythema or exudate. Neck: Supple without thyromegaly,  masses, or lymphadenopathy.  Lungs: Clear to auscultation bilaterally.  Heart: Regular rate and rhythm, no murmurs rubs or gallops.  Abdomen: Bowel sounds are normal, nontender, nondistended, no hepatosplenomegaly or masses, no abdominal bruits, no rebound or guarding.  Rectus diastases, questionable hernia above the umbilicus Rectal: Not performed Extremities: No lower extremity edema. No clubbing or deformities.  Neuro: Alert and oriented x 4 , grossly normal neurologically.  Skin: Warm and dry, no rash or jaundice.   Psych: Alert and cooperative, normal mood and affect.  Labs: Lab Results  Component Value Date   CREATININE 1.77 (H) 03/27/2014   BUN 25 (H) 03/27/2014   NA 143 03/27/2014   K 4.4 03/27/2014   CL 110 03/27/2014   CO2  28 03/27/2014   Lab Results  Component Value Date   ALT 14 03/26/2014   AST 17 03/26/2014   ALKPHOS 55 03/26/2014   BILITOT 0.4 03/26/2014   Lab Results  Component Value Date   WBC 7.9 03/26/2014   HGB 13.6 03/26/2014   HCT 43.4 03/26/2014   MCV 87.1 03/26/2014   PLT 168 03/26/2014     Imaging Studies: No results found.

## 2016-01-21 LAB — GASTROINTESTINAL PATHOGEN PANEL PCR
C. difficile Tox A/B, PCR: NOT DETECTED
CAMPYLOBACTER, PCR: NOT DETECTED
Cryptosporidium, PCR: NOT DETECTED
E coli (ETEC) LT/ST PCR: NOT DETECTED
E coli (STEC) stx1/stx2, PCR: NOT DETECTED
E coli 0157, PCR: NOT DETECTED
GIARDIA LAMBLIA, PCR: NOT DETECTED
NOROVIRUS, PCR: NOT DETECTED
ROTAVIRUS, PCR: NOT DETECTED
SALMONELLA, PCR: NOT DETECTED
SHIGELLA, PCR: NOT DETECTED

## 2016-01-21 LAB — TISSUE TRANSGLUTAMINASE, IGA: TISSUE TRANSGLUTAMINASE AB, IGA: 1 U/mL (ref <4)

## 2016-01-22 ENCOUNTER — Encounter: Payer: Self-pay | Admitting: Psychiatry

## 2016-01-22 ENCOUNTER — Ambulatory Visit (INDEPENDENT_AMBULATORY_CARE_PROVIDER_SITE_OTHER): Payer: Medicare Other | Admitting: Psychiatry

## 2016-01-22 ENCOUNTER — Encounter: Payer: Self-pay | Admitting: Obstetrics and Gynecology

## 2016-01-22 ENCOUNTER — Other Ambulatory Visit: Payer: Self-pay | Admitting: Gastroenterology

## 2016-01-22 ENCOUNTER — Encounter (HOSPITAL_COMMUNITY): Payer: Self-pay | Admitting: Psychiatry

## 2016-01-22 ENCOUNTER — Telehealth: Payer: Self-pay

## 2016-01-22 VITALS — BP 126/71 | HR 84 | Ht 70.0 in | Wt 249.2 lb

## 2016-01-22 DIAGNOSIS — Z880 Allergy status to penicillin: Secondary | ICD-10-CM

## 2016-01-22 DIAGNOSIS — F3174 Bipolar disorder, in full remission, most recent episode manic: Secondary | ICD-10-CM | POA: Diagnosis not present

## 2016-01-22 DIAGNOSIS — E782 Mixed hyperlipidemia: Secondary | ICD-10-CM | POA: Diagnosis not present

## 2016-01-22 DIAGNOSIS — Z833 Family history of diabetes mellitus: Secondary | ICD-10-CM

## 2016-01-22 DIAGNOSIS — Z888 Allergy status to other drugs, medicaments and biological substances status: Secondary | ICD-10-CM | POA: Diagnosis not present

## 2016-01-22 DIAGNOSIS — Z800 Family history of malignant neoplasm of digestive organs: Secondary | ICD-10-CM

## 2016-01-22 DIAGNOSIS — Z818 Family history of other mental and behavioral disorders: Secondary | ICD-10-CM

## 2016-01-22 DIAGNOSIS — N183 Chronic kidney disease, stage 3 (moderate): Secondary | ICD-10-CM | POA: Diagnosis not present

## 2016-01-22 DIAGNOSIS — Z8249 Family history of ischemic heart disease and other diseases of the circulatory system: Secondary | ICD-10-CM

## 2016-01-22 DIAGNOSIS — F319 Bipolar disorder, unspecified: Secondary | ICD-10-CM | POA: Diagnosis not present

## 2016-01-22 DIAGNOSIS — E1165 Type 2 diabetes mellitus with hyperglycemia: Secondary | ICD-10-CM | POA: Diagnosis not present

## 2016-01-22 DIAGNOSIS — Z79899 Other long term (current) drug therapy: Secondary | ICD-10-CM

## 2016-01-22 DIAGNOSIS — Z7982 Long term (current) use of aspirin: Secondary | ICD-10-CM

## 2016-01-22 LAB — IGA: IGA: 191 mg/dL (ref 81–463)

## 2016-01-22 MED ORDER — DIVALPROEX SODIUM 500 MG PO DR TAB: DELAYED_RELEASE_TABLET | ORAL | 2 refills | Status: DC

## 2016-01-22 MED ORDER — QUETIAPINE FUMARATE 25 MG PO TABS: 25.0000 mg | ORAL_TABLET | Freq: Every day | ORAL | 2 refills | Status: DC

## 2016-01-22 NOTE — Telephone Encounter (Signed)
Please see result

## 2016-01-22 NOTE — Telephone Encounter (Signed)
Pt is called to his stool sample. His is having very bad diarrhea this morning. Please advise

## 2016-01-22 NOTE — Progress Notes (Signed)
Please let patient know that his stool test was negative. No evidence of C. difficile, Escherichia coli, Giardia as well as numerous other pathogens as outlined. Celiac screen negative as well.  Colonoscopy is scheduled. I do not feel strongly that patient needs to complete Flagyl (metronidazole).  For diarrhea, please asked patient to take Imodium 2 mg every morning, may take an additional 1-2 doses later in the day if needed for diarrhea. Hold for constipation. If he has artery tried Imodium please let me know.

## 2016-01-22 NOTE — Progress Notes (Signed)
Patient ID: EZEKIAH FRYDENLUND, male   DOB: 03-24-1950, 65 y.o.   MRN: [id number] Patient ID: ARY PERE, male   DOB: [birth year], 65 y.o.   MRN: [id number] Patient ID: TACUMA LAUERMAN, male   DOB: 1950-09-03, 65 y.o.   MRN: [id number] Patient ID: KANAV PALERMO, male   DOB: 10/11/1950, 65 y.o.   MRN: [id number] Patient ID: CARMIE RISENHOOVER, male   DOB: 07-Jun-1950, 65 y.o.   MRN: [id number] Patient ID: KAZUMA AYALA, male   DOB: [birth year], 65 y.o.   MRN: [id number] Patient ID: MAKADE RONDON, male   DOB: May 30, 1950, 65 y.o.   MRN: [id number]    Patient Identification:  VIRGIL BRANDENBURG Date of Evaluation:  01/22/2016  Chief Complaint:" I'm doing pretty well"  Chief Complaint  Patient presents with  . Follow-up    pt would like a 3 month supply of meds today due to price  . Manic Behavior  . Depression   History of Chief Complaint:   This patient is a 65 year old married white male who lives with his wife in [place]. He has 4 grown children. 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  The patient states that when he was a young adult he was manic and out of control. He was initially misdiagnosed as being paranoid schizophrenic. He was in and out of hospitals proximally 7 times between the ages of 57 and 42. He was eventually more accurately diagnosed as being bipolar and was started on lithium. He did very well on lithium for a number of years and was monitored by his family doctor, Dr. Nadara Mustard in [place].  Several months ago however he began to be very oversedated. Apparently his renal function was declining and his lithium level had risen. He was seen by nephrologist who recommended he get off lithium immediately. Since then his family Dr. put him on low doses of Seroquel and Depakote. The medication was not enough and he got increasingly manic and hyperactive and during one of his trucking runs he ended up hospitalized in [place]. Since then he's been on his current doses of medication with some modifications. On 1500 mg of Depakote his blood level is 51. He is on 300 mg of Seroquel with that at bedtime. He is sleeping too soundly to the point of being incontinent at night. He is craving food all the time and has gained about 10 pounds in the last month. He is also type II diabetic and is concerned about how this will affect his blood sugar. Interestingly hot, since getting off lithium his blood sugars have come down. He's no longer manic or hyperactive and his mood is stable but he doesn't like the side effects of incontinence and weight gain.  The patient returns after a long absence. He was last seen here about 11 months ago. He's been working over [employer] and for the most part he is doing well. He denies any further excessive sleepiness. He states that his mood is been stable although he was depressed a few weeks ago when his bowel and bladder problems got worse. He is having a GI workup and also seeing a urologist and now feels that things are going to get resolved. He tends to ramble a bit but does not have any other manic symptoms and states that he sleeping very well Depression         Associated symptoms include no decreased concentration  and no suicidal ideas.  Review of Systems  Constitutional:       With change from lithium  HENT:       Allergies  Eyes: Negative.   Respiratory: Negative.   Cardiovascular: Negative.   Gastrointestinal: Negative.   Genitourinary: Positive for enuresis.  Musculoskeletal: Positive for arthralgias and back pain.  Neurological: Negative for light-headedness.  Psychiatric/Behavioral: Positive for depression. Negative for behavioral problems, confusion, decreased concentration, dysphoric mood, hallucinations, self-injury, sleep disturbance and suicidal ideas. The patient is not nervous/anxious and is not hyperactive.     Physical Exam  Psychiatric:  See mental status  examination    PTSD Symptoms: Ever had a traumatic exposure:  No  Traumatic Brain Injury: Yes Blunt Trauma 6 or 7 months ago  Past psychiatric history. Patient has one psychiatric hospitalization few months ago when he was traveling [place].  At that time he was not taking lithium and the Seroquel was started 25 mg and Depakote 250 mg.  Patient denies any history of psychosis or paranoia but admitted mania and depression and severe mood swing.  He denies any history of suicidal attempt .    Alcohol and substance use history. 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  Past Medical History:   Past Medical History:  Diagnosis Date  . Adenomatous polyps 2004   per H&P in echart by Dr. Arnoldo Morale  . Bipolar disorder ([place])   . BPH (benign prostatic hyperplasia)   . Chronic kidney disease   . Diabetes mellitus   . Genital warts   . High cholesterol   . Rheumatoid arthritis(714.0)   . S/P colonoscopy Jan 2008   diverticulosis in sigmoid, Dr. Arnoldo Morale   History of Loss of Consciousness:  Yes Seizure History:  No Cardiac History:  No Allergies:   Allergies  Allergen Reactions  . Penicillins Shortness Of Breath    Shortness of breath/ tightning in throat  . Lithium     Caused renal failure   Current Medications:  Current Outpatient Prescriptions  Medication Sig Dispense Refill  . acetaminophen (TYLENOL) 650 MG CR tablet Take 1,300 mg by mouth every 8 (eight) hours as needed for pain.    Marland Kitchen aspirin EC 81 MG tablet Take 1 tablet (81 mg total) by mouth daily. 30 tablet 1  . atorvastatin (LIPITOR) 10 MG tablet Take 1 tablet (10 mg total) by mouth daily at 6 PM. 30 tablet 3  . canagliflozin (INVOKANA) 100 MG TABS tablet Take 100 mg by mouth daily before breakfast.    . glipiZIDE (GLUCOTROL XL) 10 MG 24 hr tablet Take 10 mg by mouth 2 (two) times daily.    Marland Kitchen lisinopril  (PRINIVIL,ZESTRIL) 5 MG tablet Take 5 mg by mouth daily.    . metroNIDAZOLE (FLAGYL) 500 MG tablet Take 1 tablet (500 mg total) by mouth 3 (three) times daily. 30 tablet 0  . polyethylene glycol-electrolytes (TRILYTE) 420 g solution Take 4,000 mLs by mouth as directed. 4000 mL 0  . QUEtiapine (SEROQUEL) 25 MG tablet Take 1 tablet (25 mg total) by mouth at bedtime. 90 tablet 2  . tamsulosin (FLOMAX) 0.4 MG CAPS capsule Take 0.4 mg by mouth daily.    . traMADol (ULTRAM) 50 MG tablet Take 50 mg by mouth 2 (two) times daily.    . divalproex (DEPAKOTE) 500 MG DR tablet Take three at bedtime 90 tablet 2   No  current facility-administered medications for this visit.     Social History: Current Place of Residence: [place] [id number] Place of Birth: [place], [place] Family Members: Wife Marital Status:  Married Children: 2  Sons: 2  Daughters: 0 Relationships: wife Education:  Dentist Problems/Performance: none Religious Beliefs/Practices: [employer]ian History of Abuse: none Pensions consultant; Farming and trucking, Financial trader History:  None. Legal History: incarcerations for safety of self or others Hobbies/Interests: gardening fishing  Family History:   Family History  Problem Relation Age of Onset  . Dementia Mother   . Schizophrenia Father   . Diabetes Father   . Heart disease Maternal Grandfather   . Diabetes Maternal Grandfather   . Heart disease Maternal Grandmother   . Diabetes Maternal Grandmother   . Heart disease Paternal Grandfather   . Diabetes Paternal Grandfather   . Heart disease Paternal Grandmother   . Diabetes Paternal Grandmother   . Seizures Sister   . Hypertension Sister   . Heart attack Brother   . Colon cancer Neg Hx     Mental status examination. Patient is obese male who is casually dressed and fairly groomed.  His speech is clear and understandable  He described his mood as good as usual he  is somewhat irritable He denies any active or passive suicidal thoughts or homicidal thoughts.  He denies any auditory or visual hallucination.  There were no paranoia or delusion obsession present at this time.  His psychomotor activity is within normal limits.  No  flight of idea [place] or any loose association.  He's alert and oriented x3.  His insight judgment and impulse control is okay.  Assessment:   Axis I bipolar disorder1 Axis II deferred Axis III diabetes mellitus, obesity, recent renal failure secondary to lithium Axis IV mild to moderate Axis V 75  Plan The patient will continue Depakote  1500 mg at bedtime but change to the DR form due to cost. His primary physician recently did his Depakote level and we will get this sent to [place]. He will continue Seroquel 25 mg at bedtime. He'll return in 6 months time spent  15 minutes.  More than 50% of the time spent in psychoeducation, counseling and coordination of care.  Medical Decision Making Problem Points:  Established problem, stable/improving (1), Review of last therapy session (1) and Review of psycho-social stressors (1) Data Points:  Review or order clinical lab tests (1) Review of medication regiment & side effects (2) Review of new medications or change in dosage (2)  I certify that outpatient services furnished can reasonably be expected to improve the patient's condition.   Levonne Spiller, MD,     Patient ID: FLABIO VRADENBURG, male   DOB: 1950/08/12, 65 y.o.   MRN: [id number]

## 2016-01-22 NOTE — Telephone Encounter (Signed)
Spoke with the pt, he wants a refill on the flagyl. He said it helps him a lot.   Tretha Sciara, pt also wants to know if he can change his preop appt to 02/28/16?

## 2016-01-23 DIAGNOSIS — R197 Diarrhea, unspecified: Secondary | ICD-10-CM | POA: Diagnosis not present

## 2016-01-23 DIAGNOSIS — F418 Other specified anxiety disorders: Secondary | ICD-10-CM | POA: Diagnosis not present

## 2016-01-23 DIAGNOSIS — E669 Obesity, unspecified: Secondary | ICD-10-CM | POA: Diagnosis not present

## 2016-01-23 DIAGNOSIS — Z87442 Personal history of urinary calculi: Secondary | ICD-10-CM | POA: Diagnosis not present

## 2016-01-23 DIAGNOSIS — Z6836 Body mass index (BMI) 36.0-36.9, adult: Secondary | ICD-10-CM | POA: Diagnosis not present

## 2016-01-23 DIAGNOSIS — R3915 Urgency of urination: Secondary | ICD-10-CM | POA: Diagnosis not present

## 2016-01-23 DIAGNOSIS — F319 Bipolar disorder, unspecified: Secondary | ICD-10-CM | POA: Diagnosis not present

## 2016-01-23 DIAGNOSIS — N419 Inflammatory disease of prostate, unspecified: Secondary | ICD-10-CM | POA: Diagnosis not present

## 2016-01-23 DIAGNOSIS — Z880 Allergy status to penicillin: Secondary | ICD-10-CM | POA: Diagnosis not present

## 2016-01-23 DIAGNOSIS — E119 Type 2 diabetes mellitus without complications: Secondary | ICD-10-CM | POA: Diagnosis not present

## 2016-01-23 DIAGNOSIS — N4281 Prostatodynia syndrome: Secondary | ICD-10-CM | POA: Diagnosis not present

## 2016-01-23 DIAGNOSIS — Z79899 Other long term (current) drug therapy: Secondary | ICD-10-CM | POA: Diagnosis not present

## 2016-01-23 NOTE — Telephone Encounter (Signed)
Pre-op changed to 02/28/16 at 10:00am. Called and informed pt. Letter also mailed.

## 2016-01-23 NOTE — Telephone Encounter (Signed)
He was already given 10 day supply of flagyl at time of ov 01/20/16

## 2016-02-11 NOTE — Progress Notes (Signed)
With negative stool studies, no indication for flagyl at this time. Await pending TCS.

## 2016-02-12 ENCOUNTER — Telehealth: Payer: Self-pay | Admitting: Internal Medicine

## 2016-02-12 MED ORDER — METRONIDAZOLE 500 MG PO TABS: 500.0000 mg | ORAL_TABLET | Freq: Three times a day (TID) | ORAL | 0 refills | Status: DC

## 2016-02-12 NOTE — Telephone Encounter (Signed)
Spoke with the pt, tried to tell him that we couldn't send in another rx for flagyl and he wants to know what he is supposed to do? He said he is having watery diarrhea 6-7 times a day, pain in the same area as before. He has finished the flagyl he was given at his ov and now he is back like he was before.

## 2016-02-12 NOTE — Telephone Encounter (Addendum)
Doing no one his colonoscopy is not scheduled to January 22. I saw him on December 11.  One time RX for Flagyl sent to pharmacy just now,  since he had improvement of symptoms.

## 2016-02-12 NOTE — Addendum Note (Signed)
Addended by: Mahala Menghini on: 02/12/2016 04:17 PM   Modules accepted: Orders

## 2016-02-12 NOTE — Telephone Encounter (Signed)
Pt called to say that he was out of his prescription and he needed it called into [facility] in [facility] ASAP. I asked what was the name of the prescription and he said, "This is getting ridiculous. You have it on file. Look it up." He did say it was 10 days taking 3 a day. He asked if we were going to call him back. I told him that he could call his pharmacy.

## 2016-02-13 NOTE — Telephone Encounter (Signed)
Pt is aware. He picked it up last night and started taking it and said he is already feeling better.

## 2016-02-21 ENCOUNTER — Other Ambulatory Visit (HOSPITAL_COMMUNITY): Payer: Self-pay

## 2016-02-21 ENCOUNTER — Ambulatory Visit (HOSPITAL_COMMUNITY): Payer: Self-pay | Admitting: Psychiatry

## 2016-02-26 ENCOUNTER — Other Ambulatory Visit (HOSPITAL_COMMUNITY): Payer: Self-pay

## 2016-02-26 NOTE — Patient Instructions (Signed)
Andrew Warren  02/26/2016     @PREFPERIOPPHARMACY @   Your procedure is scheduled on  03/02/2016  Report to [facility] at  27  A.M.  Call this number if you have problems the morning of surgery:  [phone]   Remember:  Do not eat food or drink liquids after midnight.  Take these medicines the morning of surgery with A SIP OF WATER depakote, lisinopril,flomax, ultram.   Do not wear jewelry, make-up or nail polish.  Do not wear lotions, powders, or perfumes, or deoderant.  Do not shave 48 hours prior to surgery.  Men may shave face and neck.  Do not bring valuables to the hospital.  [facility] is not responsible for any belongings or valuables.  Contacts, dentures or bridgework may not be worn into surgery.  Leave your suitcase in the car.  After surgery it may be brought to your room.  For patients admitted to the hospital, discharge time will be determined by your treatment team.  Patients discharged the day of surgery will not be allowed to drive home.   Name and phone number of your driver:   Family Special instructions:  Follow the diet and prep instructions given to you by Dr Roseanne Kaufman office.  Please read over the following fact sheets that you were given. Anesthesia Post-op Instructions and Care and Recovery After Surgery       Colonoscopy, Adult A colonoscopy is an exam to look at the large intestine. It is done to check for problems, such as:  Lumps (tumors).  Growths (polyps).  Swelling (inflammation).  Bleeding. What happens before the procedure? Eating and drinking Follow instructions from your doctor about eating and drinking. These instructions may include:  A few days before the procedure - follow a low-fiber diet.  Avoid nuts.  Avoid seeds.  Avoid dried fruit.  Avoid raw fruits.  Avoid vegetables.  1-3 days before the procedure - follow a clear liquid diet. Avoid liquids that have red or purple dye. Drink only clear  liquids, such as:  Clear broth or bouillon.  Black coffee or tea.  Clear juice.  Clear soft drinks or sports drinks.  [place].  Popsicles.  On the day of the procedure - do not eat or drink anything during the 2 hours before the procedure. Bowel prep If you were prescribed an oral bowel prep:  Take it as told by your doctor. Starting the day before your procedure, you will need to drink a lot of liquid. The liquid will cause you to poop (have bowel movements) until your poop is almost clear or light green.  If your skin or butt gets irritated from diarrhea, you may:  Wipe the area with wipes that have medicine in them, such as adult wet wipes with aloe and vitamin E.  Put something on your skin that soothes the area, such as petroleum jelly.  If you throw up (vomit) while drinking the bowel prep, take a break for up to 60 minutes. Then begin the bowel prep again. If you keep throwing up and you cannot take the bowel prep without throwing up, call your doctor. General instructions  Ask your doctor about changing or stopping your normal medicines. This is important if you take diabetes medicines or blood thinners.  Plan to have someone take you home from the hospital or clinic. What happens during the procedure?  An IV tube may be put into one of  your veins.  You will be given medicine to help you relax (sedative).  To reduce your risk of infection:  Your doctors will wash their hands.  Your anal area will be washed with soap.  You will be asked to lie on your side with your knees bent.  Your doctor will get a long, thin, flexible tube ready. The tube will have a camera and a light on the end.  The tube will be put into your anus.  The tube will be gently put into your large intestine.  Air will be delivered into your large intestine to keep it open. You may feel some pressure or cramping.  The camera will be used to take photos.  A small tissue sample may  be removed from your body to be looked at under a microscope (biopsy). If any possible problems are found, the tissue will be sent to a lab for testing.  If small growths are found, your doctor may remove them and have them checked for cancer.  The tube that was put into your anus will be slowly removed. The procedure may vary among doctors and hospitals. What happens after the procedure?  Your doctor will check on you often until the medicines you were given have worn off.  Do not drive for 24 hours after the procedure.  You may have a small amount of blood in your poop.  You may pass gas.  You may have mild cramps or bloating in your belly (abdomen).  It is up to you to get the results of your procedure. Ask your doctor, or the department performing the procedure, when your results will be ready. This information is not intended to replace advice given to you by your health care provider. Make sure you discuss any questions you have with your health care provider. Document Released: 02/28/2010 Document Revised: 10/03/2015 Document Reviewed: 04/09/2015 Elsevier Interactive Patient Education  2017 [employer]  Colonoscopy, Adult, Care After This sheet gives you information about how to care for yourself after your procedure. Your doctor may also give you more specific instructions. If you have problems or questions, call your doctor. Follow these instructions at home: General instructions  For the first 24 hours after the procedure:  Do not drive or use machinery.  Do not sign important documents.  Do not drink alcohol.  Do your daily activities more slowly than normal.  Eat foods that are soft and easy to digest.  Rest often.  Take over-the-counter or prescription medicines only as told by your doctor.  It is up to you to get the results of your procedure. Ask your doctor, or the department performing the procedure, when your results will be ready. To help cramping  and bloating:  Try walking around.  Put heat on your belly (abdomen) as told by your doctor. Use a heat source that your doctor recommends, such as a moist heat pack or a heating pad.  Put a towel between your skin and the heat source.  Leave the heat on for 20-30 minutes.  Remove the heat if your skin turns bright red. This is especially important if you cannot feel pain, heat, or cold. You can get burned. Eating and drinking  Drink enough fluid to keep your pee (urine) clear or pale yellow.  Return to your normal diet as told by your doctor. Avoid heavy or fried foods that are hard to digest.  Avoid drinking alcohol for as long as told by your doctor. Contact a  doctor if:  You have blood in your poop (stool) 2-3 days after the procedure. Get help right away if:  You have more than a small amount of blood in your poop.  You see large clumps of tissue (blood clots) in your poop.  Your belly is swollen.  You feel sick to your stomach (nauseous).  You throw up (vomit).  You have a fever.  You have belly pain that gets worse, and medicine does not help your pain. This information is not intended to replace advice given to you by your health care provider. Make sure you discuss any questions you have with your health care provider. Document Released: 02/28/2010 Document Revised: 10/21/2015 Document Reviewed: 10/21/2015 Elsevier Interactive Patient Education  2017 [place] Anesthesia is a term that refers to techniques, procedures, and medicines that help a person stay safe and comfortable during a medical procedure. Monitored anesthesia care, or sedation, is one type of anesthesia. Your anesthesia specialist may recommend sedation if you will be having a procedure that does not require you to be unconscious, such as:  Cataract surgery.  A dental procedure.  A biopsy.  A colonoscopy. During the procedure, you may receive a medicine to help  you relax (sedative). There are three levels of sedation:  Mild sedation. At this level, you may feel awake and relaxed. You will be able to follow directions.  Moderate sedation. At this level, you will be sleepy. You may not remember the procedure.  Deep sedation. At this level, you will be asleep. You will not remember the procedure. The more medicine you are given, the deeper your level of sedation will be. Depending on how you respond to the procedure, the anesthesia specialist may change your level of sedation or the type of anesthesia to fit your needs. An anesthesia specialist will monitor you closely during the procedure. Let your health care provider know about:  Any allergies you have.  All medicines you are taking, including vitamins, herbs, eye drops, creams, and over-the-counter medicines.  Any use of steroids (by mouth or as a cream).  Any problems you or family members have had with sedatives and anesthetic medicines.  Any blood disorders you have.  Any surgeries you have had.  Any medical conditions you have, such as sleep apnea.  Whether you are pregnant or may be pregnant.  Any use of cigarettes, alcohol, or street drugs. What are the risks? Generally, this is a safe procedure. However, problems may occur, including:  Getting too much medicine (oversedation).  Nausea.  Allergic reaction to medicines.  Trouble breathing. If this happens, a breathing tube may be used to help with breathing. It will be removed when you are awake and breathing on your own.  Heart trouble.  Lung trouble. Before the procedure Staying hydrated  Follow instructions from your health care provider about hydration, which may include:  Up to 2 hours before the procedure - you may continue to drink clear liquids, such as water, clear fruit juice, black coffee, and plain tea. Eating and drinking restrictions  Follow instructions from your health care provider about eating and  drinking, which may include:  8 hours before the procedure - stop eating heavy meals or foods such as meat, fried foods, or fatty foods.  6 hours before the procedure - stop eating light meals or foods, such as toast or cereal.  6 hours before the procedure - stop drinking milk or drinks that contain milk.  2 hours  before the procedure - stop drinking clear liquids. Medicines  Ask your health care provider about:  Changing or stopping your regular medicines. This is especially important if you are taking diabetes medicines or blood thinners.  Taking medicines such as aspirin and ibuprofen. These medicines can thin your blood. Do not take these medicines before your procedure if your health care provider instructs you not to. Tests and exams  You will have a physical exam.  You may have blood tests done to show:  How well your kidneys and liver are working.  How well your blood can clot.  General instructions  Plan to have someone take you home from the hospital or clinic.  If you will be going home right after the procedure, plan to have someone with you for 24 hours. What happens during the procedure?  Your blood pressure, heart rate, breathing, level of pain and overall condition will be monitored.  An IV tube will be inserted into one of your veins.  Your anesthesia specialist will give you medicines as needed to keep you comfortable during the procedure. This may mean changing the level of sedation.  The procedure will be performed. After the procedure  Your blood pressure, heart rate, breathing rate, and blood oxygen level will be monitored until the medicines you were given have worn off.  Do not drive for 24 hours if you received a sedative.  You may:  Feel sleepy, clumsy, or nauseous.  Feel forgetful about what happened after the procedure.  Have a sore throat if you had a breathing tube during the procedure.  Vomit. This information is not intended to  replace advice given to you by your health care provider. Make sure you discuss any questions you have with your health care provider. Document Released: 10/22/2004 Document Revised: 07/05/2015 Document Reviewed: 05/19/2015 Elsevier Interactive Patient Education  2017 [place], Care After These instructions provide you with information about caring for yourself after your procedure. Your health care provider may also give you more specific instructions. Your treatment has been planned according to current medical practices, but problems sometimes occur. Call your health care provider if you have any problems or questions after your procedure. What can I expect after the procedure? After your procedure, it is common to:  Feel sleepy for several hours.  Feel clumsy and have poor balance for several hours.  Feel forgetful about what happened after the procedure.  Have poor judgment for several hours.  Feel nauseous or vomit.  Have a sore throat if you had a breathing tube during the procedure. Follow these instructions at home: For at least 24 hours after the procedure:   Do not:  Participate in activities in which you could fall or become injured.  Drive.  Use heavy machinery.  Drink alcohol.  Take sleeping pills or medicines that cause drowsiness.  Make important decisions or sign legal documents.  Take care of children on your own.  Rest. Eating and drinking  Follow the diet that is recommended by your health care provider.  If you vomit, drink water, juice, or soup when you can drink without vomiting.  Make sure you have little or no nausea before eating solid foods. General instructions  Have a responsible adult stay with you until you are awake and alert.  Take over-the-counter and prescription medicines only as told by your health care provider.  If you smoke, do not smoke without supervision.  Keep all follow-up visits as told  by your health care provider. This is important. Contact a health care provider if:  You keep feeling nauseous or you keep vomiting.  You feel light-headed.  You develop a rash.  You have a fever. Get help right away if:  You have trouble breathing. This information is not intended to replace advice given to you by your health care provider. Make sure you discuss any questions you have with your health care provider. Document Released: 05/19/2015 Document Revised: 09/18/2015 Document Reviewed: 05/19/2015 Elsevier Interactive Patient Education  2017 [employer].

## 2016-02-28 ENCOUNTER — Other Ambulatory Visit: Payer: Self-pay

## 2016-02-28 ENCOUNTER — Encounter (HOSPITAL_COMMUNITY)
Admission: RE | Admit: 2016-02-28 | Discharge: 2016-02-28 | Disposition: A | Discharge status: Home / Self Care | Payer: Medicare Other | Source: Ambulatory Visit | Attending: Internal Medicine | Admitting: Internal Medicine

## 2016-02-28 ENCOUNTER — Encounter (HOSPITAL_COMMUNITY): Payer: Self-pay

## 2016-02-28 DIAGNOSIS — Z0000 Encounter for general adult medical examination without abnormal findings: Secondary | ICD-10-CM | POA: Diagnosis not present

## 2016-02-28 DIAGNOSIS — K635 Polyp of colon: Secondary | ICD-10-CM | POA: Diagnosis not present

## 2016-02-28 DIAGNOSIS — E1165 Type 2 diabetes mellitus with hyperglycemia: Secondary | ICD-10-CM | POA: Diagnosis not present

## 2016-02-28 DIAGNOSIS — M545 Low back pain: Secondary | ICD-10-CM | POA: Diagnosis not present

## 2016-02-28 DIAGNOSIS — F319 Bipolar disorder, unspecified: Secondary | ICD-10-CM | POA: Diagnosis not present

## 2016-02-28 DIAGNOSIS — K5730 Diverticulosis of large intestine without perforation or abscess without bleeding: Secondary | ICD-10-CM | POA: Diagnosis not present

## 2016-02-28 DIAGNOSIS — E782 Mixed hyperlipidemia: Secondary | ICD-10-CM | POA: Diagnosis not present

## 2016-02-28 DIAGNOSIS — Z7982 Long term (current) use of aspirin: Secondary | ICD-10-CM | POA: Diagnosis not present

## 2016-02-28 DIAGNOSIS — Z87891 Personal history of nicotine dependence: Secondary | ICD-10-CM | POA: Diagnosis not present

## 2016-02-28 DIAGNOSIS — Z6838 Body mass index (BMI) 38.0-38.9, adult: Secondary | ICD-10-CM | POA: Diagnosis not present

## 2016-02-28 DIAGNOSIS — N183 Chronic kidney disease, stage 3 (moderate): Secondary | ICD-10-CM | POA: Diagnosis not present

## 2016-02-28 DIAGNOSIS — Z01810 Encounter for preprocedural cardiovascular examination: Secondary | ICD-10-CM | POA: Insufficient documentation

## 2016-02-28 DIAGNOSIS — N189 Chronic kidney disease, unspecified: Secondary | ICD-10-CM | POA: Diagnosis not present

## 2016-02-28 DIAGNOSIS — E7800 Pure hypercholesterolemia, unspecified: Secondary | ICD-10-CM | POA: Diagnosis not present

## 2016-02-28 DIAGNOSIS — E1122 Type 2 diabetes mellitus with diabetic chronic kidney disease: Secondary | ICD-10-CM | POA: Diagnosis not present

## 2016-02-28 DIAGNOSIS — Z7984 Long term (current) use of oral hypoglycemic drugs: Secondary | ICD-10-CM | POA: Diagnosis not present

## 2016-02-28 DIAGNOSIS — K529 Noninfective gastroenteritis and colitis, unspecified: Secondary | ICD-10-CM | POA: Diagnosis not present

## 2016-02-28 DIAGNOSIS — N400 Enlarged prostate without lower urinary tract symptoms: Secondary | ICD-10-CM | POA: Diagnosis not present

## 2016-02-28 DIAGNOSIS — Z86010 Personal history of colonic polyps: Secondary | ICD-10-CM | POA: Diagnosis not present

## 2016-02-28 DIAGNOSIS — Z01812 Encounter for preprocedural laboratory examination: Secondary | ICD-10-CM

## 2016-02-28 DIAGNOSIS — Z79899 Other long term (current) drug therapy: Secondary | ICD-10-CM | POA: Diagnosis not present

## 2016-02-28 LAB — BASIC METABOLIC PANEL
Anion gap: 11 (ref 5–15)
BUN: 24 mg/dL — AB (ref 6–20)
CALCIUM: 8.7 mg/dL — AB (ref 8.9–10.3)
CO2: 23 mmol/L (ref 22–32)
CREATININE: 1.86 mg/dL — AB (ref 0.61–1.24)
Chloride: 103 mmol/L (ref 101–111)
GFR calc non Af Amer: 36 mL/min — ABNORMAL LOW (ref >60)
GFR, EST AFRICAN AMERICAN: 42 mL/min — AB (ref >60)
Glucose, Bld: 187 mg/dL — ABNORMAL HIGH (ref 65–99)
Potassium: 4.7 mmol/L (ref 3.5–5.1)
SODIUM: 137 mmol/L (ref 135–145)

## 2016-02-28 LAB — CBC WITH DIFFERENTIAL/PLATELET
BASOS PCT: 0 %
Basophils Absolute: 0 10*3/uL (ref 0.0–0.1)
EOS ABS: 0.1 10*3/uL (ref 0.0–0.7)
Eosinophils Relative: 1 %
HCT: 45.5 % (ref 39.0–52.0)
Hemoglobin: 14 g/dL (ref 13.0–17.0)
Lymphocytes Relative: 19 %
Lymphs Abs: 2 10*3/uL (ref 0.7–4.0)
MCH: 27 pg (ref 26.0–34.0)
MCHC: 30.8 g/dL (ref 30.0–36.0)
MCV: 87.7 fL (ref 78.0–100.0)
MONO ABS: 0.9 10*3/uL (ref 0.1–1.0)
MONOS PCT: 8 %
Neutro Abs: 7.5 10*3/uL (ref 1.7–7.7)
Neutrophils Relative %: 72 %
Platelets: 204 10*3/uL (ref 150–400)
RBC: 5.19 MIL/uL (ref 4.22–5.81)
RDW: 15.8 % — ABNORMAL HIGH (ref 11.5–15.5)
WBC: 10.5 10*3/uL (ref 4.0–10.5)

## 2016-03-02 ENCOUNTER — Encounter (HOSPITAL_COMMUNITY)
Admission: RE | Disposition: A | Discharge status: Home / Self Care | Payer: Self-pay | Source: Ambulatory Visit | Attending: Internal Medicine

## 2016-03-02 ENCOUNTER — Ambulatory Visit (HOSPITAL_COMMUNITY): Payer: Medicare Other | Admitting: Anesthesiology

## 2016-03-02 ENCOUNTER — Ambulatory Visit (HOSPITAL_COMMUNITY)
Admission: RE | Admit: 2016-03-02 | Discharge: 2016-03-02 | Disposition: A | Discharge status: Home / Self Care | Payer: Medicare Other | Source: Ambulatory Visit | Attending: Internal Medicine | Admitting: Internal Medicine

## 2016-03-02 ENCOUNTER — Encounter (HOSPITAL_COMMUNITY): Payer: Self-pay | Admitting: *Deleted

## 2016-03-02 DIAGNOSIS — Z87891 Personal history of nicotine dependence: Secondary | ICD-10-CM | POA: Insufficient documentation

## 2016-03-02 DIAGNOSIS — Z79899 Other long term (current) drug therapy: Secondary | ICD-10-CM | POA: Insufficient documentation

## 2016-03-02 DIAGNOSIS — E7800 Pure hypercholesterolemia, unspecified: Secondary | ICD-10-CM | POA: Insufficient documentation

## 2016-03-02 DIAGNOSIS — R197 Diarrhea, unspecified: Secondary | ICD-10-CM | POA: Diagnosis not present

## 2016-03-02 DIAGNOSIS — N400 Enlarged prostate without lower urinary tract symptoms: Secondary | ICD-10-CM | POA: Insufficient documentation

## 2016-03-02 DIAGNOSIS — Z86010 Personal history of colonic polyps: Secondary | ICD-10-CM | POA: Diagnosis not present

## 2016-03-02 DIAGNOSIS — K529 Noninfective gastroenteritis and colitis, unspecified: Secondary | ICD-10-CM | POA: Diagnosis not present

## 2016-03-02 DIAGNOSIS — E1122 Type 2 diabetes mellitus with diabetic chronic kidney disease: Secondary | ICD-10-CM | POA: Insufficient documentation

## 2016-03-02 DIAGNOSIS — Z7984 Long term (current) use of oral hypoglycemic drugs: Secondary | ICD-10-CM | POA: Insufficient documentation

## 2016-03-02 DIAGNOSIS — F319 Bipolar disorder, unspecified: Secondary | ICD-10-CM | POA: Diagnosis not present

## 2016-03-02 DIAGNOSIS — K5730 Diverticulosis of large intestine without perforation or abscess without bleeding: Secondary | ICD-10-CM

## 2016-03-02 DIAGNOSIS — Z1211 Encounter for screening for malignant neoplasm of colon: Secondary | ICD-10-CM

## 2016-03-02 DIAGNOSIS — K635 Polyp of colon: Secondary | ICD-10-CM | POA: Diagnosis not present

## 2016-03-02 DIAGNOSIS — N189 Chronic kidney disease, unspecified: Secondary | ICD-10-CM | POA: Insufficient documentation

## 2016-03-02 DIAGNOSIS — Z7982 Long term (current) use of aspirin: Secondary | ICD-10-CM | POA: Diagnosis not present

## 2016-03-02 HISTORY — PX: COLONOSCOPY WITH PROPOFOL: SHX5780

## 2016-03-02 HISTORY — PX: POLYPECTOMY: SHX5525

## 2016-03-02 HISTORY — PX: BIOPSY: SHX5522

## 2016-03-02 LAB — GLUCOSE, CAPILLARY
GLUCOSE-CAPILLARY: 162 mg/dL — AB (ref 65–99)
Glucose-Capillary: 61 mg/dL — ABNORMAL LOW (ref 65–99)

## 2016-03-02 SURGERY — COLONOSCOPY WITH PROPOFOL
Anesthesia: Monitor Anesthesia Care

## 2016-03-02 MED ORDER — CHLORHEXIDINE GLUCONATE CLOTH 2 % EX PADS: 6.0000 | MEDICATED_PAD | Freq: Once | CUTANEOUS | Status: DC

## 2016-03-02 MED ORDER — LIDOCAINE HCL (CARDIAC) 10 MG/ML IV SOLN
INTRAVENOUS | Status: DC | PRN
  Administered 2016-03-02: 50 mg via INTRAVENOUS

## 2016-03-02 MED ORDER — MIDAZOLAM HCL 2 MG/2ML IJ SOLN
0.5000 mg | INTRAMUSCULAR | Status: DC | PRN
Start: 2016-03-02 — End: 2016-03-02
  Administered 2016-03-02: 2 mg via INTRAVENOUS

## 2016-03-02 MED ORDER — MIDAZOLAM HCL 2 MG/2ML IJ SOLN
INTRAMUSCULAR | Status: AC
  Filled 2016-03-02: qty 2

## 2016-03-02 MED ORDER — PROPOFOL 10 MG/ML IV BOLUS
INTRAVENOUS | Status: AC
  Filled 2016-03-02: qty 20

## 2016-03-02 MED ORDER — DEXTROSE 50 % IV SOLN
INTRAVENOUS | Status: AC
  Filled 2016-03-02: qty 50

## 2016-03-02 MED ORDER — FENTANYL CITRATE (PF) 100 MCG/2ML IJ SOLN
25.0000 ug | INTRAMUSCULAR | Status: DC | PRN
  Administered 2016-03-02: 25 ug via INTRAVENOUS

## 2016-03-02 MED ORDER — FENTANYL CITRATE (PF) 100 MCG/2ML IJ SOLN
INTRAMUSCULAR | Status: AC
  Filled 2016-03-02: qty 2

## 2016-03-02 MED ORDER — PROPOFOL 500 MG/50ML IV EMUL
INTRAVENOUS | Status: DC | PRN
  Administered 2016-03-02: 75 ug/kg/min via INTRAVENOUS

## 2016-03-02 MED ORDER — PROPOFOL 10 MG/ML IV BOLUS
INTRAVENOUS | Status: DC | PRN
  Administered 2016-03-02 (×2): 10 mg via INTRAVENOUS

## 2016-03-02 MED ORDER — LIDOCAINE HCL (PF) 1 % IJ SOLN
INTRAMUSCULAR | Status: AC
  Filled 2016-03-02: qty 15

## 2016-03-02 MED ORDER — DEXTROSE 50 % IV SOLN
1.0000 | Freq: Once | INTRAVENOUS | Status: AC
  Administered 2016-03-02: 50 mL via INTRAVENOUS

## 2016-03-02 MED ORDER — LACTATED RINGERS IV SOLN
INTRAVENOUS | Status: DC
  Administered 2016-03-02: 10:00:00 via INTRAVENOUS

## 2016-03-02 NOTE — H&P (Signed)
@LOGO @   Primary Care Physician:  [name], MD Primary Gastroenterologist:  Dr. Gala Romney  Pre-Procedure History & Physical: HPI:  Andrew Warren is a 66 y.o. male is here for a  colonoscopy.  History of colonic adenomas. History of fairly frequent chronic intermittent nonbloody diarrhea. History of colitis.    Past Medical History:  Diagnosis Date  . Adenomatous polyps 2004   per H&P in echart by Dr. Arnoldo Morale  . Bipolar disorder ([place])   . BPH (benign prostatic hyperplasia)   . Chronic kidney disease   . Diabetes mellitus   . Genital warts   . High cholesterol   . Rheumatoid arthritis(714.0)   . S/P colonoscopy Jan 2008   diverticulosis in sigmoid, Dr. Arnoldo Morale    Past Surgical History:  Procedure Laterality Date  . APPENDECTOMY    . GALLBLADDER SURGERY  11/2014  . varicocele repair      Prior to Admission medications   Medication Sig Start Date End Date Taking? Authorizing Provider  acetaminophen (TYLENOL) 650 MG CR tablet Take 1,300 mg by mouth every 8 (eight) hours as needed for pain.   Yes Historical Provider, MD  atorvastatin (LIPITOR) 10 MG tablet Take 1 tablet (10 mg total) by mouth daily at 6 PM. 03/27/14  Yes Kathie Dike, MD  canagliflozin (INVOKANA) 100 MG TABS tablet Take 100 mg by mouth daily before breakfast.   Yes Historical Provider, MD  Coenzyme Q10 (CO Q 10 PO) Take 1 capsule by mouth daily.   Yes Historical Provider, MD  divalproex (DEPAKOTE) 500 MG DR tablet Take three at bedtime Patient taking differently: Take 1,500 mg by mouth at bedtime. Take three at bedtime 01/22/16  Yes Cloria Spring, MD  glipiZIDE (GLUCOTROL XL) 10 MG 24 hr tablet Take 10 mg by mouth 2 (two) times daily.   Yes Historical Provider, MD  lisinopril (PRINIVIL,ZESTRIL) 5 MG tablet Take 5 mg by mouth daily.   Yes Historical Provider, MD  polyethylene glycol-electrolytes (TRILYTE) 420 g solution Take 4,000 mLs by mouth as directed. Patient taking differently: Take 4,000 mLs by mouth  as directed. WILL TAKE PRIOR TO PROCEDURE 01/20/16  Yes Daneil Dolin, MD  QUEtiapine (SEROQUEL) 25 MG tablet Take 1 tablet (25 mg total) by mouth at bedtime. 01/22/16  Yes Cloria Spring, MD  tamsulosin (FLOMAX) 0.4 MG CAPS capsule Take 0.4 mg by mouth daily.   Yes Historical Provider, MD  traMADol (ULTRAM) 50 MG tablet Take 50 mg by mouth 2 (two) times daily.   Yes Historical Provider, MD  aspirin EC 81 MG tablet Take 1 tablet (81 mg total) by mouth daily. Patient not taking: Reported on 02/26/2016 03/27/14   Kathie Dike, MD  metroNIDAZOLE (FLAGYL) 500 MG tablet Take 1 tablet (500 mg total) by mouth 3 (three) times daily. Patient not taking: Reported on 02/26/2016 02/12/16   Mahala Menghini, PA-C    Allergies as of 01/20/2016 - Review Complete 01/20/2016  Allergen Reaction Noted  . Penicillins Shortness Of Breath 11/27/2010  . Lithium  10/13/2012    Family History  Problem Relation Age of Onset  . Dementia Mother   . Schizophrenia Father   . Diabetes Father   . Heart disease Maternal Grandfather   . Diabetes Maternal Grandfather   . Heart disease Maternal Grandmother   . Diabetes Maternal Grandmother   . Heart disease Paternal Grandfather   . Diabetes Paternal Grandfather   . Heart disease Paternal Grandmother   . Diabetes Paternal Grandmother   .  Seizures Sister   . Hypertension Sister   . Heart attack Brother   . Colon cancer Neg Hx     Social History   Social History  . Marital status: Married    Spouse name: N/A  . Number of children: N/A  . Years of education: N/A   Occupational History  . Not on file.   Social History Main Topics  . Smoking status: Former Smoker    Packs/day: 3.00    Years: 30.00    Types: Cigarettes    Quit date: 08/09/2005  . Smokeless tobacco: Never Used  . Alcohol use No  . Drug use: No  . Sexual activity: Yes   Other Topics Concern  . Not on file   Social History Narrative  . No narrative on file    Review of Systems: See HPI,  otherwise negative ROS  Physical Exam: BP 123/73   Pulse 99   Temp 98 F (36.7 C) (Oral)   Resp 18   Ht 5\' 9"  (1.753 m)   Wt 252 lb (114.3 kg)   BMI 37.21 kg/m  General:   Alert,  Well-developed, well-nourished, pleasant and cooperative in NAD Mouth:  No deformity or lesions, dentition normal. Neck:  Supple; no masses or thyromegaly. Lungs:  Clear throughout to auscultation.   No wheezes, crackles, or rhonchi. No acute distress. Heart:  Regular rate and rhythm; no murmurs, clicks, rubs,  or gallops. Abdomen:  Obese. Soft, nontender and nondistended. No masses, hepatosplenomegaly or hernias noted. Normal bowel sounds, without guarding, and without rebound.    Impression/Plan: Andrew Warren is now here to undergo a surveillance colonoscopy. History of colonic adenomas. History of chronic diarrhea. As discussed with the patient, may benefit from segmental biopsies to evaluate for microscopic colitis.  Further recommendations to follow.  The risks, benefits, limitations, alternatives and imponderables have been reviewed with the patient. Questions have been answered. All parties are agreeable.      Risks, benefits, limitations, imponderables and alternatives regarding colonoscopy have been reviewed with the patient. Questions have been answered. All parties agreeable.     Notice:  This dictation was prepared with Dragon dictation along with smaller phrase technology. Any transcriptional errors that result from this process are unintentional and may not be corrected upon review.

## 2016-03-02 NOTE — Op Note (Signed)
[facility] Patient Name: Andrew Warren Procedure Date: 03/02/2016 10:17 AM MRN: [id number] Date of Birth: Oct 03, 1950 Attending MD: Norvel Richards , MD CSN: [id number] Age: 66 Admit Type: Outpatient Procedure:                Colonoscopy with snare polypectomy / segmental Bx Indications:              High risk colon cancer surveillance: Personal                            history of colonic polyps Providers:                Norvel Richards, MD, Janeece Riggers, RN, Aram Candela Referring MD:              Medicines:                Propofol per Anesthesia Complications:            No immediate complications. Estimated Blood Loss:     Estimated blood loss was minimal. Procedure:                Pre-Anesthesia Assessment:                           - Prior to the procedure, a History and Physical                            was performed, and patient medications and                            allergies were reviewed. The patient's tolerance of                            previous anesthesia was also reviewed. The risks                            and benefits of the procedure and the sedation                            options and risks were discussed with the patient.                            All questions were answered, and informed consent                            was obtained. Prior Anticoagulants: The patient has                            taken no previous anticoagulant or antiplatelet                            agents. ASA Grade Assessment: II - A patient with  mild systemic disease. After reviewing the risks                            and benefits, the patient was deemed in                            satisfactory condition to undergo the procedure.                           After obtaining informed consent, the colonoscope                            was passed under direct vision. Throughout the          procedure, the patient's blood pressure, pulse, and                            oxygen saturations were monitored continuously. The                            [id number] scope was introduced through                            the and advanced to the the cecum, identified by                            appendiceal orifice and ileocecal valve. The                            ileocecal valve, appendiceal orifice, and rectum                            were photographed. The ileocecal valve, appendiceal                            orifice, and rectum were photographed. The entire                            colon was well visualized. The patient tolerated                            the procedure well. The quality of the bowel                            preparation was adequate. Scope In: 10:24:41 AM Scope Out: 10:47:07 AM Scope Withdrawal Time: 0 hours 14 minutes 30 seconds  Total Procedure Duration: 0 hours 22 minutes 26 seconds  Findings:      The perianal and digital rectal examinations were normal.      Scattered diverticula were found in the sigmoid colon and descending       colon.      A 4 mm polyp was found in the cecum. The polyp was semi-pedunculated.       The polyp was removed with a cold snare. Resection and retrieval were       complete. Estimated blood loss  was minimal. Biopsies of the ascending       and descending/sigmoid taken to evaluate for microscopic colitis Impression:               - Diverticulosis in the sigmoid colon and in the                            descending colon.                           - One 4 mm polyp in the cecum, removed with a cold                            snare. Resected and retrieved. Status post                            segmental biopsy. Moderate Sedation:      Moderate (conscious) sedation was personally administered by an       anesthesia professional. The following parameters were monitored: oxygen       saturation, heart rate,  blood pressure, respiratory rate, EKG, adequacy       of pulmonary ventilation, and response to care. Total physician       intraservice time was 27 minutes. Recommendation:           - Patient has a contact number available for                            emergencies. The signs and symptoms of potential                            delayed complications were discussed with the                            patient. Return to normal activities tomorrow.                            Written discharge instructions were provided to the                            patient.                           - Advance diet as tolerated.                           - Continue present medications.                           - Await pathology results.                           - Repeat colonoscopy date to be determined after                            pending pathology results are reviewed for  surveillance. Procedure Code(s):        --- Professional ---                           [phone], Colonoscopy, flexible; with removal of                            tumor(s), polyp(s), or other lesion(s) by snare                            technique Diagnosis Code(s):        --- Professional ---                           Z86.010, Personal history of colonic polyps                           D12.0, Benign neoplasm of cecum                           K57.30, Diverticulosis of large intestine without                            perforation or abscess without bleeding CPT copyright 2016 American Medical Association. All rights reserved. The codes documented in this report are preliminary and upon coder review may  be revised to meet current compliance requirements. Cristopher Estimable. Deaveon Schoen, MD Norvel Richards, MD 03/02/2016 11:08:29 AM This report has been signed electronically. Number of Addenda: 0

## 2016-03-02 NOTE — Anesthesia Postprocedure Evaluation (Signed)
Anesthesia Post Note  Patient: Andrew Warren  Procedure(s) Performed: Procedure(s) (LRB): COLONOSCOPY WITH PROPOFOL (N/A) BIOPSY POLYPECTOMY  Patient location during evaluation: PACU Anesthesia Type: MAC Level of consciousness: awake and alert and oriented Pain management: pain level controlled Vital Signs Assessment: vitals unstable Respiratory status: spontaneous breathing Cardiovascular status: blood pressure returned to baseline Postop Assessment: no signs of nausea or vomiting Anesthetic complications: no     Last Vitals:  Vitals:   03/02/16 0942  BP: 123/73  Pulse: 99  Resp: 18  Temp: 36.7 C    Last Pain:  Vitals:   03/02/16 0942  TempSrc: Oral  PainSc: 5                  Amandalee Lacap

## 2016-03-02 NOTE — Transfer of Care (Signed)
Immediate Anesthesia Transfer of Care Note  Patient: Andrew Warren  Procedure(s) Performed: Procedure(s) with comments: COLONOSCOPY WITH PROPOFOL (N/A) - 10:00am BIOPSY - biopsy evaluate for microscopic colitis POLYPECTOMY - cecal polyp  Patient Location: PACU  Anesthesia Type:MAC  Level of Consciousness: awake, alert  and oriented  Airway & Oxygen Therapy: Patient Spontanous Breathing  Post-op Assessment: Report given to RN  Post vital signs: Reviewed and stable  Last Vitals:  Vitals:   03/02/16 0942  BP: 123/73  Pulse: 99  Resp: 18  Temp: 36.7 C    Last Pain:  Vitals:   03/02/16 0942  TempSrc: Oral  PainSc: 5       Patients Stated Pain Goal: 0 (80/16/55 3748)  Complications: No apparent anesthesia complications

## 2016-03-02 NOTE — Discharge Instructions (Addendum)
Colonoscopy Discharge Instructions  Read the instructions outlined below and refer to this sheet in the next few weeks. These discharge instructions provide you with general information on caring for yourself after you leave the hospital. Your doctor may also give you specific instructions. While your treatment has been planned according to the most current medical practices available, unavoidable complications occasionally occur. If you have any problems or questions after discharge, call Dr. Gala Romney at [phone]. ACTIVITY  You may resume your regular activity, but move at a slower pace for the next 24 hours.   Take frequent rest periods for the next 24 hours.   Walking will help get rid of the air and reduce the bloated feeling in your belly (abdomen).   No driving for 24 hours (because of the medicine (anesthesia) used during the test).    Do not sign any important legal documents or operate any machinery for 24 hours (because of the anesthesia used during the test).  NUTRITION  Drink plenty of fluids.   You may resume your normal diet as instructed by your doctor.   Begin with a light meal and progress to your normal diet. Heavy or fried foods are harder to digest and may make you feel sick to your stomach (nauseated).   Avoid alcoholic beverages for 24 hours or as instructed.  MEDICATIONS  You may resume your normal medications unless your doctor tells you otherwise.  WHAT YOU CAN EXPECT TODAY  Some feelings of bloating in the abdomen.   Passage of more gas than usual.   Spotting of blood in your stool or on the toilet paper.  IF YOU HAD POLYPS REMOVED DURING THE COLONOSCOPY:  No aspirin products for 7 days or as instructed.   No alcohol for 7 days or as instructed.   Eat a soft diet for the next 24 hours.  FINDING OUT THE RESULTS OF YOUR TEST Not all test results are available during your visit. If your test results are not back during the visit, make an appointment  with your caregiver to find out the results. Do not assume everything is normal if you have not heard from your caregiver or the medical facility. It is important for you to follow up on all of your test results.  SEEK IMMEDIATE MEDICAL ATTENTION IF:  You have more than a spotting of blood in your stool.   Your belly is swollen (abdominal distention).   You are nauseated or vomiting.   You have a temperature over 101.   You have abdominal pain or discomfort that is severe or gets worse throughout the day.    Colon polyp and diverticulosis information provided  Further recommendations to follow pending review of pathology report  Colon Polyps Introduction Polyps are tissue growths inside the body. Polyps can grow in many places, including the large intestine (colon). A polyp may be a round bump or a mushroom-shaped growth. You could have one polyp or several. Most colon polyps are noncancerous (benign). However, some colon polyps can become cancerous over time. What are the causes? The exact cause of colon polyps is not known. What increases the risk? This condition is more likely to develop in people who:  Have a family history of colon cancer or colon polyps.  Are older than 45 or older than 45 if they are African American.  Have inflammatory bowel disease, such as ulcerative colitis or Crohn disease.  Are overweight.  Smoke cigarettes.  Do not get enough exercise.  Drink too  much alcohol.  Eat a diet that is:  High in fat and red meat.  Low in fiber.  Had childhood cancer that was treated with abdominal radiation. What are the signs or symptoms? Most polyps do not cause symptoms. If you have symptoms, they may include:  Blood coming from your rectum when having a bowel movement.  Blood in your stool.The stool may look dark red or black.  A change in bowel habits, such as constipation or diarrhea. How is this diagnosed? This condition is diagnosed with a  colonoscopy. This is a procedure that uses a lighted, flexible scope to look at the inside of your colon. How is this treated? Treatment for this condition involves removing any polyps that are found. Those polyps will then be tested for cancer. If cancer is found, your health care provider will talk to you about options for colon cancer treatment. Follow these instructions at home: Diet  Eat plenty of fiber, such as fruits, vegetables, and whole grains.  Eat foods that are high in calcium and vitamin D, such as milk, cheese, yogurt, eggs, liver, fish, and broccoli.  Limit foods high in fat, red meats, and processed meats, such as hot dogs, sausage, bacon, and lunch meats.  Maintain a healthy weight, or lose weight if recommended by your health care provider. General instructions  Do not smoke cigarettes.  Do not drink alcohol excessively.  Keep all follow-up visits as told by your health care provider. This is important. This includes keeping regularly scheduled colonoscopies. Talk to your health care provider about when you need a colonoscopy.  Exercise every day or as told by your health care provider. Contact a health care provider if:  You have new or worsening bleeding during a bowel movement.  You have new or increased blood in your stool.  You have a change in bowel habits.  You unexpectedly lose weight. This information is not intended to replace advice given to you by your health care provider. Make sure you discuss any questions you have with your health care provider. Document Released: 10/23/2003 Document Revised: 07/04/2015 Document Reviewed: 12/17/2014  2017 Elsevier  Diverticulosis Diverticulosis is the condition that develops when small pouches (diverticula) form in the wall of your colon. Your colon, or large intestine, is where water is absorbed and stool is formed. The pouches form when the inside layer of your colon pushes through weak spots in the outer  layers of your colon. CAUSES  No one knows exactly what causes diverticulosis. RISK FACTORS  Being older than 31. Your risk for this condition increases with age. Diverticulosis is rare in people younger than 40 years. By age 42, almost everyone has it.  Eating a low-fiber diet.  Being frequently constipated.  Being overweight.  Not getting enough exercise.  Smoking.  Taking over-the-counter pain medicines, like aspirin and ibuprofen. SYMPTOMS  Most people with diverticulosis do not have symptoms. DIAGNOSIS  Because diverticulosis often has no symptoms, health care providers often discover the condition during an exam for other colon problems. In many cases, a health care provider will diagnose diverticulosis while using a flexible scope to examine the colon (colonoscopy). TREATMENT  If you have never developed an infection related to diverticulosis, you may not need treatment. If you have had an infection before, treatment may include:  Eating more fruits, vegetables, and grains.  Taking a fiber supplement.  Taking a live bacteria supplement (probiotic).  Taking medicine to relax your colon. HOME CARE INSTRUCTIONS   Drink  at least 6-8 glasses of water each day to prevent constipation.  Try not to strain when you have a bowel movement.  Keep all follow-up appointments. If you have had an infection before:  Increase the fiber in your diet as directed by your health care provider or dietitian.  Take a dietary fiber supplement if your health care provider approves.  Only take medicines as directed by your health care provider. SEEK MEDICAL CARE IF:   You have abdominal pain.  You have bloating.  You have cramps.  You have not gone to the bathroom in 3 days. SEEK IMMEDIATE MEDICAL CARE IF:   Your pain gets worse.  Yourbloating becomes very bad.  You have a fever or chills, and your symptoms suddenly get worse.  You begin vomiting.  You have bowel  movements that are bloody or black. MAKE SURE YOU:  Understand these instructions.  Will watch your condition.  Will get help right away if you are not doing well or get worse. This information is not intended to replace advice given to you by your health care provider. Make sure you discuss any questions you have with your health care provider. Document Released: 10/24/2003 Document Revised: 01/31/2013 Document Reviewed: 12/21/2012 Elsevier Interactive Patient Education  2017 [employer].

## 2016-03-02 NOTE — Anesthesia Preprocedure Evaluation (Signed)
Anesthesia Evaluation  Patient identified by MRN, date of birth, ID band Patient awake    Reviewed: Allergy & Precautions, NPO status , Patient's Chart, lab work & pertinent test results  Airway Mallampati: II  TM Distance: >3 FB     Dental  (+) Edentulous Upper, Partial Lower   Pulmonary former smoker,    breath sounds clear to auscultation       Cardiovascular negative cardio ROS   Rhythm:Regular Rate:Normal     Neuro/Psych PSYCHIATRIC DISORDERS Bipolar Disorder    GI/Hepatic negative GI ROS,   Endo/Other  diabetes, Type 2, Oral Hypoglycemic Agents  Renal/GU      Musculoskeletal   Abdominal   Peds  Hematology   Anesthesia Other Findings   Reproductive/Obstetrics                             Anesthesia Physical Anesthesia Plan  ASA: III  Anesthesia Plan: MAC   Post-op Pain Management:    Induction: Intravenous  Airway Management Planned: Simple Face Mask  Additional Equipment:   Intra-op Plan:   Post-operative Plan:   Informed Consent: I have reviewed the patients History and Physical, chart, labs and discussed the procedure including the risks, benefits and alternatives for the proposed anesthesia with the patient or authorized representative who has indicated his/her understanding and acceptance.     Plan Discussed with:   Anesthesia Plan Comments:         Anesthesia Quick Evaluation

## 2016-03-02 NOTE — Anesthesia Procedure Notes (Signed)
Procedure Name: MAC Date/Time: 03/02/2016 10:15 AM Performed by: Vista Deck Pre-anesthesia Checklist: Patient identified, Emergency Drugs available, Suction available, Timeout performed and Patient being monitored Patient Re-evaluated:Patient Re-evaluated prior to inductionOxygen Delivery Method: Non-rebreather mask

## 2016-03-03 DIAGNOSIS — F319 Bipolar disorder, unspecified: Secondary | ICD-10-CM | POA: Diagnosis not present

## 2016-03-03 DIAGNOSIS — E119 Type 2 diabetes mellitus without complications: Secondary | ICD-10-CM | POA: Diagnosis not present

## 2016-03-03 DIAGNOSIS — Z87442 Personal history of urinary calculi: Secondary | ICD-10-CM | POA: Diagnosis not present

## 2016-03-03 DIAGNOSIS — N429 Disorder of prostate, unspecified: Secondary | ICD-10-CM | POA: Diagnosis not present

## 2016-03-03 DIAGNOSIS — Z880 Allergy status to penicillin: Secondary | ICD-10-CM | POA: Diagnosis not present

## 2016-03-03 DIAGNOSIS — N368 Other specified disorders of urethra: Secondary | ICD-10-CM | POA: Diagnosis not present

## 2016-03-03 DIAGNOSIS — R3915 Urgency of urination: Secondary | ICD-10-CM | POA: Diagnosis not present

## 2016-03-03 DIAGNOSIS — M1990 Unspecified osteoarthritis, unspecified site: Secondary | ICD-10-CM | POA: Diagnosis not present

## 2016-03-04 DIAGNOSIS — Z87442 Personal history of urinary calculi: Secondary | ICD-10-CM | POA: Diagnosis not present

## 2016-03-04 DIAGNOSIS — N368 Other specified disorders of urethra: Secondary | ICD-10-CM | POA: Diagnosis not present

## 2016-03-04 DIAGNOSIS — R3915 Urgency of urination: Secondary | ICD-10-CM | POA: Diagnosis not present

## 2016-03-04 DIAGNOSIS — M1990 Unspecified osteoarthritis, unspecified site: Secondary | ICD-10-CM | POA: Diagnosis not present

## 2016-03-04 DIAGNOSIS — F319 Bipolar disorder, unspecified: Secondary | ICD-10-CM | POA: Diagnosis not present

## 2016-03-04 DIAGNOSIS — Z880 Allergy status to penicillin: Secondary | ICD-10-CM | POA: Diagnosis not present

## 2016-03-04 DIAGNOSIS — E119 Type 2 diabetes mellitus without complications: Secondary | ICD-10-CM | POA: Diagnosis not present

## 2016-03-04 DIAGNOSIS — N429 Disorder of prostate, unspecified: Secondary | ICD-10-CM | POA: Diagnosis not present

## 2016-03-06 ENCOUNTER — Encounter (HOSPITAL_COMMUNITY): Payer: Self-pay | Admitting: Internal Medicine

## 2016-03-07 ENCOUNTER — Encounter: Payer: Self-pay | Admitting: Internal Medicine

## 2016-03-08 DIAGNOSIS — K5900 Constipation, unspecified: Secondary | ICD-10-CM | POA: Diagnosis not present

## 2016-03-08 DIAGNOSIS — T839XXA Unspecified complication of genitourinary prosthetic device, implant and graft, initial encounter: Secondary | ICD-10-CM | POA: Diagnosis not present

## 2016-03-08 DIAGNOSIS — Z7984 Long term (current) use of oral hypoglycemic drugs: Secondary | ICD-10-CM | POA: Diagnosis not present

## 2016-03-08 DIAGNOSIS — Z79899 Other long term (current) drug therapy: Secondary | ICD-10-CM | POA: Diagnosis not present

## 2016-03-08 DIAGNOSIS — E119 Type 2 diabetes mellitus without complications: Secondary | ICD-10-CM | POA: Diagnosis not present

## 2016-03-08 DIAGNOSIS — Z87442 Personal history of urinary calculi: Secondary | ICD-10-CM | POA: Diagnosis not present

## 2016-03-08 DIAGNOSIS — T83091A Other mechanical complication of indwelling urethral catheter, initial encounter: Secondary | ICD-10-CM | POA: Diagnosis not present

## 2016-03-08 DIAGNOSIS — F319 Bipolar disorder, unspecified: Secondary | ICD-10-CM | POA: Diagnosis not present

## 2016-03-09 ENCOUNTER — Encounter: Payer: Self-pay | Admitting: Gastroenterology

## 2016-03-09 ENCOUNTER — Telehealth: Payer: Self-pay

## 2016-03-09 NOTE — Telephone Encounter (Signed)
Letter mailed to the pt. 

## 2016-03-09 NOTE — Telephone Encounter (Signed)
Per RMR-  Send letter to patient.  Send copy of letter with path to referring provider and PCP.   Pt needs an ov w AB in next month or so in reference to diarrhea.

## 2016-03-09 NOTE — Telephone Encounter (Signed)
appt made and letter sent  °

## 2016-04-08 ENCOUNTER — Ambulatory Visit: Payer: Medicare Other | Admitting: Gastroenterology

## 2016-04-10 DIAGNOSIS — M47816 Spondylosis without myelopathy or radiculopathy, lumbar region: Secondary | ICD-10-CM | POA: Diagnosis not present

## 2016-04-10 DIAGNOSIS — M9902 Segmental and somatic dysfunction of thoracic region: Secondary | ICD-10-CM | POA: Diagnosis not present

## 2016-04-10 DIAGNOSIS — M9901 Segmental and somatic dysfunction of cervical region: Secondary | ICD-10-CM | POA: Diagnosis not present

## 2016-04-10 DIAGNOSIS — M546 Pain in thoracic spine: Secondary | ICD-10-CM | POA: Diagnosis not present

## 2016-04-10 DIAGNOSIS — M9903 Segmental and somatic dysfunction of lumbar region: Secondary | ICD-10-CM | POA: Diagnosis not present

## 2016-07-24 ENCOUNTER — Ambulatory Visit (HOSPITAL_COMMUNITY): Payer: Self-pay | Admitting: Psychiatry

## 2016-08-05 ENCOUNTER — Telehealth (HOSPITAL_COMMUNITY): Payer: Self-pay | Admitting: *Deleted

## 2016-08-05 NOTE — Telephone Encounter (Signed)
phone call regarding provider out of office afternoon of 08/10/16, no answer.   cell # not working.

## 2016-08-06 ENCOUNTER — Telehealth (HOSPITAL_COMMUNITY): Payer: Self-pay | Admitting: *Deleted

## 2016-08-06 NOTE — Telephone Encounter (Signed)
Phone call regarding provider out of office to both phone #'s, no answer, and the cell not accepting calls

## 2016-08-10 ENCOUNTER — Telehealth (HOSPITAL_COMMUNITY): Payer: Self-pay | Admitting: *Deleted

## 2016-08-10 ENCOUNTER — Ambulatory Visit (HOSPITAL_COMMUNITY): Payer: Self-pay | Admitting: Psychiatry

## 2016-08-10 ENCOUNTER — Other Ambulatory Visit (HOSPITAL_COMMUNITY): Payer: Self-pay | Admitting: *Deleted

## 2016-08-10 MED ORDER — DIVALPROEX SODIUM 500 MG PO DR TAB: DELAYED_RELEASE_TABLET | ORAL | 2 refills | Status: DC

## 2016-08-10 NOTE — Telephone Encounter (Signed)
Several attempts to contact patient regarding provider out of office afternoon of 08/10/16.   Just received a new cell number from his wife.

## 2016-08-10 NOTE — Telephone Encounter (Signed)
You may send it in

## 2016-08-10 NOTE — Telephone Encounter (Signed)
Patient called in requesting 12 tablet of Depakote to be sent to local [facility] until his Rx comes in from [place]?

## 2016-08-10 NOTE — Telephone Encounter (Signed)
Called patient to inform that I would call in 12 tablets of Depakote.

## 2016-08-14 DIAGNOSIS — E1165 Type 2 diabetes mellitus with hyperglycemia: Secondary | ICD-10-CM | POA: Diagnosis not present

## 2016-08-14 DIAGNOSIS — R5383 Other fatigue: Secondary | ICD-10-CM | POA: Diagnosis not present

## 2016-08-14 DIAGNOSIS — E782 Mixed hyperlipidemia: Secondary | ICD-10-CM | POA: Diagnosis not present

## 2016-08-14 DIAGNOSIS — F319 Bipolar disorder, unspecified: Secondary | ICD-10-CM | POA: Diagnosis not present

## 2016-08-14 DIAGNOSIS — N183 Chronic kidney disease, stage 3 (moderate): Secondary | ICD-10-CM | POA: Diagnosis not present

## 2016-08-17 ENCOUNTER — Ambulatory Visit (INDEPENDENT_AMBULATORY_CARE_PROVIDER_SITE_OTHER): Payer: Medicare Other | Admitting: Psychiatry

## 2016-08-17 ENCOUNTER — Encounter (HOSPITAL_COMMUNITY): Payer: Self-pay | Admitting: Psychiatry

## 2016-08-17 VITALS — BP 134/84 | HR 80 | Ht 69.0 in | Wt 246.0 lb

## 2016-08-17 DIAGNOSIS — F3162 Bipolar disorder, current episode mixed, moderate: Secondary | ICD-10-CM | POA: Diagnosis not present

## 2016-08-17 DIAGNOSIS — Z818 Family history of other mental and behavioral disorders: Secondary | ICD-10-CM

## 2016-08-17 DIAGNOSIS — Z6837 Body mass index (BMI) 37.0-37.9, adult: Secondary | ICD-10-CM | POA: Diagnosis not present

## 2016-08-17 DIAGNOSIS — Z810 Family history of intellectual disabilities: Secondary | ICD-10-CM | POA: Diagnosis not present

## 2016-08-17 DIAGNOSIS — F319 Bipolar disorder, unspecified: Secondary | ICD-10-CM | POA: Diagnosis not present

## 2016-08-17 DIAGNOSIS — M545 Low back pain: Secondary | ICD-10-CM | POA: Diagnosis not present

## 2016-08-17 DIAGNOSIS — N183 Chronic kidney disease, stage 3 (moderate): Secondary | ICD-10-CM | POA: Diagnosis not present

## 2016-08-17 MED ORDER — DIVALPROEX SODIUM 500 MG PO DR TAB: DELAYED_RELEASE_TABLET | ORAL | 3 refills | Status: DC

## 2016-08-17 MED ORDER — QUETIAPINE FUMARATE 25 MG PO TABS: 25.0000 mg | ORAL_TABLET | Freq: Every day | ORAL | 3 refills | Status: DC

## 2016-08-17 NOTE — Progress Notes (Signed)
Patient ID: AMOND SPERANZA, male   DOB: [birth year], 66 y.o.   MRN: [id number] Patient ID: LAJUAN KOVALESKI, male   DOB: 1951-01-13, 66 y.o.   MRN: [id number] Patient ID: HUBER MATHERS, male   DOB: [birth year], 66 y.o.   MRN: [id number] Patient ID: TAVARIOUS FREEL, male   DOB: 11/10/1950, 66 y.o.   MRN: [id number] Patient ID: JASHAUN PENROSE, male   DOB: 09/20/1950, 66 y.o.   MRN: [id number] Patient ID: DAMEN WINDSOR, male   DOB: 1950/03/16, 66 y.o.   MRN: [id number] Patient ID: KHANH TANORI, male   DOB: 1950/06/23, 66 y.o.   MRN: [id number]    Patient Identification:  Andrew Warren Date of Evaluation:  08/17/2016  Chief Complaint:" I'm doing pretty well"  Chief Complaint  Patient presents with  . Follow-up  . Depression  . Manic Behavior   History of Chief Complaint:   This patient is a 66 year old married white male who lives with his wife in [place]. He has 4 grown children. 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  The patient states that when he was a young adult he was manic and out of control. He was initially misdiagnosed as being paranoid schizophrenic. He was in and out of hospitals proximally 7 times between the ages of 68 and 47. He was eventually more accurately diagnosed as being bipolar and was started on lithium. He did very well on lithium for a number of years and was monitored by his family doctor, Dr. Nadara Mustard in [place].  Several months ago however he began to be very oversedated. Apparently his renal function was declining and his lithium level had risen. He was seen by nephrologist who recommended he get off lithium immediately. Since then his family Dr. put him on low doses of Seroquel and Depakote. 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 Since then he's been on his current doses of  medication with some modifications. On 1500 mg of Depakote his blood level is 51. He is on 300 mg of Seroquel with that at bedtime. He is sleeping too soundly to the point of being incontinent at night. He is craving food all the time and has gained about 10 pounds in the last month. He is also type II diabetic and is concerned about how this will affect his blood sugar. Interestingly hot, since getting off lithium his blood sugars have come down. He's no longer manic or hyperactive and his mood is stable but he doesn't like the side effects of incontinence and weight gain.  The patient returns after the expense. Overall he is doing well. He states that his mood is stable although he is a little bit upset about difficulties losing weight. He was using an oral supplement for a while but it's no longer working. He is sleeping well and his energy is good. He continues to enjoy working. Depression         Associated symptoms include no decreased concentration and no suicidal ideas.  Review of Systems  Constitutional:       With change from lithium  HENT:       Allergies  Eyes: Negative.   Respiratory: Negative.   Cardiovascular: Negative.   Gastrointestinal: Negative.   Genitourinary: Positive for enuresis.  Musculoskeletal: Positive for arthralgias and back pain.  Neurological: Negative for  light-headedness.  Psychiatric/Behavioral: Positive for depression. Negative for behavioral problems, confusion, decreased concentration, dysphoric mood, hallucinations, self-injury, sleep disturbance and suicidal ideas. The patient is not nervous/anxious and is not hyperactive.     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 Patient has one psychiatric hospitalization few months ago when he was traveling [place].  At that time he was not taking  lithium and the Seroquel was started 25 mg and Depakote 250 mg.  Patient denies any history of psychosis or paranoia but admitted mania and depression and severe mood swing.  He denies any history of suicidal attempt .    Alcohol and substance use history. 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  Past Medical History:   Past Medical History:  Diagnosis Date  . Adenomatous polyps 2004   per H&P in echart by Dr. Arnoldo Morale  . Bipolar disorder ([place])   . BPH (benign prostatic hyperplasia)   . Chronic kidney disease   . Diabetes mellitus   . Genital warts   . High cholesterol   . Rheumatoid arthritis(714.0)   . S/P colonoscopy Jan 2008   diverticulosis in sigmoid, Dr. Arnoldo Morale   History of Loss of Consciousness:  Yes Seizure History:  No Cardiac History:  No Allergies:   Allergies  Allergen Reactions  . Penicillins Shortness Of Breath    Has patient had a PCN reaction causing immediate rash, facial/tongue/throat swelling, SOB or lightheadedness with hypotension: Yes Has patient had a PCN reaction causing severe rash involving mucus membranes or skin necrosis: No Has patient had a PCN reaction that required hospitalization No Has patient had a PCN reaction occurring within the last 10 years: Yes If all of the above answers are "NO", then may proceed with Cephalosporin use.  Shortness of breath/ tightning in throat  . Lithium     Caused renal failure   Current Medications:  Current Outpatient Prescriptions  Medication Sig Dispense Refill  . acetaminophen (TYLENOL) 650 MG CR tablet Take 1,300 mg by mouth every 8 (eight) hours as needed for pain.    Marland Kitchen atorvastatin (LIPITOR) 10 MG tablet Take 1 tablet (10 mg total) by mouth daily at 6 PM. 30 tablet 3  . Coenzyme Q10 (CO Q 10 PO) Take 1 capsule by mouth daily.    . divalproex (DEPAKOTE) 500 MG DR tablet Take three at  bedtime 270 tablet 3  . glipiZIDE (GLUCOTROL XL) 10 MG 24 hr tablet Take 10 mg by mouth 2 (two) times daily.    Marland Kitchen JANUVIA 100 MG tablet     . lisinopril (PRINIVIL,ZESTRIL) 5 MG tablet Take 5 mg by mouth daily.    . polyethylene glycol-electrolytes (TRILYTE) 420 g solution Take 4,000 mLs by mouth as directed. (Patient taking differently: Take 4,000 mLs by mouth as directed. WILL TAKE PRIOR TO PROCEDURE) 4000 mL 0  . QUEtiapine (SEROQUEL) 25 MG tablet Take 1 tablet (25 mg total) by mouth at bedtime. 90 tablet 3  . tamsulosin (FLOMAX) 0.4 MG CAPS capsule Take 0.4 mg by mouth daily.    . traMADol (ULTRAM) 50 MG tablet Take 50 mg by mouth 2 (two) times daily.     No current facility-administered medications for this visit.  Social History: Current Place of Residence: [place] [address] Place of Birth: [place], [place] Family Members: Wife Marital Status:  Married Children: 2  Sons: 2  Daughters: 0 Relationships: wife Education:  Dentist Problems/Performance: none Religious Beliefs/Practices: [employer]ian History of Abuse: none Pensions consultant; Farming and trucking, Financial trader History:  None. Legal History: incarcerations for safety of self or others Hobbies/Interests: gardening fishing  Family History:   Family History  Problem Relation Age of Onset  . Dementia Mother   . Schizophrenia Father   . Diabetes Father   . Heart disease Maternal Grandfather   . Diabetes Maternal Grandfather   . Heart disease Maternal Grandmother   . Diabetes Maternal Grandmother   . Heart disease Paternal Grandfather   . Diabetes Paternal Grandfather   . Heart disease Paternal Grandmother   . Diabetes Paternal Grandmother   . Seizures Sister   . Hypertension Sister   . Heart attack Brother   . Colon cancer Neg Hx     Mental status examination. Patient is obese male who is casually dressed and fairly groomed.  His speech is clear  and understandable  He described his mood as good And affect is fairly bright and less irritable He denies any active or passive suicidal thoughts or homicidal thoughts.  He denies any auditory or visual hallucination.  There were no paranoia or delusion obsession present at this time.  His psychomotor activity is within normal limits.  No  flight of idea [place] or any loose association.  He's alert and oriented x3.  His insight judgment and impulse control is okay.  Assessment:   Axis I bipolar disorder1 Axis II deferred Axis III diabetes mellitus, obesity, recent renal failure secondary to lithium Axis IV mild to moderate Axis V 75  Plan The patient will continue Depakote  1500 mg at bedtime but change to the DR form due to cost. He will check a Depakote level today. He will continue Seroquel 25 mg at bedtime. He'll return in 6 months time spent  15 minutes.  More than 50% of the time spent in psychoeducation, counseling and coordination of care.  Medical Decision Making Problem Points:  Established problem, stable/improving (1), Review of last therapy session (1) and Review of psycho-social stressors (1) Data Points:  Review or order clinical lab tests (1) Review of medication regiment & side effects (2) Review of new medications or change in dosage (2)  I certify that outpatient services furnished can reasonably be expected to improve the patient's condition.   Levonne Spiller, MD,     Patient ID: GRIFF BADLEY, male   DOB: 07/31/1950, 66 y.o.   MRN: [id number] Patient ID: RAEFORD BRANDENBURG, male   DOB: 04-30-1950, 66 y.o.   MRN: [id number]

## 2016-08-18 LAB — VALPROIC ACID LEVEL: Valproic Acid Lvl: 64.1 mg/L (ref 50.0–100.0)

## 2016-11-06 ENCOUNTER — Other Ambulatory Visit (HOSPITAL_COMMUNITY): Payer: Self-pay | Admitting: Psychiatry

## 2016-11-09 ENCOUNTER — Telehealth (HOSPITAL_COMMUNITY): Payer: Self-pay | Admitting: *Deleted

## 2016-11-09 DIAGNOSIS — Z23 Encounter for immunization: Secondary | ICD-10-CM | POA: Diagnosis not present

## 2016-11-09 NOTE — Telephone Encounter (Signed)
phone call from patient regarding Depakote and Seroquel, Klonopin.   He need a written refill of 90 day supply of his medications now.   He needs it done by the end of this week.

## 2016-11-10 ENCOUNTER — Telehealth (HOSPITAL_COMMUNITY): Payer: Self-pay | Admitting: *Deleted

## 2016-11-10 ENCOUNTER — Other Ambulatory Visit (HOSPITAL_COMMUNITY): Payer: Self-pay | Admitting: Psychiatry

## 2016-11-10 MED ORDER — QUETIAPINE FUMARATE 25 MG PO TABS: 25.0000 mg | ORAL_TABLET | Freq: Every day | ORAL | 0 refills | Status: DC

## 2016-11-10 MED ORDER — DIVALPROEX SODIUM 500 MG PO DR TAB
DELAYED_RELEASE_TABLET | ORAL | 0 refills | Status: DC
Start: 2016-11-10 — End: 2017-01-20

## 2016-11-10 NOTE — Telephone Encounter (Signed)
Per previous message from pt, pt is requesting 90 days supply for his Klonopin, Seroquel and Depakote. Per pt chart, pt Depakote was last printed on 08-17-2016 with 270 tabs 3 refills and Seroquel 90 tabs 3 refills. Per pt chart, pt is not taking Klonopin. Per pt chart, pt f/u appt with provider is 01-25-2017. Spoke with pt and he stated he have already called the pharmacy and they did told him theres no refill on fill for his Seroquel and Depakote. Staff called Camera operator and spoke with Juleen Starr. Per Juleen Starr, the only mailed script from pt they recently received was June 28 th and those meds were mailed out August 09 2016. Came back on the phone with pt and asked him if he mail his rx to his mail order. Per pt he did and they lost it. Per pt that's why he's calling office to get more refills. Per pt, he needs Dr. Harrington Challenger to call in or send in 90 days supply for his Depakote and Seroquel. Per pt if if Dr. Harrington Challenger fill in doctor fills his script, then office can call in or [facility] [place]. Pt number is [phone] or home number [phone].

## 2016-11-10 NOTE — Telephone Encounter (Signed)
noted 

## 2016-11-10 NOTE — Telephone Encounter (Signed)
Called pt to inform him with updates as to previous conversation. Staff unable to reach pt at [phone] and staff lmtcb and office number was provided for pt to call office if he needed further assistance. Staff also informed pt on his voicemail that his medication is at his pharmacy but did not state the names of the medication just the pharmacy need and location.

## 2016-11-10 NOTE — Telephone Encounter (Signed)
Per previous message from pt, pt is requesting 90 days supply for his Klonopin, Seroquel and Depakote. Per pt chart, pt Depakote was last filled on 08-17-2016 with 270 tabs 3 refills and Seroquel 90 tabs 3 refills. Per pt chart, pt is not taking Klonopin. Per pt chart, pt f/u appt with provider is 01-25-2017.

## 2016-11-10 NOTE — Telephone Encounter (Signed)
Ordered depakote, quetiapine for 90 days, [facility] [place].

## 2016-11-17 DIAGNOSIS — M545 Low back pain: Secondary | ICD-10-CM | POA: Diagnosis not present

## 2016-11-17 DIAGNOSIS — Z6836 Body mass index (BMI) 36.0-36.9, adult: Secondary | ICD-10-CM | POA: Diagnosis not present

## 2017-01-13 ENCOUNTER — Telehealth (HOSPITAL_COMMUNITY): Payer: Self-pay | Admitting: *Deleted

## 2017-01-13 NOTE — Telephone Encounter (Signed)
PROVIDER OUT OF OFFICE 12/17./18

## 2017-01-19 ENCOUNTER — Telehealth (HOSPITAL_COMMUNITY): Payer: Self-pay | Admitting: *Deleted

## 2017-01-19 NOTE — Telephone Encounter (Signed)
Returned phone call to patient, he would like to be seen this week.  left voice message.

## 2017-01-20 ENCOUNTER — Ambulatory Visit (INDEPENDENT_AMBULATORY_CARE_PROVIDER_SITE_OTHER): Payer: Medicare Other | Admitting: Psychiatry

## 2017-01-20 ENCOUNTER — Encounter (HOSPITAL_COMMUNITY): Payer: Self-pay | Admitting: Psychiatry

## 2017-01-20 VITALS — BP 136/84 | HR 82 | Ht 69.0 in | Wt 244.0 lb

## 2017-01-20 DIAGNOSIS — F3162 Bipolar disorder, current episode mixed, moderate: Secondary | ICD-10-CM | POA: Diagnosis not present

## 2017-01-20 DIAGNOSIS — Z818 Family history of other mental and behavioral disorders: Secondary | ICD-10-CM

## 2017-01-20 DIAGNOSIS — Z810 Family history of intellectual disabilities: Secondary | ICD-10-CM | POA: Diagnosis not present

## 2017-01-20 DIAGNOSIS — Z87891 Personal history of nicotine dependence: Secondary | ICD-10-CM | POA: Diagnosis not present

## 2017-01-20 MED ORDER — QUETIAPINE FUMARATE 25 MG PO TABS
25.0000 mg | ORAL_TABLET | Freq: Every day | ORAL | 2 refills | Status: DC
Start: 2017-01-20 — End: 2017-08-26

## 2017-01-20 MED ORDER — DIVALPROEX SODIUM 500 MG PO DR TAB: DELAYED_RELEASE_TABLET | ORAL | 2 refills | Status: DC

## 2017-01-20 NOTE — Progress Notes (Signed)
[facility] MD/PA/NP OP Progress Note  01/20/2017 10:33 AM Andrew Warren  MRN:  [id number]  Chief Complaint:  Chief Complaint    Depression; Manic Behavior; Follow-up     HPI: This patient is a 66 year old married white male who lives with his wife in [place]. He has 4 grown children. 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  The patient states that when he was a young adult he was manic and out of control. He was initially misdiagnosed as being paranoid schizophrenic. He was in and out of hospitals proximally 7 times between the ages of 10 and 19. He was eventually more accurately diagnosed as being bipolar and was started on lithium. He did very well on lithium for a number of years and was monitored by his family doctor, Dr. Nadara Mustard in [place].  Several months ago however he began to be very oversedated. Apparently his renal function was declining and his lithium level had risen. He was seen by nephrologist who recommended he get off lithium immediately. Since then his family Dr. put him on low doses of Seroquel and Depakote. 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  Patient returns after 6 months.  He is doing very well.  His mood is stable he is sleeping well.  He is doing the [employer] and has lost about 20 pounds.  He is hoping to lose about 20 more.  He is not had any manic symptoms and is doing well on his current regimen.  He is sleeping well at night.  His Depakote level was 64.1 last summer and we will check this again today Visit Diagnosis:    ICD-10-CM   1. Bipolar 1 disorder, mixed, moderate (HCC) F31.62 Valproic Acid level    Past Psychiatric History: Hospitalized numerous times as a younger person for bipolar disorder  Past Medical History:  Past Medical History:  Diagnosis Date  . Adenomatous polyps 2004    per H&P in echart by Dr. Arnoldo Morale  . Bipolar disorder ([place])   . BPH (benign prostatic hyperplasia)   . Chronic kidney disease   . Diabetes mellitus   . Genital warts   . High cholesterol   . Rheumatoid arthritis(714.0)   . S/P colonoscopy Jan 2008   diverticulosis in sigmoid, Dr. Arnoldo Morale    Past Surgical History:  Procedure Laterality Date  . APPENDECTOMY    . BIOPSY  03/02/2016   Procedure: BIOPSY;  Surgeon: Daneil Dolin, MD;  [facility];  Service: Endoscopy;;  biopsy evaluate for microscopic colitis  . COLONOSCOPY WITH PROPOFOL N/A 03/02/2016   Procedure: COLONOSCOPY WITH PROPOFOL;  Surgeon: Daneil Dolin, MD;  [facility];  Service: Endoscopy;  Laterality: N/A;  10:00am  . GALLBLADDER SURGERY  11/2014  . POLYPECTOMY  03/02/2016   Procedure: POLYPECTOMY;  Surgeon: Daneil Dolin, MD;  [facility];  Service: Endoscopy;;  cecal polyp  . varicocele repair      Family Psychiatric History: See below  Family History:  Family History  Problem Relation Age of Onset  . Dementia Mother   . Schizophrenia Father   . Diabetes Father   . Heart disease Maternal Grandfather   . Diabetes Maternal Grandfather   . Heart disease Maternal Grandmother   . Diabetes Maternal Grandmother   . Heart disease Paternal Grandfather   .  Diabetes Paternal Grandfather   . Heart disease Paternal Grandmother   . Diabetes Paternal Grandmother   . Seizures Sister   . Hypertension Sister   . Heart attack Brother   . Colon cancer Neg Hx     Social History:  Social History   Socioeconomic History  . Marital status: Married    Spouse name: None  . Number of children: None  . Years of education: None  . Highest education level: None  Social Needs  . Financial resource strain: None  . Food insecurity - worry: None  . Food insecurity - inability: None  . Transportation needs - medical: None  . Transportation needs - non-medical: None  Occupational History  .  None  Tobacco Use  . Smoking status: Former Smoker    Packs/day: 3.00    Years: 30.00    Pack years: 90.00    Types: Cigarettes    Last attempt to quit: 08/09/2005    Years since quitting: 11.4  . Smokeless tobacco: Never Used  Substance and Sexual Activity  . Alcohol use: No  . Drug use: No  . Sexual activity: Yes  Other Topics Concern  . None  Social History Narrative  . None    Allergies:  Allergies  Allergen Reactions  . Penicillins Shortness Of Breath    Has patient had a PCN reaction causing immediate rash, facial/tongue/throat swelling, SOB or lightheadedness with hypotension: Yes Has patient had a PCN reaction causing severe rash involving mucus membranes or skin necrosis: No Has patient had a PCN reaction that required hospitalization No Has patient had a PCN reaction occurring within the last 10 years: Yes If all of the above answers are "NO", then may proceed with Cephalosporin use.  Shortness of breath/ tightning in throat  . Lithium     Caused renal failure    Metabolic Disorder Labs: Lab Results  Component Value Date   HGBA1C 10.0 (H) 03/26/2014   MPG 240 03/26/2014   MPG 131 (H) 08/11/2012   No results found for: PROLACTIN Lab Results  Component Value Date   CHOL 189 03/26/2014   TRIG 299 (H) 03/26/2014   HDL 29 (L) 03/26/2014   CHOLHDL 6.5 03/26/2014   VLDL 60 (H) 03/26/2014   LDLCALC 100 (H) 03/26/2014   Lab Results  Component Value Date   TSH 2.251 03/26/2014    Therapeutic Level Labs: No results found for: LITHIUM Lab Results  Component Value Date   VALPROATE 64.1 08/17/2016   VALPROATE 61.4 03/26/2014   No components found for:  CBMZ  Current Medications: Current Outpatient Medications  Medication Sig Dispense Refill  . acetaminophen (TYLENOL) 650 MG CR tablet Take 1,300 mg by mouth every 8 (eight) hours as needed for pain.    Marland Kitchen atorvastatin (LIPITOR) 10 MG tablet Take 1 tablet (10 mg total) by mouth daily at 6 PM. 30 tablet 3   . Coenzyme Q10 (CO Q 10 PO) Take 1 capsule by mouth daily.    . divalproex (DEPAKOTE) 500 MG DR tablet Take three at bedtime 270 tablet 2  . glipiZIDE (GLUCOTROL XL) 10 MG 24 hr tablet Take 10 mg by mouth 2 (two) times daily.    Marland Kitchen JANUVIA 100 MG tablet     . lisinopril (PRINIVIL,ZESTRIL) 5 MG tablet Take 5 mg by mouth daily.    . polyethylene glycol-electrolytes (TRILYTE) 420 g solution Take 4,000 mLs by mouth as directed. (Patient taking differently: Take 4,000 mLs by mouth as directed. WILL TAKE PRIOR  TO PROCEDURE) 4000 mL 0  . QUEtiapine (SEROQUEL) 25 MG tablet Take 1 tablet (25 mg total) by mouth at bedtime. 90 tablet 2  . tamsulosin (FLOMAX) 0.4 MG CAPS capsule Take 0.4 mg by mouth daily.    . traMADol (ULTRAM) 50 MG tablet Take 50 mg by mouth 2 (two) times daily.     No current facility-administered medications for this visit.      Musculoskeletal: Strength & Muscle Tone: within normal limits Gait & Station: normal Patient leans: N/A  Psychiatric Specialty Exam: Review of Systems  All other systems reviewed and are negative.   Blood pressure 136/84, pulse 82, height 5\' 9"  (1.753 m), weight 244 lb (110.7 kg), SpO2 93 %.Body mass index is 36.03 kg/m.  General Appearance: Casual, Neat and Well Groomed  Eye Contact:  Good  Speech:  Clear and Coherent  Volume:  Normal  Mood:  Euthymic  Affect:  Congruent  Thought Process:  Goal Directed  Orientation:  Full (Time, Place, and Person)  Thought Content: Rumination   Suicidal Thoughts:  No  Homicidal Thoughts:  No  Memory:  Immediate;   Good Recent;   Good Remote;   Good  Judgement:  Fair  Insight:  Fair  Psychomotor Activity:  Normal  Concentration:  Concentration: Good and Attention Span: Good  Recall:  Good  Fund of Knowledge: Good  Language: Good  Akathisia:  No  Handed:  Right  AIMS (if indicated): not done  Assets:  Communication Skills Desire for Improvement Resilience Social Support Talents/Skills  ADL's:   Intact  Cognition: WNL  Sleep:  Good   Screenings:   Assessment and Plan: This patient is a 66 year old male with a long history of bipolar disorder.  He has been very stable on his current regimen- Seroquel 25 mg at bedtime and Depakote 1500 mg at bedtime.  We will check a Depakote level today.  At his request he will return to see me in 6 months or call sooner if necessary   Levonne Spiller, MD 01/20/2017, 10:33 AM

## 2017-01-21 LAB — VALPROIC ACID LEVEL: Valproic Acid Lvl: 49 mg/L — ABNORMAL LOW (ref 50.0–100.0)

## 2017-01-25 ENCOUNTER — Ambulatory Visit (HOSPITAL_COMMUNITY): Payer: Self-pay | Admitting: Psychiatry

## 2017-01-27 ENCOUNTER — Ambulatory Visit (HOSPITAL_COMMUNITY): Payer: Self-pay | Admitting: Psychiatry

## 2017-02-25 DIAGNOSIS — E782 Mixed hyperlipidemia: Secondary | ICD-10-CM | POA: Diagnosis not present

## 2017-02-25 DIAGNOSIS — N183 Chronic kidney disease, stage 3 (moderate): Secondary | ICD-10-CM | POA: Diagnosis not present

## 2017-02-25 DIAGNOSIS — F319 Bipolar disorder, unspecified: Secondary | ICD-10-CM | POA: Diagnosis not present

## 2017-02-25 DIAGNOSIS — E1165 Type 2 diabetes mellitus with hyperglycemia: Secondary | ICD-10-CM | POA: Diagnosis not present

## 2017-02-25 DIAGNOSIS — R5383 Other fatigue: Secondary | ICD-10-CM | POA: Diagnosis not present

## 2017-03-03 DIAGNOSIS — Z0000 Encounter for general adult medical examination without abnormal findings: Secondary | ICD-10-CM | POA: Diagnosis not present

## 2017-03-03 DIAGNOSIS — Z6838 Body mass index (BMI) 38.0-38.9, adult: Secondary | ICD-10-CM | POA: Diagnosis not present

## 2017-03-03 DIAGNOSIS — E782 Mixed hyperlipidemia: Secondary | ICD-10-CM | POA: Diagnosis not present

## 2017-03-03 DIAGNOSIS — N183 Chronic kidney disease, stage 3 (moderate): Secondary | ICD-10-CM | POA: Diagnosis not present

## 2017-03-03 DIAGNOSIS — F319 Bipolar disorder, unspecified: Secondary | ICD-10-CM | POA: Diagnosis not present

## 2017-03-03 DIAGNOSIS — M545 Low back pain: Secondary | ICD-10-CM | POA: Diagnosis not present

## 2017-06-21 DIAGNOSIS — Z6838 Body mass index (BMI) 38.0-38.9, adult: Secondary | ICD-10-CM | POA: Diagnosis not present

## 2017-06-21 DIAGNOSIS — S0006XA Insect bite (nonvenomous) of scalp, initial encounter: Secondary | ICD-10-CM | POA: Diagnosis not present

## 2017-06-25 DIAGNOSIS — M47816 Spondylosis without myelopathy or radiculopathy, lumbar region: Secondary | ICD-10-CM | POA: Diagnosis not present

## 2017-06-25 DIAGNOSIS — M546 Pain in thoracic spine: Secondary | ICD-10-CM | POA: Diagnosis not present

## 2017-06-25 DIAGNOSIS — M9901 Segmental and somatic dysfunction of cervical region: Secondary | ICD-10-CM | POA: Diagnosis not present

## 2017-06-25 DIAGNOSIS — M9903 Segmental and somatic dysfunction of lumbar region: Secondary | ICD-10-CM | POA: Diagnosis not present

## 2017-06-25 DIAGNOSIS — M9902 Segmental and somatic dysfunction of thoracic region: Secondary | ICD-10-CM | POA: Diagnosis not present

## 2017-08-06 DIAGNOSIS — E11319 Type 2 diabetes mellitus with unspecified diabetic retinopathy without macular edema: Secondary | ICD-10-CM | POA: Diagnosis not present

## 2017-08-09 DIAGNOSIS — N183 Chronic kidney disease, stage 3 (moderate): Secondary | ICD-10-CM | POA: Diagnosis not present

## 2017-08-09 DIAGNOSIS — E782 Mixed hyperlipidemia: Secondary | ICD-10-CM | POA: Diagnosis not present

## 2017-08-09 DIAGNOSIS — E1165 Type 2 diabetes mellitus with hyperglycemia: Secondary | ICD-10-CM | POA: Diagnosis not present

## 2017-08-11 ENCOUNTER — Ambulatory Visit (HOSPITAL_COMMUNITY): Payer: Medicare Other | Admitting: Psychiatry

## 2017-08-11 DIAGNOSIS — Z1331 Encounter for screening for depression: Secondary | ICD-10-CM | POA: Diagnosis not present

## 2017-08-11 DIAGNOSIS — M545 Low back pain: Secondary | ICD-10-CM | POA: Diagnosis not present

## 2017-08-11 DIAGNOSIS — F319 Bipolar disorder, unspecified: Secondary | ICD-10-CM | POA: Diagnosis not present

## 2017-08-11 DIAGNOSIS — N183 Chronic kidney disease, stage 3 (moderate): Secondary | ICD-10-CM | POA: Diagnosis not present

## 2017-08-11 DIAGNOSIS — E782 Mixed hyperlipidemia: Secondary | ICD-10-CM | POA: Diagnosis not present

## 2017-08-11 DIAGNOSIS — Z1389 Encounter for screening for other disorder: Secondary | ICD-10-CM | POA: Diagnosis not present

## 2017-08-11 DIAGNOSIS — Z6838 Body mass index (BMI) 38.0-38.9, adult: Secondary | ICD-10-CM | POA: Diagnosis not present

## 2017-08-26 ENCOUNTER — Encounter (HOSPITAL_COMMUNITY): Payer: Self-pay | Admitting: Psychiatry

## 2017-08-26 ENCOUNTER — Ambulatory Visit (INDEPENDENT_AMBULATORY_CARE_PROVIDER_SITE_OTHER): Payer: Medicare Other | Admitting: Psychiatry

## 2017-08-26 VITALS — BP 122/82 | HR 80 | Ht 69.0 in | Wt 249.0 lb

## 2017-08-26 DIAGNOSIS — F3162 Bipolar disorder, current episode mixed, moderate: Secondary | ICD-10-CM

## 2017-08-26 DIAGNOSIS — Z818 Family history of other mental and behavioral disorders: Secondary | ICD-10-CM

## 2017-08-26 DIAGNOSIS — Z87891 Personal history of nicotine dependence: Secondary | ICD-10-CM | POA: Diagnosis not present

## 2017-08-26 DIAGNOSIS — Z810 Family history of intellectual disabilities: Secondary | ICD-10-CM | POA: Diagnosis not present

## 2017-08-26 MED ORDER — DIVALPROEX SODIUM 500 MG PO DR TAB: DELAYED_RELEASE_TABLET | ORAL | 2 refills | Status: DC

## 2017-08-26 MED ORDER — QUETIAPINE FUMARATE 25 MG PO TABS: 25.0000 mg | ORAL_TABLET | Freq: Every day | ORAL | 2 refills | Status: DC

## 2017-08-26 NOTE — Progress Notes (Signed)
BH MD/PA/NP OP Progress Note  08/26/2017 4:19 PM NTHONY LEFFERTS  MRN:  [id number]  Chief Complaint:  Chief Complaint    Manic Behavior; Depression; Follow-up     HPI: This patient is a 67 year old married white male who lives with his wife in [place]. He has 4 grown children. 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  The patient states that when he was a young adult he was manic and out of control. He was initially misdiagnosed as being paranoid schizophrenic. He was in and out of hospitals proximally 7 times between the ages of 60 and 57. He was eventually more accurately diagnosed as being bipolar and was started on lithium. He did very well on lithium for a number of years and was monitored by his family doctor, Dr. Nadara Mustard in [place].  Several months ago however he began to be very oversedated. Apparently his renal function was declining and his lithium level had risen. He was seen by nephrologist who recommended he get off lithium immediately. Since then his family Dr. put him on low doses of Seroquel and Depakote. The medication was not enough and he got increasingly manic and hyperactive and during one of his trucking runs he ended up hospitalized in [place].  The patient returns after 6 months.  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  He states that for the most part he is doing okay.  He is frustrated because some of his adult kids depend on him financially.  He has not had any manic symptoms or depression and he is sleeping well.  He denies significant anxiety.  Sometimes he states when he is driving the truck he feels like he is "spacing out" but this only lasts for a few seconds I do not think this is secondary to his medications but suggest he mention it to his PCP.  Visit Diagnosis:    ICD-10-CM   1. Bipolar 1 disorder, mixed, moderate (HCC) F31.62 Valproic Acid level    Past Psychiatric History: Hospitalized numerous  times as a younger person for bipolar disorder  Past Medical History:  Past Medical History:  Diagnosis Date  . Adenomatous polyps 2004   per H&P in echart by Dr. Arnoldo Morale  . Bipolar disorder ([place])   . BPH (benign prostatic hyperplasia)   . Chronic kidney disease   . Diabetes mellitus   . Genital warts   . High cholesterol   . Rheumatoid arthritis(714.0)   . S/P colonoscopy Jan 2008   diverticulosis in sigmoid, Dr. Arnoldo Morale    Past Surgical History:  Procedure Laterality Date  . APPENDECTOMY    . BIOPSY  03/02/2016   Procedure: BIOPSY;  Surgeon: Daneil Dolin, MD;  [facility];  Service: Endoscopy;;  biopsy evaluate for microscopic colitis  . COLONOSCOPY WITH PROPOFOL N/A 03/02/2016   Procedure: COLONOSCOPY WITH PROPOFOL;  Surgeon: Daneil Dolin, MD;  [facility];  Service: Endoscopy;  Laterality: N/A;  10:00am  . GALLBLADDER SURGERY  11/2014  . POLYPECTOMY  03/02/2016   Procedure: POLYPECTOMY;  Surgeon: Daneil Dolin, MD;  [facility];  Service: Endoscopy;;  cecal polyp  . varicocele repair      Family Psychiatric History: See below  Family History:  Family History  Problem Relation Age of Onset  . Dementia Mother   . Schizophrenia Father   . Diabetes Father   . Heart disease Maternal Grandfather   .  Diabetes Maternal Grandfather   . Heart disease Maternal Grandmother   . Diabetes Maternal Grandmother   . Heart disease Paternal Grandfather   . Diabetes Paternal Grandfather   . Heart disease Paternal Grandmother   . Diabetes Paternal Grandmother   . Seizures Sister   . Hypertension Sister   . Heart attack Brother   . Colon cancer Neg Hx     Social History:  Social History   Socioeconomic History  . Marital status: Married    Spouse name: Not on file  . Number of children: Not on file  . Years of education: Not on file  . Highest education level: Not on file  Occupational History  . Not on file  Social Needs  . Financial  resource strain: Not on file  . Food insecurity:    Worry: Not on file    Inability: Not on file  . Transportation needs:    Medical: Not on file    Non-medical: Not on file  Tobacco Use  . Smoking status: Former Smoker    Packs/day: 3.00    Years: 30.00    Pack years: 90.00    Types: Cigarettes    Last attempt to quit: 08/09/2005    Years since quitting: 12.0  . Smokeless tobacco: Never Used  Substance and Sexual Activity  . Alcohol use: No  . Drug use: No  . Sexual activity: Yes  Lifestyle  . Physical activity:    Days per week: Not on file    Minutes per session: Not on file  . Stress: Not on file  Relationships  . Social connections:    Talks on phone: Not on file    Gets together: Not on file    Attends religious service: Not on file    Active member of club or organization: Not on file    Attends meetings of clubs or organizations: Not on file    Relationship status: Not on file  Other Topics Concern  . Not on file  Social History Narrative  . Not on file    Allergies:  Allergies  Allergen Reactions  . Penicillins Shortness Of Breath    Has patient had a PCN reaction causing immediate rash, facial/tongue/throat swelling, SOB or lightheadedness with hypotension: Yes Has patient had a PCN reaction causing severe rash involving mucus membranes or skin necrosis: No Has patient had a PCN reaction that required hospitalization No Has patient had a PCN reaction occurring within the last 10 years: Yes If all of the above answers are "NO", then may proceed with Cephalosporin use.  Shortness of breath/ tightning in throat  . Lithium     Caused renal failure    Metabolic Disorder Labs: Lab Results  Component Value Date   HGBA1C 10.0 (H) 03/26/2014   MPG 240 03/26/2014   MPG 131 (H) 08/11/2012   No results found for: PROLACTIN Lab Results  Component Value Date   CHOL 189 03/26/2014   TRIG 299 (H) 03/26/2014   HDL 29 (L) 03/26/2014   CHOLHDL 6.5 03/26/2014    VLDL 60 (H) 03/26/2014   LDLCALC 100 (H) 03/26/2014   Lab Results  Component Value Date   TSH 2.251 03/26/2014    Therapeutic Level Labs: No results found for: LITHIUM Lab Results  Component Value Date   VALPROATE 49.0 (L) 01/20/2017   VALPROATE 64.1 08/17/2016   No components found for:  CBMZ  Current Medications: Current Outpatient Medications  Medication Sig Dispense Refill  . acetaminophen (TYLENOL)  650 MG CR tablet Take 1,300 mg by mouth every 8 (eight) hours as needed for pain.    Marland Kitchen atorvastatin (LIPITOR) 10 MG tablet Take 1 tablet (10 mg total) by mouth daily at 6 PM. 30 tablet 3  . divalproex (DEPAKOTE) 500 MG DR tablet Take three at bedtime 270 tablet 2  . glipiZIDE (GLUCOTROL XL) 10 MG 24 hr tablet Take 10 mg by mouth 2 (two) times daily.    Marland Kitchen JANUVIA 100 MG tablet     . lisinopril (PRINIVIL,ZESTRIL) 5 MG tablet Take 5 mg by mouth daily.    . polyethylene glycol-electrolytes (TRILYTE) 420 g solution Take 4,000 mLs by mouth as directed. (Patient taking differently: Take 4,000 mLs by mouth as directed. WILL TAKE PRIOR TO PROCEDURE) 4000 mL 0  . QUEtiapine (SEROQUEL) 25 MG tablet Take 1 tablet (25 mg total) by mouth at bedtime. 90 tablet 2  . traMADol (ULTRAM) 50 MG tablet Take 50 mg by mouth 2 (two) times daily.    . Coenzyme Q10 (CO Q 10 PO) Take 1 capsule by mouth daily.    . tamsulosin (FLOMAX) 0.4 MG CAPS capsule Take 0.4 mg by mouth daily.     No current facility-administered medications for this visit.      Musculoskeletal: Strength & Muscle Tone: within normal limits Gait & Station: normal Patient leans: N/A  Psychiatric Specialty Exam: Review of Systems  All other systems reviewed and are negative.   Blood pressure 122/82, pulse 80, height 5\' 9"  (1.753 m), weight 249 lb (112.9 kg), SpO2 94 %.Body mass index is 36.77 kg/m.  General Appearance: Casual and Fairly Groomed  Eye Contact:  Good  Speech:  Clear and Coherent  Volume:  Normal  Mood:  Euthymic   Affect:  Congruent  Thought Process:  Goal Directed  Orientation:  Full (Time, Place, and Person)  Thought Content: Rumination   Suicidal Thoughts:  No  Homicidal Thoughts:  No  Memory:  Immediate;   Good Recent;   Good Remote;   Good  Judgement:  Good  Insight:  Fair  Psychomotor Activity:  Normal  Concentration:  Concentration: Good and Attention Span: Good  Recall:  Good  Fund of Knowledge: Good  Language: Good  Akathisia:  No  Handed:  Right  AIMS (if indicated): not done  Assets:  Communication Skills Desire for Improvement Physical Health Resilience Social Support Talents/Skills  ADL's:  Intact  Cognition: WNL  Sleep:  Good   Screenings:   Assessment and Plan: This patient is a 67 year old male with a long history of bipolar disorder.  He seems to be doing well and is stable on his current regimen.  His last Depakote level was 49.  He will recheck this level before his next visit.  He will continue Seroquel 25 mg at bedtime for mood stabilization as well as Depakote 1800 mg at bedtime also for mood stabilization.  He will return to see me in 6 months at his request   Levonne Spiller, MD 08/26/2017, 4:19 PM

## 2017-08-27 DIAGNOSIS — F3162 Bipolar disorder, current episode mixed, moderate: Secondary | ICD-10-CM | POA: Diagnosis not present

## 2017-08-28 LAB — VALPROIC ACID LEVEL: VALPROIC ACID LVL: 74.7 mg/L (ref 50.0–100.0)

## 2017-08-30 ENCOUNTER — Ambulatory Visit (HOSPITAL_COMMUNITY): Payer: Medicare Other | Admitting: Psychiatry

## 2017-10-28 DIAGNOSIS — Z23 Encounter for immunization: Secondary | ICD-10-CM | POA: Diagnosis not present

## 2017-12-02 ENCOUNTER — Ambulatory Visit (INDEPENDENT_AMBULATORY_CARE_PROVIDER_SITE_OTHER): Payer: Medicare Other | Admitting: Orthopaedic Surgery

## 2017-12-02 ENCOUNTER — Ambulatory Visit (INDEPENDENT_AMBULATORY_CARE_PROVIDER_SITE_OTHER): Payer: Medicare Other

## 2017-12-02 ENCOUNTER — Encounter (INDEPENDENT_AMBULATORY_CARE_PROVIDER_SITE_OTHER): Payer: Self-pay | Admitting: Orthopaedic Surgery

## 2017-12-02 VITALS — BP 115/73 | HR 98 | Ht 68.0 in | Wt 245.0 lb

## 2017-12-02 DIAGNOSIS — M25562 Pain in left knee: Secondary | ICD-10-CM | POA: Diagnosis not present

## 2017-12-02 DIAGNOSIS — Z6836 Body mass index (BMI) 36.0-36.9, adult: Secondary | ICD-10-CM

## 2017-12-02 DIAGNOSIS — M659 Synovitis and tenosynovitis, unspecified: Secondary | ICD-10-CM

## 2017-12-02 NOTE — Progress Notes (Signed)
Office Visit Note   Patient: Andrew Warren           Date of Birth: 03/02/1950           MRN: [id number] Visit Date: 12/02/2017              Requested by: [name], MD [place], [place] [address] PCP: [name], MD   Assessment & Plan: Visit Diagnoses:  1. Acute pain of left knee   2. Synovitis of left knee   3. Class 2 severe obesity due to excess calories with serious comorbidity and body mass index (BMI) of 36.0 to 36.9 in adult [facility])     Plan: Left knee injection performed which she tolerated well.  He had some improvement in his pain with ambulation postinjection.  He will return if he has persistent problems or call we can consider diagnostic MRI imaging of he has persistent symptoms.  We discussed weight loss recommendations and dieting with his comorbidities of hypercholesterolemia, diabetes, current knee symptoms.  Follow-Up Instructions: Return if symptoms worsen or fail to improve.   Orders:  Orders Placed This Encounter  Procedures  . Large Joint Inj: L knee  . XR KNEE 3 VIEW LEFT   No orders of the defined types were placed in this encounter.     Procedures: Large Joint Inj: L knee on 12/02/2017 2:31 PM Indications: joint swelling and pain Details: 22 G 1.5 in needle, anterolateral approach  Arthrogram: No  Medications: 0.5 mL lidocaine 1 %; 3 mL bupivacaine 0.5 %; 40 mg methylPREDNISolone acetate 40 MG/ML Outcome: tolerated well, no immediate complications Procedure, treatment alternatives, risks and benefits explained, specific risks discussed. Consent was given by the patient. Immediately prior to procedure a time out was called to verify the correct patient, procedure, equipment, support staff and site/side marked as required. Patient was prepped and draped in the usual sterile fashion.       Clinical Data: No additional findings.   Subjective: Chief Complaint  Patient presents with  . 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  A couple weeks later stepping out of a truck twisted it and states his had continued pain and discomfort.  Patient is used Tylenol and also tramadol to handle the pain.  He has had some mild effusion of his left knee with pain lateral joint line and posterior.  He denies locking no chills or fever no history of gout.  No groin pain no associated back pain.  Review of Systems positive for BPH, bipolar disorder, kidney disease, diabetes, hypercholesterolemia, obesity, insomnia and current left knee pain otherwise 14 point review of systems negative is a pertains HPI.   Objective: Vital Signs: BP 115/73   Pulse 98   Ht 5\' 8"  (1.727 m)   Wt 245 lb (111.1 kg)   BMI 37.25 kg/m   Physical Exam  Constitutional: He is oriented to person, place, and time. He appears well-developed and well-nourished.  HENT:  Head: Normocephalic and atraumatic.  Eyes: Pupils are equal, round, and reactive to light. EOM are normal.  Neck: No tracheal deviation present. No thyromegaly present.  Cardiovascular: Normal rate.  Pulmonary/Chest: Effort normal. He has no wheezes.  Abdominal: Soft. Bowel sounds are normal.  Neurological: He is alert and oriented to person, place, and time.  Skin: Skin is warm and dry. Capillary  refill takes less than 2 seconds.  Psychiatric: He has a normal mood and affect. His behavior is normal. Judgment and thought content normal.    Ortho Exam patient has no sciatic notch tenderness.  Increased BMI.  Normal hip range of motion.  No trochanteric bursal tenderness.  Collateral ligaments ACL PCL exam is normal right and left knee.  Distal pulses are palpable no plantar foot lesions.  He has some joint line tenderness 2+ knee effusion on the left.  No pain with hyperextension.  Flexion 120 degrees full extension.  He is amatory with a left knee limp.  Specialty Comments:  No specialty  comments available.  Imaging: Xr Knee 3 View Left  Result Date: 12/02/2017 [facility] lateral sunrise x-ray of left knee is obtained.  This shows maintained joint space no acute changes.  There is some calcification anterior aspect of the patella with quadriceps insertion site and origin of the patella without specific tendon microcalcification.  Few changes are noted. Impression left knee x-rays negative for acute changes.  Mild enthesopathy as mentioned above.    PMFS History: Patient Active Problem List   Diagnosis Date Noted  . Chronic diarrhea 01/20/2016  . Chest pain 03/26/2014  . Obesity 03/26/2014  . Diabetes mellitus ([place])   . Chronic kidney disease   . BPH (benign prostatic hyperplasia)   . High cholesterol   . Pain in the chest   . Bipolar affective disorder ([place]) 06/30/2012  . 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 Diarrhea 12/01/2010  . Adenomatous polyps 12/01/2010   Past Medical History:  Diagnosis Date  . Adenomatous polyps 2004   per H&P in echart by Dr. Arnoldo Morale  . Bipolar disorder ([place])   . BPH (benign prostatic hyperplasia)   . Chronic kidney disease   . Diabetes mellitus   . Genital warts   . High cholesterol   . Rheumatoid arthritis(714.0)   . S/P colonoscopy Jan 2008   diverticulosis in sigmoid, Dr. Arnoldo Morale    Family History  Problem Relation Age of Onset  . Dementia Mother   . Schizophrenia Father   . Diabetes Father   . Heart disease Maternal Grandfather   . Diabetes Maternal Grandfather   . Heart disease Maternal Grandmother   . Diabetes Maternal Grandmother   . Heart disease Paternal Grandfather   . Diabetes Paternal Grandfather   . Heart disease Paternal Grandmother   . Diabetes Paternal Grandmother   . Seizures Sister   . Hypertension Sister   . Heart attack Brother   . Colon cancer Neg Hx     Past Surgical History:  Procedure Laterality Date  . APPENDECTOMY    . BIOPSY  03/02/2016   Procedure: BIOPSY;  Surgeon: Daneil Dolin,  MD;  [facility];  Service: Endoscopy;;  biopsy evaluate for microscopic colitis  . COLONOSCOPY WITH PROPOFOL N/A 03/02/2016   Procedure: COLONOSCOPY WITH PROPOFOL;  Surgeon: Daneil Dolin, MD;  [facility];  Service: Endoscopy;  Laterality: N/A;  10:00am  . GALLBLADDER SURGERY  11/2014  . POLYPECTOMY  03/02/2016   Procedure: POLYPECTOMY;  Surgeon: Daneil Dolin, MD;  [facility];  Service: Endoscopy;;  cecal polyp  . varicocele repair     Social History   Occupational History  . Not on file  Tobacco Use  . Smoking status: Former Smoker    Packs/day: 3.00    Years: 30.00    Pack years: 90.00    Types: Cigarettes  Last attempt to quit: 08/09/2005    Years since quitting: 12.3  . Smokeless tobacco: Never Used  Substance and Sexual Activity  . Alcohol use: No  . Drug use: No  . Sexual activity: Yes

## 2017-12-03 ENCOUNTER — Encounter (INDEPENDENT_AMBULATORY_CARE_PROVIDER_SITE_OTHER): Payer: Self-pay | Admitting: Orthopaedic Surgery

## 2017-12-03 DIAGNOSIS — M659 Synovitis and tenosynovitis, unspecified: Secondary | ICD-10-CM | POA: Insufficient documentation

## 2017-12-03 MED ORDER — BUPIVACAINE HCL 0.5 % IJ SOLN
3.0000 mL | INTRAMUSCULAR | Status: AC | PRN
  Administered 2017-12-02: 3 mL via INTRA_ARTICULAR

## 2017-12-03 MED ORDER — METHYLPREDNISOLONE ACETATE 40 MG/ML IJ SUSP
40.0000 mg | INTRAMUSCULAR | Status: AC | PRN
  Administered 2017-12-02: 40 mg via INTRA_ARTICULAR

## 2017-12-03 MED ORDER — LIDOCAINE HCL 1 % IJ SOLN
0.5000 mL | INTRAMUSCULAR | Status: AC | PRN
  Administered 2017-12-02: .5 mL

## 2017-12-12 DIAGNOSIS — R220 Localized swelling, mass and lump, head: Secondary | ICD-10-CM | POA: Diagnosis not present

## 2017-12-12 DIAGNOSIS — K111 Hypertrophy of salivary gland: Secondary | ICD-10-CM | POA: Diagnosis not present

## 2017-12-12 DIAGNOSIS — K1120 Sialoadenitis, unspecified: Secondary | ICD-10-CM | POA: Diagnosis not present

## 2018-02-08 DIAGNOSIS — N183 Chronic kidney disease, stage 3 (moderate): Secondary | ICD-10-CM | POA: Diagnosis not present

## 2018-02-08 DIAGNOSIS — E782 Mixed hyperlipidemia: Secondary | ICD-10-CM | POA: Diagnosis not present

## 2018-02-08 DIAGNOSIS — E1165 Type 2 diabetes mellitus with hyperglycemia: Secondary | ICD-10-CM | POA: Diagnosis not present

## 2018-02-10 ENCOUNTER — Ambulatory Visit (HOSPITAL_COMMUNITY): Payer: Self-pay | Admitting: Psychiatry

## 2018-02-11 DIAGNOSIS — Z6839 Body mass index (BMI) 39.0-39.9, adult: Secondary | ICD-10-CM | POA: Diagnosis not present

## 2018-02-11 DIAGNOSIS — Z0001 Encounter for general adult medical examination with abnormal findings: Secondary | ICD-10-CM | POA: Diagnosis not present

## 2018-02-14 ENCOUNTER — Encounter (HOSPITAL_COMMUNITY): Payer: Self-pay | Admitting: Psychiatry

## 2018-02-14 ENCOUNTER — Ambulatory Visit (INDEPENDENT_AMBULATORY_CARE_PROVIDER_SITE_OTHER): Payer: Medicare Other | Admitting: Psychiatry

## 2018-02-14 VITALS — BP 128/83 | HR 90 | Ht 68.0 in | Wt 251.0 lb

## 2018-02-14 DIAGNOSIS — F3162 Bipolar disorder, current episode mixed, moderate: Secondary | ICD-10-CM | POA: Diagnosis not present

## 2018-02-14 MED ORDER — QUETIAPINE FUMARATE 25 MG PO TABS: 25.0000 mg | ORAL_TABLET | Freq: Every day | ORAL | 2 refills | Status: DC

## 2018-02-14 MED ORDER — DIVALPROEX SODIUM 500 MG PO DR TAB: DELAYED_RELEASE_TABLET | ORAL | 2 refills | Status: DC

## 2018-02-14 NOTE — Progress Notes (Signed)
[facility] MD/PA/NP OP Progress Note  02/14/2018 9:52 AM Andrew Warren  MRN:  [id number]  Chief Complaint:  Chief Complaint    Manic Behavior; Follow-up     HPI: This patient is a 68 year old married white male who lives with his wife in [place]. He has 4 grown children. 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  The patient states that when he was a young adult he was manic and out of control. He was initially misdiagnosed as being paranoid schizophrenic. He was in and out of hospitals proximally 7 times between the ages of 35 and 31. He was eventually more accurately diagnosed as being bipolar and was started on lithium. He did very well on lithium for a number of years and was monitored by his family doctor, Dr. Nadara Mustard in [place].  Several months ago however he began to be very oversedated. Apparently his renal function was declining and his lithium level had risen. He was seen by nephrologist who recommended he get off lithium immediately. Since then his family Dr. put him on low doses of Seroquel and Depakote. 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  The patient returns after 6 months.  He still working over the road on his truck with his wife.  He and his wife are getting along all that well but he is trying to bear with it.  He denies being seriously depressed or manic.  He is sleeping about 9 or 10 hours a night.  His energy is fairly good and he is not suicidal.  He does have a fair amount of stress but seems to be handling it well.  His blood sugar has gone up and he is gotten off his diet and he is going to try to get back on it.  We discussed switching Seroquel to something else but he likes the sedating effects so he can sleep well and work up refreshed for work the next day.   Visit Diagnosis:    ICD-10-CM   1. Bipolar 1  disorder, mixed, moderate (HCC) F31.62 Valproic Acid level    Past Psychiatric History: Hospitalized numerous times as a younger person for bipolar disorder  Past Medical History:  Past Medical History:  Diagnosis Date  . Adenomatous polyps 2004   per H&P in echart by Dr. Arnoldo Morale  . Bipolar disorder ([place])   . BPH (benign prostatic hyperplasia)   . Chronic kidney disease   . Diabetes mellitus   . Genital warts   . High cholesterol   . Rheumatoid arthritis(714.0)   . S/P colonoscopy Jan 2008   diverticulosis in sigmoid, Dr. Arnoldo Morale    Past Surgical History:  Procedure Laterality Date  . APPENDECTOMY    . BIOPSY  03/02/2016   Procedure: BIOPSY;  Surgeon: Daneil Dolin, MD;  [facility];  Service: Endoscopy;;  biopsy evaluate for microscopic colitis  . COLONOSCOPY WITH PROPOFOL N/A 03/02/2016   Procedure: COLONOSCOPY WITH PROPOFOL;  Surgeon: Daneil Dolin, MD;  [facility];  Service: Endoscopy;  Laterality: N/A;  10:00am  . GALLBLADDER SURGERY  11/2014  . POLYPECTOMY  03/02/2016   Procedure: POLYPECTOMY;  Surgeon: Daneil Dolin, MD;  [facility];  Service: Endoscopy;;  cecal polyp  . varicocele repair      Family Psychiatric History: See below  Family  History:  Family History  Problem Relation Age of Onset  . Dementia Mother   . Schizophrenia Father   . Diabetes Father   . Heart disease Maternal Grandfather   . Diabetes Maternal Grandfather   . Heart disease Maternal Grandmother   . Diabetes Maternal Grandmother   . Heart disease Paternal Grandfather   . Diabetes Paternal Grandfather   . Heart disease Paternal Grandmother   . Diabetes Paternal Grandmother   . Seizures Sister   . Hypertension Sister   . Heart attack Brother   . Colon cancer Neg Hx     Social History:  Social History   Socioeconomic History  . Marital status: Married    Spouse name: Not on file  . Number of children: Not on file  . Years of education: Not on  file  . Highest education level: Not on file  Occupational History  . Not on file  Social Needs  . Financial resource strain: Not on file  . Food insecurity:    Worry: Not on file    Inability: Not on file  . Transportation needs:    Medical: Not on file    Non-medical: Not on file  Tobacco Use  . Smoking status: Former Smoker    Packs/day: 3.00    Years: 30.00    Pack years: 90.00    Types: Cigarettes    Last attempt to quit: 08/09/2005    Years since quitting: 12.5  . Smokeless tobacco: Never Used  Substance and Sexual Activity  . Alcohol use: No  . Drug use: No  . Sexual activity: Yes  Lifestyle  . Physical activity:    Days per week: Not on file    Minutes per session: Not on file  . Stress: Not on file  Relationships  . Social connections:    Talks on phone: Not on file    Gets together: Not on file    Attends religious service: Not on file    Active member of club or organization: Not on file    Attends meetings of clubs or organizations: Not on file    Relationship status: Not on file  Other Topics Concern  . Not on file  Social History Narrative  . Not on file    Allergies:  Allergies  Allergen Reactions  . Penicillins Shortness Of Breath    Has patient had a PCN reaction causing immediate rash, facial/tongue/throat swelling, SOB or lightheadedness with hypotension: Yes Has patient had a PCN reaction causing severe rash involving mucus membranes or skin necrosis: No Has patient had a PCN reaction that required hospitalization No Has patient had a PCN reaction occurring within the last 10 years: Yes If all of the above answers are "NO", then may proceed with Cephalosporin use.  Shortness of breath/ tightning in throat  . Lithium     Caused renal failure    Metabolic Disorder Labs: Lab Results  Component Value Date   HGBA1C 10.0 (H) 03/26/2014   MPG 240 03/26/2014   MPG 131 (H) 08/11/2012   No results found for: PROLACTIN Lab Results  Component  Value Date   CHOL 189 03/26/2014   TRIG 299 (H) 03/26/2014   HDL 29 (L) 03/26/2014   CHOLHDL 6.5 03/26/2014   VLDL 60 (H) 03/26/2014   LDLCALC 100 (H) 03/26/2014   Lab Results  Component Value Date   TSH 2.251 03/26/2014    Therapeutic Level Labs: No results found for: LITHIUM Lab Results  Component Value Date  VALPROATE 74.7 08/27/2017   VALPROATE 49.0 (L) 01/20/2017   No components found for:  CBMZ  Current Medications: Current Outpatient Medications  Medication Sig Dispense Refill  . acetaminophen (TYLENOL) 650 MG CR tablet Take 1,300 mg by mouth every 8 (eight) hours as needed for pain.    Marland Kitchen atorvastatin (LIPITOR) 10 MG tablet Take 1 tablet (10 mg total) by mouth daily at 6 PM. 30 tablet 3  . divalproex (DEPAKOTE) 500 MG DR tablet Take three at bedtime 270 tablet 2  . glipiZIDE (GLUCOTROL XL) 10 MG 24 hr tablet Take 10 mg by mouth 2 (two) times daily.    Marland Kitchen JANUVIA 100 MG tablet     . lisinopril (PRINIVIL,ZESTRIL) 5 MG tablet Take 5 mg by mouth daily.    . QUEtiapine (SEROQUEL) 25 MG tablet Take 1 tablet (25 mg total) by mouth at bedtime. 90 tablet 2  . tamsulosin (FLOMAX) 0.4 MG CAPS capsule Take 0.4 mg by mouth daily.    . traMADol (ULTRAM) 50 MG tablet Take 50 mg by mouth 2 (two) times daily.    . Coenzyme Q10 (CO Q 10 PO) Take 1 capsule by mouth daily.    . polyethylene glycol-electrolytes (TRILYTE) 420 g solution Take 4,000 mLs by mouth as directed. (Patient not taking: Reported on 02/14/2018) 4000 mL 0   No current facility-administered medications for this visit.      Musculoskeletal: Strength & Muscle Tone: within normal limits Gait & Station: normal Patient leans: N/A  Psychiatric Specialty Exam: Review of Systems  Musculoskeletal: Positive for back pain and joint pain.  All other systems reviewed and are negative.   Blood pressure 128/83, pulse 90, height 5\' 8"  (1.727 m), weight 251 lb (113.9 kg), SpO2 91 %.Body mass index is 38.16 kg/m.  General  Appearance: Casual and Fairly Groomed  Eye Contact:  Good  Speech:  Clear and Coherent  Volume:  Normal  Mood:  Euthymic  Affect:  Appropriate and Congruent  Thought Process:  Goal Directed  Orientation:  Full (Time, Place, and Person)  Thought Content: Rumination   Suicidal Thoughts:  No  Homicidal Thoughts:  No  Memory:  Immediate;   Good Recent;   Good Remote;   Good  Judgement:  Good  Insight:  Fair  Psychomotor Activity:  Decreased  Concentration:  Concentration: Good and Attention Span: Good  Recall:  Good  Fund of Knowledge: Good  Language: Good  Akathisia:  No  Handed:  Right  AIMS (if indicated): not done  Assets:  Communication Skills Desire for Improvement Resilience Social Support Talents/Skills  ADL's:  Intact  Cognition: WNL  Sleep:  Good   Screenings:   Assessment and Plan: This patient is a 68 year old male with a history of bipolar disorder.  He is stable on his current regimen.  His last Depakote level was 74.  He will continue Depakote 1500 mg at bedtime and Seroquel 25 mg at bedtime.  He will check a Depakote level before his next visit in 6 months   Levonne Spiller, MD 02/14/2018, 9:52 AM

## 2018-07-27 DIAGNOSIS — M47816 Spondylosis without myelopathy or radiculopathy, lumbar region: Secondary | ICD-10-CM | POA: Diagnosis not present

## 2018-07-27 DIAGNOSIS — M9901 Segmental and somatic dysfunction of cervical region: Secondary | ICD-10-CM | POA: Diagnosis not present

## 2018-07-27 DIAGNOSIS — M9902 Segmental and somatic dysfunction of thoracic region: Secondary | ICD-10-CM | POA: Diagnosis not present

## 2018-07-27 DIAGNOSIS — M9903 Segmental and somatic dysfunction of lumbar region: Secondary | ICD-10-CM | POA: Diagnosis not present

## 2018-07-27 DIAGNOSIS — M546 Pain in thoracic spine: Secondary | ICD-10-CM | POA: Diagnosis not present

## 2018-08-15 ENCOUNTER — Ambulatory Visit (HOSPITAL_COMMUNITY): Payer: Medicare Other | Admitting: Psychiatry

## 2018-08-15 ENCOUNTER — Telehealth (HOSPITAL_COMMUNITY): Payer: Self-pay | Admitting: Psychiatry

## 2018-08-15 ENCOUNTER — Other Ambulatory Visit: Payer: Self-pay

## 2018-09-02 DIAGNOSIS — R21 Rash and other nonspecific skin eruption: Secondary | ICD-10-CM | POA: Diagnosis not present

## 2018-09-02 DIAGNOSIS — Z6837 Body mass index (BMI) 37.0-37.9, adult: Secondary | ICD-10-CM | POA: Diagnosis not present

## 2018-10-22 DIAGNOSIS — Z23 Encounter for immunization: Secondary | ICD-10-CM | POA: Diagnosis not present

## 2019-01-09 DIAGNOSIS — M545 Low back pain: Secondary | ICD-10-CM | POA: Diagnosis not present

## 2019-01-09 DIAGNOSIS — Z8042 Family history of malignant neoplasm of prostate: Secondary | ICD-10-CM | POA: Diagnosis not present

## 2019-01-09 DIAGNOSIS — R3911 Hesitancy of micturition: Secondary | ICD-10-CM | POA: Diagnosis not present

## 2019-01-09 DIAGNOSIS — N401 Enlarged prostate with lower urinary tract symptoms: Secondary | ICD-10-CM | POA: Diagnosis not present

## 2019-01-28 ENCOUNTER — Other Ambulatory Visit (HOSPITAL_COMMUNITY): Payer: Self-pay | Admitting: Psychiatry

## 2019-01-28 NOTE — Telephone Encounter (Signed)
Call for appt

## 2019-01-30 NOTE — Telephone Encounter (Signed)
Per provider: Call for appt LVM to call to SCHEDULE Appt

## 2019-02-20 ENCOUNTER — Telehealth (HOSPITAL_COMMUNITY): Payer: Self-pay | Admitting: *Deleted

## 2019-02-20 NOTE — Telephone Encounter (Signed)
?   ABOUT MED REFILL [facility] CALLED PATIENT STATED UNABLE TO GET REFILLS. FEFI;LS SENT ON 02/15/2019 LVM TO INFORM

## 2019-02-24 ENCOUNTER — Ambulatory Visit: Payer: Medicare Other | Admitting: Urology

## 2019-02-24 DIAGNOSIS — R3911 Hesitancy of micturition: Secondary | ICD-10-CM | POA: Insufficient documentation

## 2019-03-02 DIAGNOSIS — M9902 Segmental and somatic dysfunction of thoracic region: Secondary | ICD-10-CM | POA: Diagnosis not present

## 2019-03-02 DIAGNOSIS — M546 Pain in thoracic spine: Secondary | ICD-10-CM | POA: Diagnosis not present

## 2019-03-02 DIAGNOSIS — M9901 Segmental and somatic dysfunction of cervical region: Secondary | ICD-10-CM | POA: Diagnosis not present

## 2019-03-02 DIAGNOSIS — M9903 Segmental and somatic dysfunction of lumbar region: Secondary | ICD-10-CM | POA: Diagnosis not present

## 2019-03-02 DIAGNOSIS — M47816 Spondylosis without myelopathy or radiculopathy, lumbar region: Secondary | ICD-10-CM | POA: Diagnosis not present

## 2019-03-10 DIAGNOSIS — E7849 Other hyperlipidemia: Secondary | ICD-10-CM | POA: Diagnosis not present

## 2019-03-10 DIAGNOSIS — E1165 Type 2 diabetes mellitus with hyperglycemia: Secondary | ICD-10-CM | POA: Diagnosis not present

## 2019-03-16 ENCOUNTER — Other Ambulatory Visit: Payer: Self-pay

## 2019-03-16 ENCOUNTER — Encounter (HOSPITAL_COMMUNITY): Payer: Self-pay | Admitting: Psychiatry

## 2019-03-16 ENCOUNTER — Ambulatory Visit (INDEPENDENT_AMBULATORY_CARE_PROVIDER_SITE_OTHER): Payer: Medicare Other | Admitting: Psychiatry

## 2019-03-16 DIAGNOSIS — F3162 Bipolar disorder, current episode mixed, moderate: Secondary | ICD-10-CM | POA: Diagnosis not present

## 2019-03-16 NOTE — Progress Notes (Signed)
Virtual Visit via Telephone Note  I connected with Andrew Warren on 03/16/19 at  1:20 PM EST by telephone and verified that I am speaking with the correct person using two identifiers.   I discussed the limitations, risks, security and privacy concerns of performing an evaluation and management service by telephone and the availability of in person appointments. I also discussed with the patient that there may be a patient responsible charge related to this service. The patient expressed understanding and agreed to proceed.     I discussed the assessment and treatment plan with the patient. The patient was provided an opportunity to ask questions and all were answered. The patient agreed with the plan and demonstrated an understanding of the instructions.   The patient was advised to call back or seek an in-person evaluation if the symptoms worsen or if the condition fails to improve as anticipated.  I provided 15 minutes of non-face-to-face time during this encounter.   Andrew Spiller, MD  [facility] MD/PA/NP OP Progress Note  03/16/2019 1:43 PM Andrew Warren  MRN:  [id number]  Chief Complaint:  Chief Complaint    Manic Behavior; Depression; Follow-up     HPI: This patient is a 69 year old married white male who lives with his wife in [place]. He has 4 grown children. 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  The patient states that when he was a young adult he was manic and out of control. He was initially misdiagnosed as being paranoid schizophrenic. He was in and out of hospitals proximally 7 times between the ages of 30 and 66. He was eventually more accurately diagnosed as being bipolar and was started on lithium. He did very well on lithium for a number of years and was monitored by his family doctor, Dr. Nadara Mustard in [place].  Several months ago however he began to be very oversedated. Apparently his renal function was declining and  his lithium level had risen. He was seen by nephrologist who recommended he get off lithium immediately. Since then his family Dr. put him on low doses of Seroquel and Depakote. 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  The patient returns for follow-up after 1 year .  He had missed his last appointment.  He states that he has been doing fairly well except his blood sugars are getting quite high between 200-300.  His primary doctor has changed his diabetic medication recently.  The patient asked me about Seroquel.  He is only on 25 mg so I doubt this is had much of an effect on blood sugar.  He has a sedentary job and probably does not get enough exercise.  He also eats a good deal of fast food.  I explained to him that he would need to make some lifestyle choices.  If we change to another mood stabilizer we probably would be in the same boat.  I do not want to take the risk of him having to go back into the hospital with a manic or depressive episode.  He has been quite stable on the current combination and he agrees.  He denies significant depression or manic symptoms or suicidal ideation at present.   Visit Diagnosis:    ICD-10-CM   1. Bipolar 1 disorder, mixed, moderate (HCC)  F31.62     Past Psychiatric History: Hospitalized  numerous times as a younger person for bipolar disorder  Past Medical History:  Past Medical History:  Diagnosis Date  . Adenomatous polyps 2004   per H&P in echart by Dr. Arnoldo Morale  . Bipolar disorder ([place])   . BPH (benign prostatic hyperplasia)   . Chronic kidney disease   . Diabetes mellitus   . Genital warts   . High cholesterol   . Rheumatoid arthritis(714.0)   . S/P colonoscopy Jan 2008   diverticulosis in sigmoid, Dr. Arnoldo Morale    Past Surgical History:  Procedure Laterality Date  . APPENDECTOMY    . BIOPSY  03/02/2016   Procedure: BIOPSY;   Surgeon: Daneil Dolin, MD;  [facility];  Service: Endoscopy;;  biopsy evaluate for microscopic colitis  . COLONOSCOPY WITH PROPOFOL N/A 03/02/2016   Procedure: COLONOSCOPY WITH PROPOFOL;  Surgeon: Daneil Dolin, MD;  [facility];  Service: Endoscopy;  Laterality: N/A;  10:00am  . GALLBLADDER SURGERY  11/2014  . POLYPECTOMY  03/02/2016   Procedure: POLYPECTOMY;  Surgeon: Daneil Dolin, MD;  [facility];  Service: Endoscopy;;  cecal polyp  . varicocele repair      Family Psychiatric History: See below  Family History:  Family History  Problem Relation Age of Onset  . Dementia Mother   . Schizophrenia Father   . Diabetes Father   . Heart disease Maternal Grandfather   . Diabetes Maternal Grandfather   . Heart disease Maternal Grandmother   . Diabetes Maternal Grandmother   . Heart disease Paternal Grandfather   . Diabetes Paternal Grandfather   . Heart disease Paternal Grandmother   . Diabetes Paternal Grandmother   . Seizures Sister   . Hypertension Sister   . Heart attack Brother   . Colon cancer Neg Hx     Social History:  Social History   Socioeconomic History  . Marital status: Married    Spouse name: Not on file  . Number of children: Not on file  . Years of education: Not on file  . Highest education level: Not on file  Occupational History  . Not on file  Tobacco Use  . Smoking status: Former Smoker    Packs/day: 3.00    Years: 30.00    Pack years: 90.00    Types: Cigarettes    Quit date: 08/09/2005    Years since quitting: 13.6  . Smokeless tobacco: Never Used  Substance and Sexual Activity  . Alcohol use: No  . Drug use: No  . Sexual activity: Yes  Other Topics Concern  . Not on file  Social History Narrative  . Not on file   Social Determinants of Health   Financial Resource Strain:   . Difficulty of Paying Living Expenses: Not on file  Food Insecurity:   . Worried About Charity fundraiser in the Last Year:  Not on file  . Ran Out of Food in the Last Year: Not on file  Transportation Needs:   . Lack of Transportation (Medical): Not on file  . Lack of Transportation (Non-Medical): Not on file  Physical Activity:   . Days of Exercise per Week: Not on file  . Minutes of Exercise per Session: Not on file  Stress:   . Feeling of Stress : Not on file  Social Connections:   . Frequency of Communication with Friends and Family: Not on file  . Frequency of Social Gatherings with Friends and Family: Not on file  . Attends  Religious Services: Not on file  . Active Member of Clubs or Organizations: Not on file  . Attends Archivist Meetings: Not on file  . Marital Status: Not on file    Allergies:  Allergies  Allergen Reactions  . Penicillins Shortness Of Breath    Has patient had a PCN reaction causing immediate rash, facial/tongue/throat swelling, SOB or lightheadedness with hypotension: Yes Has patient had a PCN reaction causing severe rash involving mucus membranes or skin necrosis: No Has patient had a PCN reaction that required hospitalization No Has patient had a PCN reaction occurring within the last 10 years: Yes If all of the above answers are "NO", then may proceed with Cephalosporin use.  Shortness of breath/ tightning in throat  . Lithium     Caused renal failure    Metabolic Disorder Labs: Lab Results  Component Value Date   HGBA1C 10.0 (H) 03/26/2014   MPG 240 03/26/2014   MPG 131 (H) 08/11/2012   No results found for: PROLACTIN Lab Results  Component Value Date   CHOL 189 03/26/2014   TRIG 299 (H) 03/26/2014   HDL 29 (L) 03/26/2014   CHOLHDL 6.5 03/26/2014   VLDL 60 (H) 03/26/2014   LDLCALC 100 (H) 03/26/2014   Lab Results  Component Value Date   TSH 2.251 03/26/2014    Therapeutic Level Labs: No results found for: LITHIUM Lab Results  Component Value Date   VALPROATE 74.7 08/27/2017   VALPROATE 49.0 (L) 01/20/2017   No components found for:   CBMZ  Current Medications: Current Outpatient Medications  Medication Sig Dispense Refill  . acetaminophen (TYLENOL) 650 MG CR tablet Take 1,300 mg by mouth every 8 (eight) hours as needed for pain.    Marland Kitchen atorvastatin (LIPITOR) 10 MG tablet Take 1 tablet (10 mg total) by mouth daily at 6 PM. 30 tablet 3  . Coenzyme Q10 (CO Q 10 PO) Take 1 capsule by mouth daily.    . divalproex (DEPAKOTE) 500 MG DR tablet Take three at bedtime 270 tablet 2  . glipiZIDE (GLUCOTROL XL) 10 MG 24 hr tablet Take 10 mg by mouth 2 (two) times daily.    Marland Kitchen lisinopril (PRINIVIL,ZESTRIL) 5 MG tablet Take 5 mg by mouth daily.    . polyethylene glycol-electrolytes (TRILYTE) 420 g solution Take 4,000 mLs by mouth as directed. (Patient not taking: Reported on 02/14/2018) 4000 mL 0  . QUEtiapine (SEROQUEL) 25 MG tablet TAKE 1 TABLET BY MOUTH AT BEDTIME 90 tablet 3  . RYBELSUS 7 MG TABS     . tamsulosin (FLOMAX) 0.4 MG CAPS capsule Take 0.4 mg by mouth daily.    . traMADol (ULTRAM) 50 MG tablet Take 50 mg by mouth 2 (two) times daily.     No current facility-administered medications for this visit.     Musculoskeletal: Strength & Muscle Tone: within normal limits Gait & Station: normal Patient leans: N/A  Psychiatric Specialty Exam: Review of Systems  Musculoskeletal: Positive for back pain.  All other systems reviewed and are negative.   There were no vitals taken for this visit.There is no height or weight on file to calculate BMI.  General Appearance: NA  Eye Contact:  NA  Speech:  Clear and Coherent  Volume:  Normal  Mood:  Euthymic  Affect:  NA  Thought Process:  Goal Directed  Orientation:  Full (Time, Place, and Person)  Thought Content: WDL   Suicidal Thoughts:  No  Homicidal Thoughts:  No  Memory:  Immediate;  Good Recent;   Good Remote;   Good  Judgement:  Good  Insight:  Fair  Psychomotor Activity:  Decreased  Concentration:  Concentration: Good and Attention Span: Good  Recall:  Good  Fund  of Knowledge: Good  Language: Good  Akathisia:  No  Handed:  Right  AIMS (if indicated): not done  Assets:  Communication Skills Desire for Improvement Resilience Social Support Talents/Skills  ADL's:  Intact  Cognition: WNL  Sleep:  Good   Screenings:   Assessment and Plan: This patient is a 69 year old male with a history of bipolar disorder.  He has been very stable on his current regimen.  I really do not think this low dose of Seroquel is worsening his blood sugar.  He states that he had recent labs done at [facility] we will try to get these and review them.  For now he will continue Depakote 1500 mg at bedtime and Seroquel 25 mg at bedtime for mood stabilization.  He will return to see me in 6 months   Andrew Spiller, MD 03/16/2019, 1:43 PM

## 2019-03-31 ENCOUNTER — Other Ambulatory Visit (HOSPITAL_COMMUNITY): Payer: Self-pay | Admitting: Psychiatry

## 2019-04-07 DIAGNOSIS — E7849 Other hyperlipidemia: Secondary | ICD-10-CM | POA: Diagnosis not present

## 2019-04-07 DIAGNOSIS — E1165 Type 2 diabetes mellitus with hyperglycemia: Secondary | ICD-10-CM | POA: Diagnosis not present

## 2019-04-18 ENCOUNTER — Telehealth (HOSPITAL_COMMUNITY): Payer: Self-pay | Admitting: *Deleted

## 2019-04-18 ENCOUNTER — Other Ambulatory Visit (HOSPITAL_COMMUNITY): Payer: Self-pay | Admitting: Psychiatry

## 2019-04-18 DIAGNOSIS — Z23 Encounter for immunization: Secondary | ICD-10-CM | POA: Diagnosis not present

## 2019-04-18 NOTE — Telephone Encounter (Signed)
Patient called for refill divalproex (DEPAKOTE) 500 MG DR tablet. Called Rx & patient has 0 refills &  0 on hold/file

## 2019-04-19 NOTE — Telephone Encounter (Signed)
90 day supply ordered on 1/6 with 2 refills

## 2019-05-10 DIAGNOSIS — E7849 Other hyperlipidemia: Secondary | ICD-10-CM | POA: Diagnosis not present

## 2019-05-10 DIAGNOSIS — E1165 Type 2 diabetes mellitus with hyperglycemia: Secondary | ICD-10-CM | POA: Diagnosis not present

## 2019-06-09 DIAGNOSIS — E7849 Other hyperlipidemia: Secondary | ICD-10-CM | POA: Diagnosis not present

## 2019-06-09 DIAGNOSIS — I10 Essential (primary) hypertension: Secondary | ICD-10-CM | POA: Diagnosis not present

## 2019-06-19 DIAGNOSIS — E782 Mixed hyperlipidemia: Secondary | ICD-10-CM | POA: Diagnosis not present

## 2019-06-19 DIAGNOSIS — N183 Chronic kidney disease, stage 3 unspecified: Secondary | ICD-10-CM | POA: Diagnosis not present

## 2019-06-19 DIAGNOSIS — F319 Bipolar disorder, unspecified: Secondary | ICD-10-CM | POA: Diagnosis not present

## 2019-06-19 DIAGNOSIS — E1165 Type 2 diabetes mellitus with hyperglycemia: Secondary | ICD-10-CM | POA: Diagnosis not present

## 2019-06-19 DIAGNOSIS — Z0001 Encounter for general adult medical examination with abnormal findings: Secondary | ICD-10-CM | POA: Diagnosis not present

## 2019-06-26 DIAGNOSIS — E1165 Type 2 diabetes mellitus with hyperglycemia: Secondary | ICD-10-CM | POA: Diagnosis not present

## 2019-06-26 DIAGNOSIS — Z6834 Body mass index (BMI) 34.0-34.9, adult: Secondary | ICD-10-CM | POA: Diagnosis not present

## 2019-06-26 DIAGNOSIS — M545 Low back pain: Secondary | ICD-10-CM | POA: Diagnosis not present

## 2019-06-26 DIAGNOSIS — E782 Mixed hyperlipidemia: Secondary | ICD-10-CM | POA: Diagnosis not present

## 2019-06-26 DIAGNOSIS — F319 Bipolar disorder, unspecified: Secondary | ICD-10-CM | POA: Diagnosis not present

## 2019-08-10 ENCOUNTER — Telehealth (HOSPITAL_COMMUNITY): Payer: Medicare Other | Admitting: Psychiatry

## 2019-08-16 ENCOUNTER — Telehealth (HOSPITAL_COMMUNITY): Payer: Medicare Other | Admitting: Psychiatry

## 2019-08-16 ENCOUNTER — Other Ambulatory Visit: Payer: Self-pay

## 2019-09-13 DIAGNOSIS — M9903 Segmental and somatic dysfunction of lumbar region: Secondary | ICD-10-CM | POA: Diagnosis not present

## 2019-09-13 DIAGNOSIS — M9901 Segmental and somatic dysfunction of cervical region: Secondary | ICD-10-CM | POA: Diagnosis not present

## 2019-09-13 DIAGNOSIS — M546 Pain in thoracic spine: Secondary | ICD-10-CM | POA: Diagnosis not present

## 2019-09-13 DIAGNOSIS — M9902 Segmental and somatic dysfunction of thoracic region: Secondary | ICD-10-CM | POA: Diagnosis not present

## 2019-09-13 DIAGNOSIS — M47816 Spondylosis without myelopathy or radiculopathy, lumbar region: Secondary | ICD-10-CM | POA: Diagnosis not present

## 2019-09-20 ENCOUNTER — Other Ambulatory Visit: Payer: Self-pay

## 2019-09-20 ENCOUNTER — Ambulatory Visit (INDEPENDENT_AMBULATORY_CARE_PROVIDER_SITE_OTHER): Payer: Medicare Other | Admitting: Gastroenterology

## 2019-09-20 ENCOUNTER — Encounter (INDEPENDENT_AMBULATORY_CARE_PROVIDER_SITE_OTHER): Payer: Self-pay | Admitting: Gastroenterology

## 2019-09-20 VITALS — BP 106/74 | HR 93 | Temp 98.4°F | Ht 68.0 in | Wt 224.6 lb

## 2019-09-20 DIAGNOSIS — K529 Noninfective gastroenteritis and colitis, unspecified: Secondary | ICD-10-CM | POA: Diagnosis not present

## 2019-09-20 MED ORDER — COLESTIPOL HCL 1 G PO TABS: 2.0000 g | ORAL_TABLET | Freq: Every day | ORAL | 3 refills | Status: AC

## 2019-09-20 NOTE — Patient Instructions (Signed)
We are checking stool for fecal elastase to exclude pancreatic insufficiency.  You can try Colestid for the diarrhea.  Please make sure to separate this from the other medications as we discussed.

## 2019-09-20 NOTE — Progress Notes (Addendum)
Patient profile: Andrew Warren is a 69 y.o. male seen for evaluation of diarrhea.    History of Present Illness: Andrew Warren is seen today for diarrhea - reports ongoing x 30 years at least. He reports currently wearing depends due to urgency w/ large volume stools that can become uncontrollable. He reports usually having 3-4 loose liquid stools per day. No alternating constipation. Has some abd pressure prior to episodes of defecation. Takes imodium 2-3x/day and levsin 3x/day-did not see large benefit with either of these. He reports stools are dark-black with very foul odor.   Denies n/v. No GERD symptom. No dysphagia. Feels urgency after meals limits his appetite. He reports weight loss as below is unintentional but states this began when he started a new diabetic medication, reports his A1c dropped from over 10-6 with new medication. He reports CCY 3-4 years ago made diarrhea worse. Takes 6 tylenol a day and 2 tramadol for back pain. Denies nsaids. Decreasing carbonation seemed to help diarrhea some.   Wt Readings from Last 3 Encounters:  09/20/19 224 lb 9.6 oz (101.9 kg)  12/02/17 245 lb (111.1 kg)  03/02/16 252 lb (114.3 kg)     Last Colonoscopy: 2018-diverticulosis in sigmoid descending, 4 mm polyp in cecum.  Pathology with benign colonic mucosa.  Random biopsies negative for microscopic colitis.   Last Endoscopy: None prior   Past Medical History:  Past Medical History:  Diagnosis Date   Adenomatous polyps 2004   per H&P in echart by Dr. Arnoldo Morale   Bipolar disorder [facility])    BPH (benign prostatic hyperplasia)    Chronic kidney disease    Diabetes mellitus    Genital warts    High cholesterol    Rheumatoid arthritis(714.0)    S/P colonoscopy Jan 2008   diverticulosis in sigmoid, Dr. 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   Diarrhea 12/01/2010   Adenomatous polyps 12/01/2010    Past Surgical History: Past Surgical History:  Procedure Laterality Date   APPENDECTOMY     BIOPSY  03/02/2016   Procedure: BIOPSY;  Surgeon: Daneil Dolin, MD;  [facility];  Service: Endoscopy;;  biopsy evaluate for microscopic colitis   COLONOSCOPY WITH PROPOFOL N/A 03/02/2016   Procedure: COLONOSCOPY WITH PROPOFOL;  Surgeon: Daneil Dolin, MD;  [facility];  Service: Endoscopy;  Laterality: N/A;  10:00am   GALLBLADDER SURGERY  11/2014   POLYPECTOMY  03/02/2016   Procedure: POLYPECTOMY;  Surgeon: Daneil Dolin, MD;  [facility];  Service: Endoscopy;;  cecal polyp   varicocele repair      Allergies: Allergies  Allergen Reactions   Penicillins Shortness Of Breath    Has patient had a PCN reaction causing immediate rash, facial/tongue/throat swelling, SOB or lightheadedness with hypotension: Yes Has patient had a PCN reaction causing severe rash involving mucus membranes or skin necrosis: No Has patient  had a PCN reaction that required hospitalization No Has patient had a PCN reaction occurring within the last 10 years: Yes If all of the above answers are "NO", then may proceed with Cephalosporin use.  Shortness of breath/ tightning in throat   Lithium     Caused renal failure      Home Medications:  Current Outpatient Medications:    acetaminophen (TYLENOL) 650 MG CR tablet, Take 1,300 mg by mouth every 8 (eight) hours as needed for pain., Disp: , Rfl:    atorvastatin (LIPITOR)  10 MG tablet, Take 1 tablet (10 mg total) by mouth daily at 6 PM., Disp: 30 tablet, Rfl: 3   Coenzyme Q10 (CO Q 10 PO), Take 1 capsule by mouth daily., Disp: , Rfl:    divalproex (DEPAKOTE) 500 MG DR tablet, TAKE 3 TABLETS BY MOUTH AT BEDTIME, Disp: 270 tablet, Rfl: 3   glipiZIDE (GLUCOTROL XL) 10 MG 24 hr tablet, Take 10 mg by mouth 2 (two) times daily., Disp: , Rfl:    hyoscyamine (LEVSIN SL) 0.125 MG SL tablet, Place under the tongue 3 (three) times daily., Disp: , Rfl:    lisinopril (PRINIVIL,ZESTRIL) 5 MG tablet, Take 5 mg by mouth daily., Disp: , Rfl:    QUEtiapine (SEROQUEL) 25 MG tablet, TAKE 1 TABLET BY MOUTH AT BEDTIME, Disp: 90 tablet, Rfl: 3   RYBELSUS 7 MG TABS, Take 7 mg by mouth. Patient reports that he takes 1/2 of the 7 mg daily., Disp: , Rfl:    tamsulosin (FLOMAX) 0.4 MG CAPS capsule, Take 0.4 mg by mouth daily., Disp: , Rfl:    traMADol (ULTRAM) 50 MG tablet, Take 50 mg by mouth 2 (two) times daily., Disp: , Rfl:    colestipol (COLESTID) 1 g tablet, Take 2 tablets (2 g total) by mouth daily. Take before lunch, Disp: 60 tablet, Rfl: 3   Family History: family history includes Dementia in his mother; Diabetes in his father, maternal grandfather, maternal grandmother, paternal grandfather, and paternal grandmother; Heart attack in his brother; Heart disease in his maternal grandfather, maternal grandmother, paternal grandfather, and paternal grandmother; Hypertension in his sister; Schizophrenia in his father; Seizures in his sister.    No family hx colon cancer, colon polyps, IBD  Social History:   reports that he quit smoking about 14 years ago. His smoking use included cigarettes. He has a 90.00 pack-year smoking history. He has never used smokeless tobacco. He reports that he does not drink alcohol and does not use drugs.   Review of Systems: Constitutional: Denies weight loss/weight gain  Eyes: No changes in vision. ENT: No oral lesions, sore throat.  GI: see  HPI.  Heme/Lymph: No easy bruising.  CV: No chest pain.  GU: No hematuria.  Integumentary: No rashes.  Neuro: No headaches.  Psych: No depression/anxiety.  Endocrine: No heat/cold intolerance.  Allergic/Immunologic: No urticaria.  Resp: No cough, SOB.  Musculoskeletal: No joint swelling.    Physical Examination: BP 106/74 (BP Location: Right Arm, Patient Position: Sitting, Cuff Size: Normal)    Pulse 93    Temp 98.4 F (36.9 C) (Oral)    Ht 5\' 8"  (1.727 m)    Wt 224 lb 9.6 oz (101.9 kg)    BMI 34.15 kg/m  Gen: NAD, alert and oriented x 4 HEENT: PEERLA, EOMI, Neck: supple, no JVD Chest: CTA bilaterally, no wheezes, crackles, or other adventitious sounds CV: RRR, no m/g/c/r Abd: soft, NT, ND, +BS in all four quadrants; no HSM, guarding, ridigity, or  rebound tenderness Rectal-Brown stool in rectal vault Hemoccult negative Ext: no edema, well perfused with 2+ pulses, Skin: no rash or lesions noted on observed skin Lymph: no noted LAD  Data Reviewed:   No labs available in epic or Care Everywhere.  Labs from his primary care requested   Assessment/Plan: Mr. Ponzo is a 69 y.o. male seen for chronic diarrhea  1.  Diarrhea-chronic issue, evaluated 2018 with negative work-up including infectious stool studies, colonoscopy with random biopsy.  Will check fecal elastase for evidence of possible pancreatic insufficiency.  He has tried Imodium and Levsin 3 times daily with continued symptoms.  He would like a medicine to try ASAP as he is traveling this weekend and diarrhea is problematic with traveling.  We will try him on a course of Colestid while awaiting fecal elastase testing, reviewed to separate from other medications.  2.  Weight loss-patient feels this is a combination of his Rybelsus as well as diarrhea causing him to avoid eating at times to decrease urgency.  He denies any pain with eating, etc.  3.  Melena-patient reports black stools, rectal exam completed during visit  showing brown stool that was Hemoccult negative.  We will request his labs from PCP to ensure no anemia.  Amol was seen today for new patient (initial visit).  Diagnoses and all orders for this visit:  Chronic diarrhea -     Pancreatic elastase, fecal  Other orders -     colestipol (COLESTID) 1 g tablet; Take 2 tablets (2 g total) by mouth daily. Take before lunch     I personally performed the service, non-incident to. (WP)  Laurine Blazer, [facility]

## 2019-10-02 DIAGNOSIS — K529 Noninfective gastroenteritis and colitis, unspecified: Secondary | ICD-10-CM | POA: Diagnosis not present

## 2019-10-04 DIAGNOSIS — H6691 Otitis media, unspecified, right ear: Secondary | ICD-10-CM | POA: Diagnosis not present

## 2019-10-04 DIAGNOSIS — M25511 Pain in right shoulder: Secondary | ICD-10-CM | POA: Diagnosis not present

## 2019-10-06 ENCOUNTER — Telehealth (INDEPENDENT_AMBULATORY_CARE_PROVIDER_SITE_OTHER): Payer: Self-pay | Admitting: Gastroenterology

## 2019-10-06 NOTE — Telephone Encounter (Signed)
Andrew Warren - patient did not turn in his fecal elastase test. He should do this for evaluation of diarrhea. He has a f/up appt in a few weeks. Can move up sooner if he has concerns. Thanks!

## 2019-10-06 NOTE — Telephone Encounter (Signed)
Patient came by office stated he would like to talk to you - didn't say what about - ph# [phone]

## 2019-10-07 LAB — PANCREATIC ELASTASE, FECAL: Pancreatic Elastase-1, Stool: >500 mcg/g

## 2019-10-09 NOTE — Telephone Encounter (Signed)
Please notify patient the result of his stool study was normal.  He does not have any evidence of pancreatic insufficiency.  Hopefully he is having some improvement with the colestipol prescribed at his visit.  We can discuss further at his follow-up appointment.

## 2019-10-09 NOTE — Telephone Encounter (Signed)
Called and notified pt of his results of his stool study. Informed patient that his results were normal, pt said that colestipol is helping and would like another refill sent in to [facility] in [place], he stated that he has changed his eating habits and found that has also helped. Pt said that he follow up with his appt. In sept if he is not out of town.

## 2019-10-09 NOTE — Telephone Encounter (Signed)
Called and spoke with patient he said that he did do test and test and [facility] to [facility] on last Monday, he stopped by the office on last Friday to find out the results of the test. Pt is okay with is appt., concerns about his results.

## 2019-10-10 DIAGNOSIS — N183 Chronic kidney disease, stage 3 unspecified: Secondary | ICD-10-CM | POA: Diagnosis not present

## 2019-10-10 DIAGNOSIS — E7849 Other hyperlipidemia: Secondary | ICD-10-CM | POA: Diagnosis not present

## 2019-10-10 DIAGNOSIS — E1165 Type 2 diabetes mellitus with hyperglycemia: Secondary | ICD-10-CM | POA: Diagnosis not present

## 2019-10-10 DIAGNOSIS — Z7984 Long term (current) use of oral hypoglycemic drugs: Secondary | ICD-10-CM | POA: Diagnosis not present

## 2019-10-24 DIAGNOSIS — M9901 Segmental and somatic dysfunction of cervical region: Secondary | ICD-10-CM | POA: Diagnosis not present

## 2019-10-24 DIAGNOSIS — M47814 Spondylosis without myelopathy or radiculopathy, thoracic region: Secondary | ICD-10-CM | POA: Diagnosis not present

## 2019-10-24 DIAGNOSIS — M9903 Segmental and somatic dysfunction of lumbar region: Secondary | ICD-10-CM | POA: Diagnosis not present

## 2019-10-24 DIAGNOSIS — M47816 Spondylosis without myelopathy or radiculopathy, lumbar region: Secondary | ICD-10-CM | POA: Diagnosis not present

## 2019-10-24 DIAGNOSIS — M542 Cervicalgia: Secondary | ICD-10-CM | POA: Diagnosis not present

## 2019-10-24 DIAGNOSIS — M47812 Spondylosis without myelopathy or radiculopathy, cervical region: Secondary | ICD-10-CM | POA: Diagnosis not present

## 2019-10-24 DIAGNOSIS — M546 Pain in thoracic spine: Secondary | ICD-10-CM | POA: Diagnosis not present

## 2019-10-24 DIAGNOSIS — M9902 Segmental and somatic dysfunction of thoracic region: Secondary | ICD-10-CM | POA: Diagnosis not present

## 2019-10-26 DIAGNOSIS — M47812 Spondylosis without myelopathy or radiculopathy, cervical region: Secondary | ICD-10-CM | POA: Diagnosis not present

## 2019-10-26 DIAGNOSIS — M546 Pain in thoracic spine: Secondary | ICD-10-CM | POA: Diagnosis not present

## 2019-10-26 DIAGNOSIS — M9902 Segmental and somatic dysfunction of thoracic region: Secondary | ICD-10-CM | POA: Diagnosis not present

## 2019-10-26 DIAGNOSIS — M47816 Spondylosis without myelopathy or radiculopathy, lumbar region: Secondary | ICD-10-CM | POA: Diagnosis not present

## 2019-10-26 DIAGNOSIS — M47814 Spondylosis without myelopathy or radiculopathy, thoracic region: Secondary | ICD-10-CM | POA: Diagnosis not present

## 2019-10-26 DIAGNOSIS — M542 Cervicalgia: Secondary | ICD-10-CM | POA: Diagnosis not present

## 2019-10-26 DIAGNOSIS — M9901 Segmental and somatic dysfunction of cervical region: Secondary | ICD-10-CM | POA: Diagnosis not present

## 2019-10-26 DIAGNOSIS — M9903 Segmental and somatic dysfunction of lumbar region: Secondary | ICD-10-CM | POA: Diagnosis not present

## 2019-11-06 ENCOUNTER — Telehealth: Payer: Self-pay | Admitting: Orthopedic Surgery

## 2019-11-06 ENCOUNTER — Ambulatory Visit (INDEPENDENT_AMBULATORY_CARE_PROVIDER_SITE_OTHER): Payer: Medicare Other | Admitting: Gastroenterology

## 2019-11-06 DIAGNOSIS — S4992XA Unspecified injury of left shoulder and upper arm, initial encounter: Secondary | ICD-10-CM | POA: Diagnosis not present

## 2019-11-06 DIAGNOSIS — M25512 Pain in left shoulder: Secondary | ICD-10-CM | POA: Diagnosis not present

## 2019-11-06 DIAGNOSIS — Z6832 Body mass index (BMI) 32.0-32.9, adult: Secondary | ICD-10-CM | POA: Diagnosis not present

## 2019-11-06 NOTE — Telephone Encounter (Signed)
Voice message received from patient earlier today regarding his shoulder - call returned; reached patient's voice mail; left message to return call.

## 2019-11-09 ENCOUNTER — Ambulatory Visit (INDEPENDENT_AMBULATORY_CARE_PROVIDER_SITE_OTHER): Payer: Medicare Other | Admitting: Orthopaedic Surgery

## 2019-11-09 ENCOUNTER — Other Ambulatory Visit: Payer: Self-pay

## 2019-11-09 VITALS — BP 131/74 | HR 75

## 2019-11-09 DIAGNOSIS — M25512 Pain in left shoulder: Secondary | ICD-10-CM | POA: Diagnosis not present

## 2019-11-09 MED ORDER — HYDROCODONE-ACETAMINOPHEN 5-325 MG PO TABS: 1.0000 | ORAL_TABLET | Freq: Four times a day (QID) | ORAL | 0 refills | Status: DC | PRN

## 2019-11-09 NOTE — Progress Notes (Signed)
Office Visit Note   Patient: Andrew Warren           Date of Birth: [birth year]           MRN: [id number] Visit Date: 11/09/2019              Requested by: [name], MD [place],  [place] [address] PCP: [name], MD   Assessment & Plan: Visit Diagnoses:  1. Left shoulder pain, unspecified chronicity   2. Acute pain of left shoulder     Plan: Patient requests a refill on Norco 15 tablets and he states the tramadol is not helping.  X-rays of his shoulder reviewed which are negative for acute changes.  These are reviewed on PACS from 11/07/2019 and radiologist read out showed negative findings as well.  We will schedule him for an MRI scan to rule out acute rotator cuff tear.  Subacromial injection performed he noted slight improvement but still did not have active range of motion of his shoulder.  Follow-up after MRI scan left shoulder.  Follow-Up Instructions: after MRI   Orders:  Orders Placed This Encounter  Procedures   MR Shoulder Left w/o contrast   Meds ordered this encounter  Medications   HYDROcodone-acetaminophen (NORCO/VICODIN) 5-325 MG tablet    Sig: Take 1 tablet by mouth every 6 (six) hours as needed for moderate pain.    Dispense:  15 tablet    Refill:  0      Procedures: No procedures performed   Clinical Data: No additional findings.   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  He has had trouble reaching his hand to his mouth is not been able to drive and states he has not been able to work.  He had a prescription for Norco and has been on chronic tramadol 60 tablets a month.  Patient is right-hand dominant states pain has been so severe he is  not been able to sleep.  He has been treated by chiropractor in the past and states he has been told he may have some rotator cuff issues.  Review of Systems 14 point systems positive for bipolar disorder BPH kidney disease hypercholesterol W diabetes increased BMI 37 all other systems are noncontributory to HPI.   Objective: Vital Signs: BP 131/74    Pulse 75   Physical Exam Constitutional:      Appearance: He is well-developed.  HENT:     Head: Normocephalic and atraumatic.  Eyes:     Pupils: Pupils are equal, round, and reactive to light.  Neck:     Thyroid: No thyromegaly.     Trachea: No tracheal deviation.  Cardiovascular:     Rate and Rhythm: Normal rate.  Pulmonary:     Effort: Pulmonary effort is normal.     Breath sounds: No wheezing.  Abdominal:     General: Bowel sounds are normal.     Palpations: Abdomen is soft.  Skin:    General: Skin is warm and dry.     Capillary Refill: Capillary refill takes less than 2 seconds.  Neurological:     Mental Status: He is alert and oriented to person,  place, and time.  Psychiatric:        Behavior: Behavior normal.        Thought Content: Thought content normal.        Judgment: Judgment normal.     Ortho Exam patient has 10 to 15 degrees flexion and only 10 to 15 degrees of abduction before he has severe pain in his shoulder.  He complains of severe pain with palpation over the biceps muscle although there is no distal migration of the muscle no ecchymosis.  Extreme tenderness anteriorly over the shoulder and biceps groove.  He has pain with subscap and external rotator cuff muscle resistive testing.  Station hand is intact he can flex and extend his fingers.  Specialty Comments:  No specialty comments available.  Imaging: No results found.   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 Arnoldo Morale   Bipolar disorder [facility])    BPH (benign prostatic hyperplasia)    Chronic kidney disease    Diabetes mellitus    Genital warts    High cholesterol    Rheumatoid arthritis(714.0)    S/P colonoscopy Jan 2008   diverticulosis in sigmoid, Dr. Arnoldo Morale    Family History  Problem Relation Age of Onset   Dementia Mother    Schizophrenia Father    Diabetes Father    Heart disease Maternal Grandfather    Diabetes Maternal Grandfather    Heart disease Maternal Grandmother    Diabetes Maternal Grandmother    Heart disease Paternal Grandfather    Diabetes Paternal Grandfather    Heart disease Paternal Grandmother    Diabetes Paternal Grandmother    Seizures Sister    Hypertension Sister    Heart attack Brother    Colon cancer Neg Hx     Past Surgical History:  Procedure Laterality Date   APPENDECTOMY     BIOPSY  03/02/2016   Procedure: BIOPSY;  Surgeon: Daneil Dolin, MD;  [facility];  Service: Endoscopy;;  biopsy evaluate for microscopic colitis   COLONOSCOPY WITH PROPOFOL N/A 03/02/2016   Procedure: COLONOSCOPY WITH PROPOFOL;  Surgeon: Daneil Dolin, MD;  [facility];  Service: Endoscopy;  Laterality: N/A;  10:00am   GALLBLADDER SURGERY  11/2014   POLYPECTOMY  03/02/2016   Procedure: POLYPECTOMY;  Surgeon: Daneil Dolin, MD;  [facility];   Service: Endoscopy;;  cecal polyp   varicocele repair     Social History   Occupational History   Not on file  Tobacco Use   Smoking status: Former Smoker    Packs/day: 3.00    Years: 30.00    Pack years: 90.00    Types: Cigarettes    Quit date: 08/09/2005  Years since quitting: 14.2   Smokeless tobacco: Never Used  Substance and Sexual Activity   Alcohol use: No   Drug use: No   Sexual activity: Yes

## 2019-11-10 ENCOUNTER — Telehealth: Payer: Self-pay

## 2019-11-10 DIAGNOSIS — M25512 Pain in left shoulder: Secondary | ICD-10-CM | POA: Insufficient documentation

## 2019-11-10 NOTE — Telephone Encounter (Signed)
Patient called stating that he found a place by the name of [facility] in [place], [place] that can do an open MRI.  Stated that a request/order needs to be faxed to [phone].   Cb# [phone] or [phone].  Cb# for patient is [phone].  Please advise.  Thank you.

## 2019-11-13 DIAGNOSIS — M25512 Pain in left shoulder: Secondary | ICD-10-CM | POA: Diagnosis not present

## 2019-11-13 NOTE — Telephone Encounter (Signed)
Returned pt call, left vm

## 2019-11-14 ENCOUNTER — Telehealth: Payer: Self-pay | Admitting: Orthopaedic Surgery

## 2019-11-14 ENCOUNTER — Other Ambulatory Visit: Payer: Self-pay | Admitting: Orthopaedic Surgery

## 2019-11-14 ENCOUNTER — Telehealth: Payer: Self-pay

## 2019-11-14 MED ORDER — HYDROCODONE-ACETAMINOPHEN 5-325 MG PO TABS: 1.0000 | ORAL_TABLET | Freq: Four times a day (QID) | ORAL | 0 refills | Status: DC | PRN

## 2019-11-14 NOTE — Telephone Encounter (Signed)
Pt would like a refill of his hydrocodone sent in to [facility] drug pharmacy. Pt also states he got his MRI today and since has fallen 3x so he would like to get a CT for his lower back as he believes that's where the problem is stemming from.   [phone] Pt would like a CB when rx has been sent in

## 2019-11-14 NOTE — Telephone Encounter (Signed)
Called pt back and left vm stating [facility] [place] and he can call to see if they have received fax

## 2019-11-14 NOTE — Telephone Encounter (Signed)
FYI-  [facility] radiologists called with report of MRI for left shoulder, stating Left shoulder Fx and Cancer.  Stated that MRI report will be faxed to our office.

## 2019-11-14 NOTE — Telephone Encounter (Signed)
Please advise. Thanks.  

## 2019-11-14 NOTE — Telephone Encounter (Signed)
I had called him and let him know looks like cancer in his shoulder. He has appt Thursday, needs several scans, bone scan , CT scans etc. May have brain mets making him fall so we will wait on another MRI in [facility] . FYI

## 2019-11-16 ENCOUNTER — Ambulatory Visit (INDEPENDENT_AMBULATORY_CARE_PROVIDER_SITE_OTHER): Payer: Medicare Other | Admitting: Orthopaedic Surgery

## 2019-11-16 ENCOUNTER — Other Ambulatory Visit: Payer: Self-pay

## 2019-11-16 DIAGNOSIS — C7951 Secondary malignant neoplasm of bone: Secondary | ICD-10-CM | POA: Diagnosis not present

## 2019-11-16 NOTE — Progress Notes (Signed)
Office Visit Note   Patient: Andrew Warren           Date of Birth: Sep 14, 1950           MRN: [id number] Visit Date: 11/16/2019              Requested by: [name], MD [place],  [place] [address] PCP: [name], MD   Assessment & Plan: Visit Diagnoses:  1. 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  His primary care practice will schedule him for appropriate metastatic work-up with CT scans etc.  Sling was applied for comfort.  He has Norco for pain which I previously given him.  He can follow-up with me on an as-needed basis.  Follow-Up Instructions: No follow-ups on file.   Orders:  No orders of the defined types were placed in this encounter.  No orders of the defined types were placed in this encounter.     Procedures: No procedures performed   Clinical Data: No additional findings.   Subjective: Chief Complaint  Patient presents with  . scan review    HPI 69 year old male returns he is here with his wife I talked with him on the phone.  Radiologist called me from [place] leaving a message at my office that the MRI scan showed evidence of metastatic disease to the humeral head and also coracoid and glenoid with several small separate foci consistent with metastatic disease.  I reviewed the MRI today given a copy of the report.  Dr. Nadara Mustard was off today I talked with his partner and they will call the patient to set up appropriate CT scans bone scan etc. for metastatic work-up.  Patient states he has had problems with balance and falling.  He had old history of back problems back in the 58s.  Initial plain radiographs were read by radiologist as normal when Dr. Nadara Mustard sent the patient to me.  I also looked at the x-rays on the disc and did not see any definite abnormalities.  In retrospect comparing it to the MRI there may be a hint in the midportion humeral head was  slight lytic area but this is hard to say.  I reviewed with patient explained to him that the previous x-rays were read as normal and no definite tumor was seen on initial x-rays.  MRI images were reviewed today with wife.  Review of Systems all other systems updated unchanged.  Patient does have bipolar disorder.  Recent falls severe shoulder pain with motion.   Objective: Vital Signs: BP 128/69   Pulse 80   Physical Exam Constitutional:      Appearance: He is well-developed.  HENT:     Head: Normocephalic and atraumatic.  Eyes:     Pupils: Pupils are equal, round, and reactive to light.  Neck:     Thyroid: No thyromegaly.     Trachea: No tracheal deviation.  Cardiovascular:     Rate and Rhythm: Normal rate.  Pulmonary:     Effort: Pulmonary effort is normal.     Breath sounds: No wheezing.  Abdominal:     General: Bowel sounds are normal.     Palpations: Abdomen is soft.  Skin:    General: Skin is warm and dry.     Capillary Refill: Capillary refill takes less than 2 seconds.  Neurological:     Mental  Status: He is alert and oriented to person, place, and time.  Psychiatric:        Behavior: Behavior normal.        Thought Content: Thought content normal.        Judgment: Judgment normal.     Ortho Exam patient has intact sensation hand but has pain with attempted shoulder range of motion. Specialty Comments:  No specialty comments available.  Imaging: No results found.   PMFS History: Patient Active Problem List   Diagnosis Date Noted  . 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 Pain in left shoulder 11/10/2019  . Hesitancy 02/24/2019  . Synovitis of left knee 12/03/2017  . Chronic diarrhea 01/20/2016  . Chest pain 03/26/2014  . Class 2 severe obesity due to excess calories with serious comorbidity and body mass index (BMI) of 36.0 to 36.9 in adult ([place]) 03/26/2014  . Diabetes mellitus ([place])   . Chronic kidney disease   . BPH (benign prostatic  hyperplasia)   . High cholesterol   . Pain in the chest   . Bipolar affective disorder ([place]) 06/30/2012  . 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 Diarrhea 12/01/2010  . Adenomatous polyps 12/01/2010   Past Medical History:  Diagnosis Date  . Adenomatous polyps 2004   per H&P in echart by Dr. Arnoldo Morale  . Bipolar disorder ([place])   . BPH (benign prostatic hyperplasia)   . Chronic kidney disease   . Diabetes mellitus   . Genital warts   . High cholesterol   . Rheumatoid arthritis(714.0)   . S/P colonoscopy Jan 2008   diverticulosis in sigmoid, Dr. Arnoldo Morale    Family History  Problem Relation Age of Onset  . Dementia Mother   . Schizophrenia Father   . Diabetes Father   . Heart disease Maternal Grandfather   . Diabetes Maternal Grandfather   . Heart disease Maternal Grandmother   . Diabetes Maternal Grandmother   . Heart disease Paternal Grandfather   . Diabetes Paternal Grandfather   . Heart disease Paternal Grandmother   . Diabetes Paternal Grandmother   . Seizures Sister   . Hypertension Sister   . Heart attack Brother   . Colon cancer Neg Hx     Past Surgical History:  Procedure Laterality Date  . APPENDECTOMY    . BIOPSY  03/02/2016   Procedure: BIOPSY;  Surgeon: Daneil Dolin, MD;  [facility];  Service: Endoscopy;;  biopsy evaluate for microscopic colitis  . COLONOSCOPY WITH PROPOFOL N/A 03/02/2016   Procedure: COLONOSCOPY WITH PROPOFOL;  Surgeon: Daneil Dolin, MD;  [facility];  Service: Endoscopy;  Laterality: N/A;  10:00am  . GALLBLADDER SURGERY  11/2014  . POLYPECTOMY  03/02/2016   Procedure: POLYPECTOMY;  Surgeon: Daneil Dolin, MD;  [facility];  Service: Endoscopy;;  cecal polyp  . varicocele repair     Social History   Occupational History  . Not on file  Tobacco Use  . Smoking status: Former Smoker    Packs/day: 3.00    Years: 30.00    Pack years: 90.00    Types: Cigarettes    Quit date: 08/09/2005     Years since quitting: 14.2  . Smokeless tobacco: Never Used  Substance and Sexual Activity  . Alcohol use: No  . Drug use: No  . Sexual activity: Yes

## 2019-11-20 DIAGNOSIS — K8689 Other specified diseases of pancreas: Secondary | ICD-10-CM | POA: Diagnosis not present

## 2019-11-20 DIAGNOSIS — I2510 Atherosclerotic heart disease of native coronary artery without angina pectoris: Secondary | ICD-10-CM | POA: Diagnosis not present

## 2019-11-20 DIAGNOSIS — R933 Abnormal findings on diagnostic imaging of other parts of digestive tract: Secondary | ICD-10-CM | POA: Diagnosis not present

## 2019-11-20 DIAGNOSIS — R93421 Abnormal radiologic findings on diagnostic imaging of right kidney: Secondary | ICD-10-CM | POA: Diagnosis not present

## 2019-11-20 DIAGNOSIS — I700 Atherosclerosis of aorta: Secondary | ICD-10-CM | POA: Diagnosis not present

## 2019-11-20 DIAGNOSIS — N281 Cyst of kidney, acquired: Secondary | ICD-10-CM | POA: Diagnosis not present

## 2019-11-20 DIAGNOSIS — K5730 Diverticulosis of large intestine without perforation or abscess without bleeding: Secondary | ICD-10-CM | POA: Diagnosis not present

## 2019-11-20 DIAGNOSIS — C419 Malignant neoplasm of bone and articular cartilage, unspecified: Secondary | ICD-10-CM | POA: Diagnosis not present

## 2019-11-20 DIAGNOSIS — R911 Solitary pulmonary nodule: Secondary | ICD-10-CM | POA: Diagnosis not present

## 2019-11-20 DIAGNOSIS — J8410 Pulmonary fibrosis, unspecified: Secondary | ICD-10-CM | POA: Diagnosis not present

## 2019-11-20 DIAGNOSIS — R918 Other nonspecific abnormal finding of lung field: Secondary | ICD-10-CM | POA: Diagnosis not present

## 2019-11-23 DIAGNOSIS — Z6833 Body mass index (BMI) 33.0-33.9, adult: Secondary | ICD-10-CM | POA: Diagnosis not present

## 2019-11-23 DIAGNOSIS — E1165 Type 2 diabetes mellitus with hyperglycemia: Secondary | ICD-10-CM | POA: Diagnosis not present

## 2019-11-23 DIAGNOSIS — C419 Malignant neoplasm of bone and articular cartilage, unspecified: Secondary | ICD-10-CM | POA: Diagnosis not present

## 2019-11-23 DIAGNOSIS — F319 Bipolar disorder, unspecified: Secondary | ICD-10-CM | POA: Diagnosis not present

## 2019-11-23 DIAGNOSIS — E782 Mixed hyperlipidemia: Secondary | ICD-10-CM | POA: Diagnosis not present

## 2019-11-24 DIAGNOSIS — R1313 Dysphagia, pharyngeal phase: Secondary | ICD-10-CM | POA: Diagnosis not present

## 2019-11-24 DIAGNOSIS — W19XXXA Unspecified fall, initial encounter: Secondary | ICD-10-CM | POA: Diagnosis not present

## 2019-11-24 DIAGNOSIS — F40240 Claustrophobia: Secondary | ICD-10-CM | POA: Diagnosis not present

## 2019-11-24 DIAGNOSIS — G893 Neoplasm related pain (acute) (chronic): Secondary | ICD-10-CM | POA: Diagnosis not present

## 2019-11-24 DIAGNOSIS — Z125 Encounter for screening for malignant neoplasm of prostate: Secondary | ICD-10-CM | POA: Diagnosis not present

## 2019-11-24 DIAGNOSIS — C159 Malignant neoplasm of esophagus, unspecified: Secondary | ICD-10-CM | POA: Diagnosis not present

## 2019-11-24 DIAGNOSIS — R2689 Other abnormalities of gait and mobility: Secondary | ICD-10-CM | POA: Diagnosis not present

## 2019-11-24 DIAGNOSIS — C7951 Secondary malignant neoplasm of bone: Secondary | ICD-10-CM | POA: Diagnosis not present

## 2019-12-01 ENCOUNTER — Other Ambulatory Visit: Payer: Self-pay | Admitting: Oncology

## 2019-12-01 DIAGNOSIS — C159 Malignant neoplasm of esophagus, unspecified: Secondary | ICD-10-CM

## 2019-12-01 DIAGNOSIS — C7951 Secondary malignant neoplasm of bone: Secondary | ICD-10-CM

## 2019-12-04 DIAGNOSIS — M25512 Pain in left shoulder: Secondary | ICD-10-CM | POA: Diagnosis not present

## 2019-12-04 DIAGNOSIS — C7951 Secondary malignant neoplasm of bone: Secondary | ICD-10-CM | POA: Diagnosis not present

## 2019-12-12 ENCOUNTER — Encounter (HOSPITAL_COMMUNITY)
Admission: RE | Admit: 2019-12-12 | Discharge: 2019-12-12 | Disposition: A | Discharge status: Home / Self Care | Payer: Medicare Other | Source: Ambulatory Visit | Attending: Oncology | Admitting: Oncology

## 2019-12-12 ENCOUNTER — Other Ambulatory Visit: Payer: Self-pay

## 2019-12-12 DIAGNOSIS — J439 Emphysema, unspecified: Secondary | ICD-10-CM | POA: Insufficient documentation

## 2019-12-12 DIAGNOSIS — C7951 Secondary malignant neoplasm of bone: Secondary | ICD-10-CM | POA: Diagnosis not present

## 2019-12-12 DIAGNOSIS — I2510 Atherosclerotic heart disease of native coronary artery without angina pectoris: Secondary | ICD-10-CM | POA: Insufficient documentation

## 2019-12-12 DIAGNOSIS — K7689 Other specified diseases of liver: Secondary | ICD-10-CM | POA: Diagnosis not present

## 2019-12-12 DIAGNOSIS — C159 Malignant neoplasm of esophagus, unspecified: Secondary | ICD-10-CM | POA: Diagnosis not present

## 2019-12-12 DIAGNOSIS — I700 Atherosclerosis of aorta: Secondary | ICD-10-CM | POA: Diagnosis not present

## 2019-12-12 LAB — GLUCOSE, CAPILLARY: Glucose-Capillary: 89 mg/dL (ref 70–99)

## 2019-12-12 MED ORDER — FLUDEOXYGLUCOSE F - 18 (FDG) INJECTION: 11.1000 | Freq: Once | INTRAVENOUS | Status: DC | PRN

## 2019-12-14 DIAGNOSIS — G893 Neoplasm related pain (acute) (chronic): Secondary | ICD-10-CM | POA: Diagnosis not present

## 2019-12-14 DIAGNOSIS — C7951 Secondary malignant neoplasm of bone: Secondary | ICD-10-CM | POA: Diagnosis not present

## 2019-12-14 DIAGNOSIS — K5903 Drug induced constipation: Secondary | ICD-10-CM | POA: Diagnosis not present

## 2019-12-14 DIAGNOSIS — N183 Chronic kidney disease, stage 3 unspecified: Secondary | ICD-10-CM | POA: Diagnosis not present

## 2019-12-14 DIAGNOSIS — C249 Malignant neoplasm of biliary tract, unspecified: Secondary | ICD-10-CM | POA: Diagnosis not present

## 2019-12-14 DIAGNOSIS — Z7189 Other specified counseling: Secondary | ICD-10-CM | POA: Diagnosis not present

## 2019-12-15 ENCOUNTER — Telehealth: Payer: Self-pay | Admitting: Orthopaedic Surgery

## 2019-12-15 DIAGNOSIS — M25512 Pain in left shoulder: Secondary | ICD-10-CM

## 2019-12-15 DIAGNOSIS — C7951 Secondary malignant neoplasm of bone: Secondary | ICD-10-CM

## 2019-12-15 NOTE — Telephone Encounter (Signed)
Pt called stating he would like a CB to be referred to a different oncology dr as the one he is seeing rn is trying to put some cords in his back without knowing what type of cancer he has and that doesn't sit right with him so he would like to get a 2nd opinion  [phone]

## 2019-12-15 NOTE — Telephone Encounter (Signed)
Pt wants to go to [place]  and see  Dr. Mali Tecot. Can you make referral. Doc in [facility] told him he could not do anything for him etc.

## 2019-12-15 NOTE — Telephone Encounter (Signed)
Patient called. He would like recommendations from Dr. Lorin Mercy for a MD about his cancer. His call back number is [phone]

## 2019-12-15 NOTE — Telephone Encounter (Signed)
Please advise 

## 2019-12-18 DIAGNOSIS — N281 Cyst of kidney, acquired: Secondary | ICD-10-CM | POA: Diagnosis not present

## 2019-12-18 DIAGNOSIS — K769 Liver disease, unspecified: Secondary | ICD-10-CM | POA: Diagnosis not present

## 2019-12-18 DIAGNOSIS — C7951 Secondary malignant neoplasm of bone: Secondary | ICD-10-CM | POA: Diagnosis not present

## 2019-12-18 DIAGNOSIS — R93421 Abnormal radiologic findings on diagnostic imaging of right kidney: Secondary | ICD-10-CM | POA: Diagnosis not present

## 2019-12-18 DIAGNOSIS — C249 Malignant neoplasm of biliary tract, unspecified: Secondary | ICD-10-CM | POA: Diagnosis not present

## 2019-12-18 DIAGNOSIS — R93422 Abnormal radiologic findings on diagnostic imaging of left kidney: Secondary | ICD-10-CM | POA: Diagnosis not present

## 2019-12-18 NOTE — Telephone Encounter (Signed)
Duplicate message. Dr. Lorin Mercy spoke with patient. Referral entered.

## 2019-12-18 NOTE — Telephone Encounter (Signed)
Referral entered for Dr. Mali Pecot at [facility].

## 2019-12-25 ENCOUNTER — Telehealth: Payer: Self-pay | Admitting: Hematology

## 2019-12-25 NOTE — Telephone Encounter (Signed)
Received a call from Mrs. Andrew Warren to schedule a new pt appt for metastatic cancer. [name] has been scheduled to see Dr. Burr Medico on 11/17 at 3pm. Pt's wife aware to arrive 30 minutes early.

## 2019-12-27 ENCOUNTER — Inpatient Hospital Stay (HOSPITAL_BASED_OUTPATIENT_CLINIC_OR_DEPARTMENT_OTHER): Payer: Medicare Other | Admitting: Hematology

## 2019-12-27 ENCOUNTER — Inpatient Hospital Stay (HOSPITAL_COMMUNITY): Payer: Medicare Other

## 2019-12-27 ENCOUNTER — Telehealth: Payer: Self-pay | Admitting: Emergency Medicine

## 2019-12-27 ENCOUNTER — Other Ambulatory Visit: Payer: Self-pay

## 2019-12-27 ENCOUNTER — Inpatient Hospital Stay (HOSPITAL_COMMUNITY)
Admission: AD | Admit: 2019-12-27 | Discharge: 2020-01-10 | DRG: 542 | Disposition: E | Payer: Medicare Other | Source: Ambulatory Visit | Attending: Internal Medicine | Admitting: Internal Medicine

## 2019-12-27 ENCOUNTER — Encounter: Payer: Self-pay | Admitting: Hematology

## 2019-12-27 DIAGNOSIS — K2289 Other specified disease of esophagus: Secondary | ICD-10-CM | POA: Diagnosis not present

## 2019-12-27 DIAGNOSIS — R350 Frequency of micturition: Secondary | ICD-10-CM

## 2019-12-27 DIAGNOSIS — K219 Gastro-esophageal reflux disease without esophagitis: Secondary | ICD-10-CM | POA: Diagnosis present

## 2019-12-27 DIAGNOSIS — G9341 Metabolic encephalopathy: Secondary | ICD-10-CM | POA: Diagnosis not present

## 2019-12-27 DIAGNOSIS — N401 Enlarged prostate with lower urinary tract symptoms: Secondary | ICD-10-CM | POA: Insufficient documentation

## 2019-12-27 DIAGNOSIS — G8911 Acute pain due to trauma: Secondary | ICD-10-CM

## 2019-12-27 DIAGNOSIS — E1151 Type 2 diabetes mellitus with diabetic peripheral angiopathy without gangrene: Secondary | ICD-10-CM | POA: Diagnosis present

## 2019-12-27 DIAGNOSIS — C799 Secondary malignant neoplasm of unspecified site: Secondary | ICD-10-CM | POA: Diagnosis not present

## 2019-12-27 DIAGNOSIS — G893 Neoplasm related pain (acute) (chronic): Secondary | ICD-10-CM | POA: Diagnosis present

## 2019-12-27 DIAGNOSIS — Z809 Family history of malignant neoplasm, unspecified: Secondary | ICD-10-CM | POA: Insufficient documentation

## 2019-12-27 DIAGNOSIS — Z20822 Contact with and (suspected) exposure to covid-19: Secondary | ICD-10-CM | POA: Diagnosis present

## 2019-12-27 DIAGNOSIS — E785 Hyperlipidemia, unspecified: Secondary | ICD-10-CM | POA: Diagnosis present

## 2019-12-27 DIAGNOSIS — D649 Anemia, unspecified: Secondary | ICD-10-CM | POA: Diagnosis present

## 2019-12-27 DIAGNOSIS — Z87891 Personal history of nicotine dependence: Secondary | ICD-10-CM

## 2019-12-27 DIAGNOSIS — K2980 Duodenitis without bleeding: Secondary | ICD-10-CM | POA: Diagnosis not present

## 2019-12-27 DIAGNOSIS — C7931 Secondary malignant neoplasm of brain: Secondary | ICD-10-CM | POA: Diagnosis not present

## 2019-12-27 DIAGNOSIS — C7951 Secondary malignant neoplasm of bone: Principal | ICD-10-CM | POA: Diagnosis present

## 2019-12-27 DIAGNOSIS — Z833 Family history of diabetes mellitus: Secondary | ICD-10-CM

## 2019-12-27 DIAGNOSIS — F319 Bipolar disorder, unspecified: Secondary | ICD-10-CM | POA: Diagnosis not present

## 2019-12-27 DIAGNOSIS — M5134 Other intervertebral disc degeneration, thoracic region: Secondary | ICD-10-CM | POA: Diagnosis not present

## 2019-12-27 DIAGNOSIS — M069 Rheumatoid arthritis, unspecified: Secondary | ICD-10-CM | POA: Insufficient documentation

## 2019-12-27 DIAGNOSIS — C779 Secondary and unspecified malignant neoplasm of lymph node, unspecified: Secondary | ICD-10-CM | POA: Diagnosis not present

## 2019-12-27 DIAGNOSIS — Z7189 Other specified counseling: Secondary | ICD-10-CM

## 2019-12-27 DIAGNOSIS — K5900 Constipation, unspecified: Secondary | ICD-10-CM | POA: Insufficient documentation

## 2019-12-27 DIAGNOSIS — D490 Neoplasm of unspecified behavior of digestive system: Secondary | ICD-10-CM | POA: Diagnosis not present

## 2019-12-27 DIAGNOSIS — C801 Malignant (primary) neoplasm, unspecified: Secondary | ICD-10-CM | POA: Diagnosis not present

## 2019-12-27 DIAGNOSIS — E1122 Type 2 diabetes mellitus with diabetic chronic kidney disease: Secondary | ICD-10-CM | POA: Diagnosis present

## 2019-12-27 DIAGNOSIS — N179 Acute kidney failure, unspecified: Secondary | ICD-10-CM | POA: Diagnosis not present

## 2019-12-27 DIAGNOSIS — K5730 Diverticulosis of large intestine without perforation or abscess without bleeding: Secondary | ICD-10-CM | POA: Diagnosis present

## 2019-12-27 DIAGNOSIS — R160 Hepatomegaly, not elsewhere classified: Secondary | ICD-10-CM

## 2019-12-27 DIAGNOSIS — M48061 Spinal stenosis, lumbar region without neurogenic claudication: Secondary | ICD-10-CM | POA: Diagnosis not present

## 2019-12-27 DIAGNOSIS — C787 Secondary malignant neoplasm of liver and intrahepatic bile duct: Secondary | ICD-10-CM

## 2019-12-27 DIAGNOSIS — Z66 Do not resuscitate: Secondary | ICD-10-CM | POA: Diagnosis not present

## 2019-12-27 DIAGNOSIS — Z8249 Family history of ischemic heart disease and other diseases of the circulatory system: Secondary | ICD-10-CM | POA: Insufficient documentation

## 2019-12-27 DIAGNOSIS — C159 Malignant neoplasm of esophagus, unspecified: Secondary | ICD-10-CM | POA: Diagnosis present

## 2019-12-27 DIAGNOSIS — C772 Secondary and unspecified malignant neoplasm of intra-abdominal lymph nodes: Secondary | ICD-10-CM

## 2019-12-27 DIAGNOSIS — E7800 Pure hypercholesterolemia, unspecified: Secondary | ICD-10-CM

## 2019-12-27 DIAGNOSIS — R948 Abnormal results of function studies of other organs and systems: Secondary | ICD-10-CM | POA: Diagnosis not present

## 2019-12-27 DIAGNOSIS — E870 Hyperosmolality and hypernatremia: Secondary | ICD-10-CM | POA: Diagnosis not present

## 2019-12-27 DIAGNOSIS — Z818 Family history of other mental and behavioral disorders: Secondary | ICD-10-CM

## 2019-12-27 DIAGNOSIS — I10 Essential (primary) hypertension: Secondary | ICD-10-CM

## 2019-12-27 DIAGNOSIS — Z515 Encounter for palliative care: Secondary | ICD-10-CM

## 2019-12-27 DIAGNOSIS — M2578 Osteophyte, vertebrae: Secondary | ICD-10-CM | POA: Diagnosis not present

## 2019-12-27 DIAGNOSIS — J8410 Pulmonary fibrosis, unspecified: Secondary | ICD-10-CM | POA: Diagnosis not present

## 2019-12-27 DIAGNOSIS — I129 Hypertensive chronic kidney disease with stage 1 through stage 4 chronic kidney disease, or unspecified chronic kidney disease: Secondary | ICD-10-CM | POA: Diagnosis not present

## 2019-12-27 DIAGNOSIS — L8990 Pressure ulcer of unspecified site, unspecified stage: Secondary | ICD-10-CM | POA: Insufficient documentation

## 2019-12-27 DIAGNOSIS — Z79899 Other long term (current) drug therapy: Secondary | ICD-10-CM | POA: Diagnosis not present

## 2019-12-27 DIAGNOSIS — M5126 Other intervertebral disc displacement, lumbar region: Secondary | ICD-10-CM | POA: Diagnosis not present

## 2019-12-27 DIAGNOSIS — N1832 Chronic kidney disease, stage 3b: Secondary | ICD-10-CM | POA: Diagnosis present

## 2019-12-27 DIAGNOSIS — M47816 Spondylosis without myelopathy or radiculopathy, lumbar region: Secondary | ICD-10-CM | POA: Diagnosis not present

## 2019-12-27 DIAGNOSIS — C155 Malignant neoplasm of lower third of esophagus: Secondary | ICD-10-CM | POA: Diagnosis not present

## 2019-12-27 DIAGNOSIS — R531 Weakness: Secondary | ICD-10-CM | POA: Diagnosis not present

## 2019-12-27 DIAGNOSIS — L89323 Pressure ulcer of left buttock, stage 3: Secondary | ICD-10-CM | POA: Diagnosis present

## 2019-12-27 DIAGNOSIS — Z7984 Long term (current) use of oral hypoglycemic drugs: Secondary | ICD-10-CM

## 2019-12-27 DIAGNOSIS — N400 Enlarged prostate without lower urinary tract symptoms: Secondary | ICD-10-CM | POA: Diagnosis present

## 2019-12-27 DIAGNOSIS — K31A12 Gastric intestinal metaplasia without dysplasia, involving the body (corpus): Secondary | ICD-10-CM | POA: Diagnosis not present

## 2019-12-27 DIAGNOSIS — E119 Type 2 diabetes mellitus without complications: Secondary | ICD-10-CM | POA: Diagnosis not present

## 2019-12-27 DIAGNOSIS — R933 Abnormal findings on diagnostic imaging of other parts of digestive tract: Secondary | ICD-10-CM | POA: Diagnosis not present

## 2019-12-27 DIAGNOSIS — I959 Hypotension, unspecified: Secondary | ICD-10-CM | POA: Diagnosis not present

## 2019-12-27 DIAGNOSIS — E875 Hyperkalemia: Secondary | ICD-10-CM | POA: Diagnosis not present

## 2019-12-27 DIAGNOSIS — J439 Emphysema, unspecified: Secondary | ICD-10-CM | POA: Diagnosis not present

## 2019-12-27 DIAGNOSIS — N189 Chronic kidney disease, unspecified: Secondary | ICD-10-CM

## 2019-12-27 DIAGNOSIS — F40240 Claustrophobia: Secondary | ICD-10-CM | POA: Diagnosis present

## 2019-12-27 DIAGNOSIS — K3189 Other diseases of stomach and duodenum: Secondary | ICD-10-CM | POA: Diagnosis not present

## 2019-12-27 DIAGNOSIS — K2970 Gastritis, unspecified, without bleeding: Secondary | ICD-10-CM | POA: Diagnosis present

## 2019-12-27 DIAGNOSIS — M47812 Spondylosis without myelopathy or radiculopathy, cervical region: Secondary | ICD-10-CM | POA: Diagnosis not present

## 2019-12-27 DIAGNOSIS — M4802 Spinal stenosis, cervical region: Secondary | ICD-10-CM | POA: Diagnosis not present

## 2019-12-27 DIAGNOSIS — K2990 Gastroduodenitis, unspecified, without bleeding: Secondary | ICD-10-CM | POA: Diagnosis not present

## 2019-12-27 DIAGNOSIS — K31A11 Gastric intestinal metaplasia without dysplasia, involving the antrum: Secondary | ICD-10-CM | POA: Diagnosis not present

## 2019-12-27 DIAGNOSIS — R32 Unspecified urinary incontinence: Secondary | ICD-10-CM | POA: Diagnosis present

## 2019-12-27 LAB — CBC WITH DIFFERENTIAL/PLATELET
Abs Immature Granulocytes: 0.5 10*3/uL — ABNORMAL HIGH (ref 0.00–0.07)
Basophils Absolute: 0.1 10*3/uL (ref 0.0–0.1)
Basophils Relative: 1 %
Eosinophils Absolute: 0.1 10*3/uL (ref 0.0–0.5)
Eosinophils Relative: 1 %
HCT: 38.7 % — ABNORMAL LOW (ref 39.0–52.0)
Hemoglobin: 11.9 g/dL — ABNORMAL LOW (ref 13.0–17.0)
Immature Granulocytes: 4 %
Lymphocytes Relative: 10 %
Lymphs Abs: 1.3 10*3/uL (ref 0.7–4.0)
MCH: 27.2 pg (ref 26.0–34.0)
MCHC: 30.7 g/dL (ref 30.0–36.0)
MCV: 88.4 fL (ref 80.0–100.0)
Monocytes Absolute: 1.5 10*3/uL — ABNORMAL HIGH (ref 0.1–1.0)
Monocytes Relative: 12 %
Neutro Abs: 9.6 10*3/uL — ABNORMAL HIGH (ref 1.7–7.7)
Neutrophils Relative %: 72 %
Platelets: 256 10*3/uL (ref 150–400)
RBC: 4.38 MIL/uL (ref 4.22–5.81)
RDW: 14.6 % (ref 11.5–15.5)
WBC: 13.1 10*3/uL — ABNORMAL HIGH (ref 4.0–10.5)
nRBC: 0 % (ref 0.0–0.2)

## 2019-12-27 LAB — PHOSPHORUS: Phosphorus: 4.4 mg/dL (ref 2.5–4.6)

## 2019-12-27 LAB — HEMOGLOBIN A1C
Hgb A1c MFr Bld: 5.9 % — ABNORMAL HIGH (ref 4.8–5.6)
Mean Plasma Glucose: 122.63 mg/dL

## 2019-12-27 LAB — URINALYSIS, ROUTINE W REFLEX MICROSCOPIC
Bilirubin Urine: NEGATIVE
Glucose, UA: NEGATIVE mg/dL
Hgb urine dipstick: NEGATIVE
Ketones, ur: NEGATIVE mg/dL
Leukocytes,Ua: NEGATIVE
Nitrite: NEGATIVE
Protein, ur: NEGATIVE mg/dL
Specific Gravity, Urine: 1.01 (ref 1.005–1.030)
pH: 6 (ref 5.0–8.0)

## 2019-12-27 LAB — COMPREHENSIVE METABOLIC PANEL
ALT: 12 U/L (ref 0–44)
AST: 19 U/L (ref 15–41)
Albumin: 3.3 g/dL — ABNORMAL LOW (ref 3.5–5.0)
Alkaline Phosphatase: 100 U/L (ref 38–126)
Anion gap: 11 (ref 5–15)
BUN: 51 mg/dL — ABNORMAL HIGH (ref 8–23)
CO2: 25 mmol/L (ref 22–32)
Calcium: 10.9 mg/dL — ABNORMAL HIGH (ref 8.9–10.3)
Chloride: 101 mmol/L (ref 98–111)
Creatinine, Ser: 1.89 mg/dL — ABNORMAL HIGH (ref 0.61–1.24)
GFR, Estimated: 38 mL/min — ABNORMAL LOW (ref >60)
Glucose, Bld: 92 mg/dL (ref 70–99)
Potassium: 4.8 mmol/L (ref 3.5–5.1)
Sodium: 137 mmol/L (ref 135–145)
Total Bilirubin: 0.5 mg/dL (ref 0.3–1.2)
Total Protein: 6.9 g/dL (ref 6.5–8.1)

## 2019-12-27 LAB — RESPIRATORY PANEL BY RT PCR (FLU A&B, COVID)
Influenza A by PCR: NEGATIVE
Influenza B by PCR: NEGATIVE
SARS Coronavirus 2 by RT PCR: NEGATIVE

## 2019-12-27 LAB — VITAMIN B12: Vitamin B-12: 696 pg/mL (ref 180–914)

## 2019-12-27 LAB — BLOOD GAS, VENOUS
Acid-Base Excess: 2 mmol/L (ref 0.0–2.0)
Bicarbonate: 26.9 mmol/L (ref 20.0–28.0)
O2 Saturation: 76.8 %
Patient temperature: 98.6
pCO2, Ven: 45.9 mmHg (ref 44.0–60.0)
pH, Ven: 7.386 (ref 7.250–7.430)
pO2, Ven: 46.1 mmHg — ABNORMAL HIGH (ref 32.0–45.0)

## 2019-12-27 LAB — MAGNESIUM: Magnesium: 2.5 mg/dL — ABNORMAL HIGH (ref 1.7–2.4)

## 2019-12-27 LAB — TSH: TSH: 0.727 u[IU]/mL (ref 0.350–4.500)

## 2019-12-27 LAB — GLUCOSE, CAPILLARY: Glucose-Capillary: 93 mg/dL (ref 70–99)

## 2019-12-27 LAB — HIV ANTIBODY (ROUTINE TESTING W REFLEX): HIV Screen 4th Generation wRfx: NONREACTIVE

## 2019-12-27 LAB — AMMONIA: Ammonia: 23 umol/L (ref 9–35)

## 2019-12-27 MED ORDER — DIVALPROEX SODIUM ER 500 MG PO TB24
1500.0000 mg | ORAL_TABLET | Freq: Every day | ORAL | Status: DC
  Administered 2019-12-27 – 2020-01-06 (×11): 1500 mg via ORAL
  Filled 2019-12-27 (×11): qty 3

## 2019-12-27 MED ORDER — INSULIN ASPART 100 UNIT/ML ~~LOC~~ SOLN
0.0000 [IU] | Freq: Three times a day (TID) | SUBCUTANEOUS | Status: DC
  Administered 2019-12-28: 1 [IU] via SUBCUTANEOUS
  Administered 2019-12-29: 2 [IU] via SUBCUTANEOUS
  Administered 2019-12-30: 1 [IU] via SUBCUTANEOUS
  Administered 2019-12-30: 3 [IU] via SUBCUTANEOUS
  Administered 2019-12-30: 2 [IU] via SUBCUTANEOUS
  Administered 2019-12-31: 1 [IU] via SUBCUTANEOUS
  Administered 2019-12-31 – 2020-01-02 (×3): 2 [IU] via SUBCUTANEOUS
  Administered 2020-01-02 – 2020-01-07 (×6): 1 [IU] via SUBCUTANEOUS
  Administered 2020-01-07: 5 [IU] via SUBCUTANEOUS

## 2019-12-27 MED ORDER — QUETIAPINE FUMARATE 50 MG PO TABS
25.0000 mg | ORAL_TABLET | Freq: Every day | ORAL | Status: DC
  Administered 2019-12-27 – 2020-01-06 (×11): 25 mg via ORAL
  Filled 2019-12-27 (×11): qty 1

## 2019-12-27 MED ORDER — ACETAMINOPHEN 325 MG PO TABS
650.0000 mg | ORAL_TABLET | Freq: Four times a day (QID) | ORAL | Status: DC | PRN
  Administered 2019-12-28: 650 mg via ORAL
  Filled 2019-12-27: qty 2

## 2019-12-27 MED ORDER — ACETAMINOPHEN 650 MG RE SUPP: 650.0000 mg | Freq: Four times a day (QID) | RECTAL | Status: DC | PRN

## 2019-12-27 MED ORDER — INSULIN ASPART 100 UNIT/ML ~~LOC~~ SOLN: 0.0000 [IU] | Freq: Every day | SUBCUTANEOUS | Status: DC

## 2019-12-27 MED ORDER — LORAZEPAM 2 MG/ML IJ SOLN
0.5000 mg | Freq: Once | INTRAMUSCULAR | Status: AC | PRN
  Administered 2019-12-27: 0.5 mg via INTRAVENOUS
  Filled 2019-12-27: qty 1

## 2019-12-27 MED ORDER — SODIUM CHLORIDE 0.9 % IV SOLN: INTRAVENOUS | Status: DC

## 2019-12-27 NOTE — Progress Notes (Signed)
Attempted to call patients wife to obtain history with no response.

## 2019-12-27 NOTE — Progress Notes (Signed)
Outstanding failed attempt at MRI today. Patient terminated the procedure and refused further imaging. Patient clearly stated that he is refusing to continue.

## 2019-12-27 NOTE — H&P (Addendum)
History and Physical    [name] FMB:[id number] DOB: [birth year] DOA: 12/26/2019  PCP: [name], MD  Patient coming from: home  I have personally briefly reviewed patient's old medical records in [place]  Chief Complaint: incontinence, saddle anesthesia, encephalopathy  HPI: Andrew Warren is Andrew Warren 69 y.o. male with medical history significant of bipolar disorder, HTN, HLD, and metastatic cancer who presents to the hospital after Kimberlly Norgard clinic visit.    History is obtained from patient and the medical record.  Limited from patient due to encephalopathy.  Wife not at bedside and unable to be reached by phone.  Per oncology note, he was seen by orthopedics outpatient who diagnosed cancer via MRI and he was referred to oncology in early October 10/21.  He has not had any definitive diagnosis with biopsy yet.  He's taking morphine for pain in his back, chest and upper abdomen.  He's noticed recent urinary frequency and incontinence over the past 2 days.  He hasn't had Jerett Odonohue BM in weeks.  He's slower to speak and respond since heing on morphine.  His wife helps him ambulate, use the bathroom over the past week.  Seems like he's been weaker over the apst several weeks.  He notes he's constipated and has issues urinating.  C/o numbness to groin.  He's unable to clearly describe timing.  He complains of pain, but isn't able to describe this well.  No smoking or etoh per his report.     Review of Systems: unable to perform due to mental status  Past Medical History:  Diagnosis Date  . Adenomatous polyps 2004   per H&P in echart by Dr. Arnoldo Morale  . Bipolar disorder ([place])   . BPH (benign prostatic hyperplasia)   . Chronic kidney disease   . Diabetes mellitus   . Genital warts   . High cholesterol   . Rheumatoid arthritis(714.0)   . S/P colonoscopy Jan 2008   diverticulosis in sigmoid, Dr. Arnoldo Morale    Past Surgical History:  Procedure Laterality Date  . APPENDECTOMY    . BIOPSY   03/02/2016   Procedure: BIOPSY;  Surgeon: Daneil Dolin, MD;  [facility];  Service: Endoscopy;;  biopsy evaluate for microscopic colitis  . COLONOSCOPY WITH PROPOFOL N/Dolly Harbach 03/02/2016   Procedure: COLONOSCOPY WITH PROPOFOL;  Surgeon: Daneil Dolin, MD;  Location: [facility];  Service: Endoscopy;  Laterality: N/Kiyoshi Schaab;  10:00am  . GALLBLADDER SURGERY  11/2014  . POLYPECTOMY  03/02/2016   Procedure: POLYPECTOMY;  Surgeon: Daneil Dolin, MD;  Location: [facility];  Service: Endoscopy;;  cecal polyp  . varicocele repair      Social History  reports that he quit smoking about 14 years ago. His smoking use included cigarettes. He has Ikeya Brockel 90.00 pack-year smoking history. He has never used smokeless tobacco. He reports that he does not drink alcohol and does not use drugs.  Allergies  Allergen Reactions  . Penicillins Shortness Of Breath    Has patient had Kadia Abaya PCN reaction causing immediate rash, facial/tongue/throat swelling, SOB or lightheadedness with hypotension: Yes Has patient had Kynnadi Dicenso PCN reaction causing severe rash involving mucus membranes or skin necrosis: No Has patient had Anely Spiewak PCN reaction that required hospitalization No Has patient had Aidenjames Heckmann PCN reaction occurring within the last 10 years: Yes If all of the above answers are "NO", then may proceed with Cephalosporin use.  Shortness of breath/ tightning in throat  . Lithium     Caused  renal failure    Family History  Problem Relation Age of Onset  . Dementia Mother   . Schizophrenia Father   . Diabetes Father   . Heart disease Maternal Grandfather   . Diabetes Maternal Grandfather   . Cancer Maternal Grandfather        brain tumor   . Heart disease Maternal Grandmother   . Diabetes Maternal Grandmother   . Heart disease Paternal Grandfather   . Diabetes Paternal Grandfather   . Heart disease Paternal Grandmother   . Diabetes Paternal Grandmother   . Seizures Sister   . Hypertension Sister   . Heart attack Brother   .  Colon cancer Neg Hx     Prior to Admission medications   Medication Sig Start Date End Date Taking? Authorizing Provider  acetaminophen (TYLENOL) 650 MG CR tablet Take 1,300 mg by mouth every 8 (eight) hours as needed for pain.    [provider]  atorvastatin (LIPITOR) 10 MG tablet Take 1 tablet (10 mg total) by mouth daily at 6 PM. 03/27/14   Kathie Dike, MD  Coenzyme Q10 (CO Q 10 PO) Take 1 capsule by mouth daily.    [provider]  colestipol (COLESTID) 1 g tablet Take 2 tablets (2 g total) by mouth daily. Take before lunch 09/20/19   Laurine Blazer Shanda Cadotte, PA-C  divalproex (DEPAKOTE) 500 MG DR tablet TAKE 3 TABLETS BY MOUTH AT BEDTIME 04/19/19   Cloria Spring, MD  glipiZIDE (GLUCOTROL XL) 10 MG 24 hr tablet Take 10 mg by mouth 2 (two) times daily.    [provider]  hyoscyamine (LEVSIN SL) 0.125 MG SL tablet Place under the tongue 3 (three) times daily. 08/11/19   [provider]  lisinopril (PRINIVIL,ZESTRIL) 5 MG tablet Take 5 mg by mouth daily.    [provider]  QUEtiapine (SEROQUEL) 25 MG tablet TAKE 1 TABLET BY MOUTH AT BEDTIME 01/28/19   Cloria Spring, MD  RYBELSUS 7 MG TABS Take 7 mg by mouth. Patient reports that he takes 1/2 of the 7 mg daily. 02/17/19   [provider]  tamsulosin (FLOMAX) 0.4 MG CAPS capsule Take 0.4 mg by mouth daily.    [provider]  traMADol (ULTRAM) 50 MG tablet Take 50 mg by mouth 2 (two) times daily.    [provider]    Physical Exam: Vitals:   12/23/2019 1642 01/02/2020 1700  BP: 108/76   Pulse: (!) 108   Resp: 17   Temp: 98 F (36.7 C)   TempSrc: Oral   SpO2: 92%   Weight:  92.4 kg  Height:  5\' 8"  (1.727 m)    Constitutional: NAD, calm, comfortable Vitals:   12/11/2019 1642 12/26/2019 1700  BP: 108/76   Pulse: (!) 108   Resp: 17   Temp: 98 F (36.7 C)   TempSrc: Oral   SpO2: 92%   Weight:  92.4 kg  Height:  5\' 8"  (1.727 m)   Eyes: PERRL, lids and conjunctivae  normal ENMT: Mucous membranes are moist. Posterior pharynx clear of any exudate or lesions.Normal dentition.  Neck: normal, supple, no masses, no thyromegaly Respiratory: clear to auscultation bilaterally, no wheezing, no crackles. Normal respiratory effort. No accessory muscle use.  Cardiovascular: Regular rate and rhythm, no murmurs / rubs / gallops. No extremity edema. 2+ pedal pulses. No carotid bruits.  Abdomen: no tenderness, no masses palpated. No hepatosplenomegaly. Bowel sounds positive.  Musculoskeletal: no clubbing / cyanosis. No joint deformity upper and  lower extremities. Good ROM, no contractures. Normal muscle tone.  Skin: no rashes, lesions, ulcers. No induration Neurologic: CN 2-12 intact. C/o subjective numbness to groin.  5/5 strength to upper extremities.  4+/5 to lower extremities bilaterally.  Ellouise Mcwhirter&Ox2.  Labs on Admission: I have personally reviewed following labs and imaging studies  CBC: No results for input(s): WBC, NEUTROABS, HGB, HCT, MCV, PLT in the last 168 hours.  Basic Metabolic Panel: No results for input(s): NA, K, CL, CO2, GLUCOSE, BUN, CREATININE, CALCIUM, MG, PHOS in the last 168 hours.  GFR: CrCl cannot be calculated (Patient's most recent lab result is older than the maximum 21 days allowed.).  Liver Function Tests: No results for input(s): AST, ALT, ALKPHOS, BILITOT, PROT, ALBUMIN in the last 168 hours.  Urine analysis: No results found for: COLORURINE, APPEARANCEUR, LABSPEC, PHURINE, GLUCOSEU, HGBUR, BILIRUBINUR, KETONESUR, PROTEINUR, UROBILINOGEN, NITRITE, LEUKOCYTESUR  Radiological Exams on Admission: No results found.  EKG: Independently reviewed. None, pending  Assessment/Plan Active Problems:   Metastatic cancer ([place])  Saddle Anesthesia  Incontinence  Constipation  Concern for Cord Compression in setting of metastatic disease:  Follow MRI C/T/L spine without contrast given CKD Follow UA and culture   Diffuse Metastatic Cancer with  uncertain primary oncology recommending bx for primary cancer dx - oncology going to follow with GI regarding IR liver bx vs EGD Will make NPO at midnight PET 12/12/19 with widespread metastatic dz including to bones, liver, thoracoabdominal nodes, L adrenal gland.  The primary is presumably an area of distal esophageal wall thickening and hypermetabolism given the clinical hx.  See report.  Acute Metabolic Encephalopathy Per oncology note, this is related to his morphine - slowed speech, slow to respond Kalima Saylor&Ox2 today Follow VBG, ammonia, b12, TSH Follow MRI in setting of his malignancy  T2DM SSI Hold glipizide and rybelsus  Follow A1c  Bipolar: on depakote, seroquel at home, will continue for now, likely needs adjustment with encephalopathy  HLD: holding lipitor for now  HTN: hold lisinopril for now  BPH: holding flomax for now  Follow EKG for QT  Med rec: wife not at bedside during admission - continued depakote/seroquel based on medical record, but doses will need to be verified and other medications will have to be resumed based on med rec.  I was unable to reach Mrs. Winona Legato by phone.  Goals of Care: pt altered, presumed full code as I was unable to reach wife.  Please discuss wife wife or patient when he's improved from mental status standpoint when able.  DVT prophylaxis: SCD Code Status:   ful  Family Communication:  None at bedside, no answer to cell or home phone by wife Disposition Plan:   Patient is from:  home  Anticipated DC to:  pending  Anticipated DC date:  pending  Anticipated DC barriers: Pending bx, imaging studies  Consults called:  oncology  Admission status:  inpatient  Severity of Illness: The appropriate patient status for this patient is INPATIENT. Inpatient status is judged to be reasonable and necessary in order to provide the required intensity of service to ensure the patient's safety. The patient's presenting symptoms, physical exam findings, and  initial radiographic and laboratory data in the context of their chronic comorbidities is felt to place them at high risk for further clinical deterioration. Furthermore, it is not anticipated that the patient will be medically stable for discharge from the hospital within 2 midnights of admission. The following factors support the patient status of inpatient.   " The  patient's presenting symptoms include acute encephalopathy, incontinence. " The worrisome physical exam findings include saddle anesthesia, altered mental status . " The initial radiographic and laboratory data are worrisome because of pending. " The chronic co-morbidities include pending.   * I certify that at the point of admission it is my clinical judgment that the patient will require inpatient hospital care spanning beyond 2 midnights from the point of admission due to high intensity of service, high risk for further deterioration and high frequency of surveillance required.Fayrene Helper MD [facility]  How to contact the [facility] Attending or Consulting provider [place] or covering provider during after hours [place], for this patient?   1. Check the care team in [facility] and look for Stamatia Masri) attending/consulting [facility] provider listed and b) the [facility] team listed 2. Log into [email or web address] and use [place]'s universal password to access. If you do not have the password, please contact the hospital operator. 3. Locate the [facility] provider you are looking for under [facility] and page to Sakina Briones number that you can be directly reached. 4. If you still have difficulty reaching the provider, please page the [facility] (Director on Call) for the Hospitalists listed on amion for assistance.  01/06/2020, 5:50 PM

## 2019-12-27 NOTE — Progress Notes (Signed)
[name]   [phone] Fax:([phone] [name] Note   Patient Care Team: [name], MD as PCP - General (Family Medicine) Gala Romney Cristopher Estimable, MD (Gastroenterology) Jonnie Finner, RN as Oncology Nurse Navigator Truitt Merle, MD as Consulting Physician (Oncology)  Date of Service:  12/14/2019   CHIEF COMPLAINTS/PURPOSE OF CONSULTATION:  Metastatic cancer to bone and liver with unknown primary   REFERRING PHYSICIAN:  Self referral    Oncology History Overview Note  Cancer Staging No matching staging information was found for the patient.    Esophageal cancer, stage IV ([place])  11/24/2019 Tumor Marker   CA 19-9 - 264.18   11/28/2019 Imaging   CT CAP 11/28/19  1. Minimal coronary artery calcifications are noted suggesting  coronary artery disease.  2. 14 mm nodular density is noted in right upper lobe with central  calcification and is most consistent with benign granuloma or  hamartoma. 3 mm nodule is noted anteriorly in the left upper lobe.  Follow-up unenhanced chest CT in 12 months is recommended to ensure  stability and rule out neoplasm.  3. Small scattered calcifications are seen throughout the pancreas  suggesting chronic pancreatitis.  4. Diverticulosis of descending and sigmoid colon is noted without  inflammation.  5. 2.6 cm low density is seen involving anterior portion of midpole  of right kidney most consistent with cyst, but renal ultrasound is  recommended for confirmation and to rule out neoplasm.  Aortic Atherosclerosis (ICD10-I70.0).    12/12/2019 PET scan   PET 12/12/19  IMPRESSION: 1. Widespread metastatic disease, including to bones, liver, thoracoabdominal nodes, left adrenal gland. The primary is presumably an area of distal esophageal wall thickening and hypermetabolism, given the clinical history. 2. Incidental findings, including: Aortic atherosclerosis (ICD10-I70.0), coronary artery  atherosclerosis and emphysema (ICD10-J43.9). Pulmonary artery enlargement suggests pulmonary arterial hypertension.   12/18/2019 Imaging   US Abdomen 12/18/19  1. Multiple hepatic hypoechoic lesions concerning for metastatic  disease. Further characterization with MRI without and with contrast  recommended.  2. Bilateral renal parenchyma atrophy and increased echogenicity,  likely chronic kidney disease. No hydronephrosis or shadowing stone.  3. Echogenic lesion along the posterior mid to lower pole of the  left kidney, indeterminate. Attention on MRI recommended.     12/23/2019 Initial Diagnosis   Esophageal cancer, stage IV ([facility])      HISTORY OF PRESENTING ILLNESS:  Andrew Warren 69 y.o. male is a here because of metastatic cancer. The patient presents to the clinic today accompanied by his wife.  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 He was seen by PCP and Dr Lorin Mercy a Orthopedist who did bone MRI which showed cancer. He was then referred to Oncologist in early 11/2019. He has had a few scan but no biopsy still.  His main pain is in left shoulder from injury. He has back diffusely in his back, chest and upper abdomen. He is on Morphine 7.5mg  every 6-8hours which controls his pain. He is no longer on hydrocodone. He does not sleep much last night but can sleep in the day. He has recent urinary frequency every few hours and incontinence which started in the past 2 days. He has not had BM in weeks. He has tried magnesium citrate, exlax, miralax and enemas without relied.  His wife notes he has been very slow to speak and respond since being on Morphine. He also has consistent cough without phlegm. His wife helps him move, use  the bathroom and ambulate since last week. He has needed help getting out of chairs and bed for the past month. He has been getting weaker over the past 6 weeks. He is able to eat regular foods but notes mild difficulty eating solid food, no vomiting. He also uses  Ensure.   Socially he is married. He does not drink. He quit smoking 15 years ago. He has 2 children himself.  He has a PMHx of bipolar disorder. He has not seen Psychiatrist Dr Harrington Challenger since 03/2019, but virtually. He continues his medication. He has DM, high cholesterol and h/o kidney disease. His MGF had brain cancer.    REVIEW OF SYSTEMS:    Constitutional: Denies fevers, chills or abnormal night sweats Eyes: Denies blurriness of vision, double vision or watery eyes Ears, nose, mouth, throat, and face: Denies mucositis or sore throat Respiratory: Denies cough, dyspnea or wheezes (+) chest pain Cardiovascular: Denies palpitation, chest discomfort or lower extremity swelling Gastrointestinal:  Denies nausea, heartburn or change in bowel habits (+) Upper abdominal pain  Skin: Denies abnormal skin rashes MSK: (+) diffuse back pain (+) Left shoulder pain  Lymphatics: Denies new lymphadenopathy or easy bruising Neurological:Denies numbness, tingling or new weaknesses Behavioral/Psych: Mood is stable, no new changes  All other systems were reviewed with the patient and are negative.   MEDICAL HISTORY:  Past Medical History:  Diagnosis Date  . Adenomatous polyps 2004   per H&P in echart by Dr. Arnoldo Morale  . Bipolar disorder ([place])   . BPH (benign prostatic hyperplasia)   . Chronic kidney disease   . Diabetes mellitus   . Genital warts   . High cholesterol   . Rheumatoid arthritis(714.0)   . S/P colonoscopy Jan 2008   diverticulosis in sigmoid, Dr. Arnoldo Morale    SURGICAL HISTORY: Past Surgical History:  Procedure Laterality Date  . APPENDECTOMY    . BIOPSY  03/02/2016   Procedure: BIOPSY;  Surgeon: Daneil Dolin, MD;  [facility];  Service: Endoscopy;;  biopsy evaluate for microscopic colitis  . COLONOSCOPY WITH PROPOFOL N/A 03/02/2016   Procedure: COLONOSCOPY WITH PROPOFOL;  Surgeon: Daneil Dolin, MD;  Location: [facility];  Service: Endoscopy;  Laterality: N/A;  10:00am    . GALLBLADDER SURGERY  11/2014  . POLYPECTOMY  03/02/2016   Procedure: POLYPECTOMY;  Surgeon: Daneil Dolin, MD;  Location: [facility];  Service: Endoscopy;;  cecal polyp  . varicocele repair      SOCIAL HISTORY: Social History   Socioeconomic History  . Marital status: Married    Spouse name: Not on file  . Number of children: 2  . Years of education: Not on file  . Highest education level: Not on file  Occupational History  . 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 Smoking status: Former Smoker    Packs/day: 3.00    Years: 30.00    Pack years: 90.00    Types: Cigarettes    Quit date: 08/09/2005    Years since quitting: 14.3  . Smokeless tobacco: Never Used  Substance and Sexual Activity  . Alcohol use: No  . Drug use: No  . Sexual activity: Yes  Other Topics Concern  . Not on file  Social History Narrative  . Not on file   Social Determinants of Health   Financial Resource Strain:   . Difficulty of Paying Living Expenses: Not on file  Food Insecurity:   . Worried About Charity fundraiser in  the Last Year: Not on file  . Ran Out of Food in the Last Year: Not on file  Transportation Needs:   . Lack of Transportation (Medical): Not on file  . Lack of Transportation (Non-Medical): Not on file  Physical Activity:   . Days of Exercise per Week: Not on file  . Minutes of Exercise per Session: Not on file  Stress:   . Feeling of Stress : Not on file  Social Connections:   . Frequency of Communication with Friends and Family: Not on file  . Frequency of Social Gatherings with Friends and Family: Not on file  . Attends Religious Services: Not on file  . Active Member of Clubs or Organizations: Not on file  . Attends Archivist Meetings: Not on file  . Marital Status: Not on file  Intimate Partner Violence:   . Fear of Current or Ex-Partner: Not on file  . Emotionally Abused: Not on file  . Physically Abused: Not on file  . Sexually Abused: Not on  file    FAMILY HISTORY: Family History  Problem Relation Age of Onset  . Dementia Mother   . Schizophrenia Father   . Diabetes Father   . Heart disease Maternal Grandfather   . Diabetes Maternal Grandfather   . Cancer Maternal Grandfather        brain tumor   . Heart disease Maternal Grandmother   . Diabetes Maternal Grandmother   . Heart disease Paternal Grandfather   . Diabetes Paternal Grandfather   . Heart disease Paternal Grandmother   . Diabetes Paternal Grandmother   . Seizures Sister   . Hypertension Sister   . Heart attack Brother   . Colon cancer Neg Hx     ALLERGIES:  is allergic to penicillins and lithium.  MEDICATIONS:  No current outpatient medications on file.   No current facility-administered medications for this visit.    PHYSICAL EXAMINATION: ECOG PERFORMANCE STATUS: 3 - Symptomatic, >50% confined to bed  Vitals:   12/16/2019 1505  BP: 124/78  Pulse: (!) 122  Resp: 17  Temp: (!) 97.4 F (36.3 C)  SpO2: 100%   Filed Weights   01/04/2020 1505  Weight: 204 lb 14.4 oz (92.9 kg)    GENERAL:alert, no distress and comfortable SKIN: skin color, texture, turgor are normal, no rashes or significant lesions EYES: normal, Conjunctiva are pink and non-injected, sclera clear  NECK: supple, thyroid normal size, non-tender, without nodularity LYMPH:  no palpable lymphadenopathy in the cervical, axillary  LUNGS: clear to auscultation and percussion with normal breathing effort HEART: regular rate & rhythm and no murmurs and no lower extremity edema ABDOMEN:abdomen soft, non-tender and normal bowel sounds Musculoskeletal:no cyanosis of digits and no clubbing  NEURO: alert & oriented x 3 with fluent speech, no focal motor/sensory deficits  LABORATORY DATA:  I have reviewed the data as listed CBC Latest Ref Rng & Units 02/28/2016 01/20/2016 03/26/2014  WBC 4.0 - 10.5 K/uL 10.5 10.7 7.9  Hemoglobin 13.0 - 17.0 g/dL 14.0 14.0 13.6  Hematocrit 39 - 52 % 45.5  44.7 43.4  Platelets 150 - 400 K/uL 204 253 168    CMP Latest Ref Rng & Units 02/28/2016 03/27/2014 03/26/2014  Glucose 65 - 99 mg/dL 187(H) 140(H) 149(H)  BUN 6 - 20 mg/dL 24(H) 25(H) 24(H)  Creatinine 0.61 - 1.24 mg/dL 1.86(H) 1.77(H) 1.71(H)  Sodium 135 - 145 mmol/L 137 143 141  Potassium 3.5 - 5.1 mmol/L 4.7 4.4 4.8  Chloride 101 -  111 mmol/L 103 110 108  CO2 22 - 32 mmol/L 23 28 30   Calcium 8.9 - 10.3 mg/dL 8.7(L) 8.6 8.9  Total Protein 6.0 - 8.3 g/dL - - 6.4  Total Bilirubin 0.3 - 1.2 mg/dL - - 0.4  Alkaline Phos 39 - 117 U/L - - 55  AST 0 - 37 U/L - - 17  ALT 0 - 53 U/L - - 14     RADIOGRAPHIC STUDIES: I have personally reviewed the radiological images as listed and agreed with the findings in the report. NM PET Image Initial (PI) Skull Base To Thigh  Result Date: 12/12/2019 CLINICAL DATA:  Initial treatment strategy for neoplasm of esophagus. Bone metastasis. COVID vaccine in left arm 5/21 EXAM: NUCLEAR MEDICINE PET SKULL BASE TO THIGH TECHNIQUE: 11.1 mCi F-18 FDG was injected intravenously. Full-ring PET imaging was performed from the skull base to thigh after the radiotracer. CT data was obtained and used for attenuation correction and anatomic localization. Fasting blood glucose: 89 mg/dl COMPARISON:  11/20/2019 chest abdomen and pelvic CTs. FINDINGS: Mediastinal blood pool activity: SUV max 3.3 Liver activity: SUV max NA NECK: Laryngeal hypermetabolism is favored to be physiologic, without well-defined CT correlate. No cervical nodal hypermetabolism. Incidental CT findings: No cervical adenopathy. CHEST: Small hypermetabolic mediastinal nodes, including a prevascular node of 7 mm and a S.U.V. max of 12.4 on 63/4. Distal esophageal hypermetabolism with mild wall thickening, including at a S.U.V. max of 18.6 on 97/4. Incidental CT findings: Aortic and coronary artery atherosclerosis. Pulmonary artery enlargement, outflow tract 3.3 cm. Centrilobular and paraseptal emphysema. Central  right upper lobe 1.3 cm pulmonary nodule is again favored to represent a hamartoma or granuloma, given central calcification. Not hypermetabolic. ABDOMEN/PELVIS: Bilateral hepatic metastasis which are relatively CT occult. Example in the anterior left hepatic lobe at a S.U.V. max of 13.3 and within the central high right hepatic lobe at a S.U.V. max of 12.9. Abdominal nodal metastasis, with index portacaval node measuring 1.1 cm and a S.U.V. max of 15.9 on 112/4. Multifocal right-sided colonic hypermetabolism is favored to be physiologic. Left adrenal hypermetabolic nodule measures 1.1 cm and a S.U.V. max of 15.0 on 108/4 Incidental CT findings: Extensive colonic diverticulosis. Cholecystectomy. Abdominal aortic atherosclerosis. Nonaneurysmal dilatation at 2.8 cm. SKELETON: Widespread osseous metastasis. The previously described left scapular/coracoid lesion measures 4.5 cm and a S.U.V. max of 21.0 on 37/4. A sacral lytic lesion measures a S.U.V. max of 16.7. Incidental CT findings: none IMPRESSION: 1. Widespread metastatic disease, including to bones, liver, thoracoabdominal nodes, left adrenal gland. The primary is presumably an area of distal esophageal wall thickening and hypermetabolism, given the clinical history. 2. Incidental findings, including: Aortic atherosclerosis (ICD10-I70.0), coronary artery atherosclerosis and emphysema (ICD10-J43.9). Pulmonary artery enlargement suggests pulmonary arterial hypertension. Electronically Signed   By: Abigail Miyamoto M.D.   On: 12/12/2019 09:20    ASSESSMENT & PLAN:  Andrew Warren is a 69 y.o. Caucasian male with a history of Bipolar disorder, benign prostate hyperplasia, DM, CKD, high cholesterol, RA, Genetical warts.   1. Diffuse Metastatic Cancer to bones, liver, nodes and left adrenal gland, uncertain primary, ? Esophageal cancer  -he presented with severe left shoulder pain after injury in late Sep 2021. He has developed worsening back pain, fatigue,  anorexia and weight loss since then and rapid declining in past few weeks.  -I have personally reviewed his PET image from 12/12/19 with patient and his wife, which shows widespread metastatic disease, including to bones (b/l shoulder, spine and pelvis),  liver, thoracoabdominal nodes, left adrenal gland. Scan also shows distal esophageal wall thickening with some mild uptake concerning for esophageal primary, but not very impressive to me.  -He has developed urinary frequency and incontinence in the past few days.  Given the known diffuse spinal metastasis, need to rule out spinal cord compression.  I recommend direct hospital admission. -I recommend Biopsy for definitive primary cancer diagnosis and likely more scans, such as spinal MRI given new onset urinary frequency/incontinence. They are agreeable.  -I will follow him in hospital.    2. Symptom Management: Diffuse pain, Constipation, Urinary frequency/incontinence, Weakness/Fatigue, Cough -Since Left shoulder injury in Fall 2021 he has had pain diffusely in back, chest, upper abdomen and mainly in left shoulder from his bone metastasis.  -His pain is currently controlled on Morphine 15mg  (half tablets) every 6-8 hours. He is no longer on hydrocodone.  -His wife notes he has been very slow to speak and respond since being on Morphine as well as occasional confusion.  -He has not had a BM in 2 weeks. He has tried magnesium citrate, exlax, miralax and enemas without relief.  -In the past 2 days he has recent urinary frequency every few hours and incontinence which started in the past 2 days. I am concerned this is from nerve compression from his bone mets. I recommend MRI spine for further evaluation. He is agreeable.  -He also has consistent cough without phlegm in the recent weeks. His lung nodules on scan do not appear to be related to his cancer.  -He is has severely decreased mobile and needs assistance moving including walker. He is very weak  and fatigued, mainly in the recent 1-2 weeks.  -Will admit to [facility] for more work up and symptom management.    3. Comorbidities: Kidney disease, Bipolar Disorder, benign prostate hyperplasia, DM, CKD, high cholesterol, Rheumatoid Arthritis  -Due to prior use of Lithium he had severe kidney disease. Has much improved now.  -His DM is controlled, BG has been low lately, and rybelsus has been held  -His bipolar Disorder is controlled on medication and manage by Psychiatrist Dr Harrington Challenger.  -Continue medications and f/u with other physicians.    PLAN:  -Will admit to [facility] hospital today for further workup and rule out cord compression. I recommend total spinal MRI wo contrast (due to his CKD) -IR liver biopsy vs EGD, I will talk to GI  -symptom management  -I will follow him in hospital.    No orders of the defined types were placed in this encounter.   All questions were answered. The patient knows to call the clinic with any problems, questions or concerns. The total time spent in the appointment was 60 minutes.     Truitt Merle, MD 12/24/2019 4:52 PM  I, Joslyn Devon, am acting as scribe for Truitt Merle, MD.   I have reviewed the above documentation for accuracy and completeness, and I agree with the above.

## 2019-12-28 ENCOUNTER — Inpatient Hospital Stay (HOSPITAL_COMMUNITY): Payer: Medicare Other

## 2019-12-28 ENCOUNTER — Inpatient Hospital Stay: Payer: Self-pay

## 2019-12-28 DIAGNOSIS — R933 Abnormal findings on diagnostic imaging of other parts of digestive tract: Secondary | ICD-10-CM

## 2019-12-28 DIAGNOSIS — C799 Secondary malignant neoplasm of unspecified site: Secondary | ICD-10-CM | POA: Diagnosis not present

## 2019-12-28 LAB — GLUCOSE, CAPILLARY
Glucose-Capillary: 61 mg/dL — ABNORMAL LOW (ref 70–99)
Glucose-Capillary: 96 mg/dL (ref 70–99)
Glucose-Capillary: 135 mg/dL — ABNORMAL HIGH (ref 70–99)
Glucose-Capillary: 109 mg/dL — ABNORMAL HIGH (ref 70–99)
Glucose-Capillary: 157 mg/dL — ABNORMAL HIGH (ref 70–99)

## 2019-12-28 LAB — COMPREHENSIVE METABOLIC PANEL
ALT: 12 U/L (ref 0–44)
AST: 22 U/L (ref 15–41)
Albumin: 2.9 g/dL — ABNORMAL LOW (ref 3.5–5.0)
Alkaline Phosphatase: 88 U/L (ref 38–126)
Anion gap: 11 (ref 5–15)
BUN: 45 mg/dL — ABNORMAL HIGH (ref 8–23)
CO2: 26 mmol/L (ref 22–32)
Calcium: 10.5 mg/dL — ABNORMAL HIGH (ref 8.9–10.3)
Chloride: 103 mmol/L (ref 98–111)
Creatinine, Ser: 1.77 mg/dL — ABNORMAL HIGH (ref 0.61–1.24)
GFR, Estimated: 41 mL/min — ABNORMAL LOW (ref >60)
Glucose, Bld: 96 mg/dL (ref 70–99)
Potassium: 5.1 mmol/L (ref 3.5–5.1)
Sodium: 140 mmol/L (ref 135–145)
Total Bilirubin: 0.4 mg/dL (ref 0.3–1.2)
Total Protein: 6.3 g/dL — ABNORMAL LOW (ref 6.5–8.1)

## 2019-12-28 LAB — CBC
HCT: 38.5 % — ABNORMAL LOW (ref 39.0–52.0)
Hemoglobin: 11.7 g/dL — ABNORMAL LOW (ref 13.0–17.0)
MCH: 27.3 pg (ref 26.0–34.0)
MCHC: 30.4 g/dL (ref 30.0–36.0)
MCV: 89.7 fL (ref 80.0–100.0)
Platelets: 224 10*3/uL (ref 150–400)
RBC: 4.29 MIL/uL (ref 4.22–5.81)
RDW: 14.6 % (ref 11.5–15.5)
WBC: 12 10*3/uL — ABNORMAL HIGH (ref 4.0–10.5)
nRBC: 0 % (ref 0.0–0.2)

## 2019-12-28 MED ORDER — MORPHINE SULFATE 15 MG PO TABS
7.5000 mg | ORAL_TABLET | ORAL | Status: DC | PRN
  Administered 2019-12-28 – 2020-01-02 (×19): 7.5 mg via ORAL
  Filled 2019-12-28 (×20): qty 1

## 2019-12-28 MED ORDER — TAMSULOSIN HCL 0.4 MG PO CAPS
0.4000 mg | ORAL_CAPSULE | Freq: Every day | ORAL | Status: DC
  Administered 2019-12-28 – 2020-01-07 (×10): 0.4 mg via ORAL
  Filled 2019-12-28 (×11): qty 1

## 2019-12-28 MED ORDER — LORAZEPAM 2 MG/ML IJ SOLN: 1.5000 mg | Freq: Once | INTRAMUSCULAR | Status: DC | PRN

## 2019-12-28 MED ORDER — ADULT MULTIVITAMIN W/MINERALS CH
1.0000 | ORAL_TABLET | Freq: Every day | ORAL | Status: DC
  Filled 2019-12-28: qty 1

## 2019-12-28 MED ORDER — DEXTROSE-NACL 5-0.9 % IV SOLN: INTRAVENOUS | Status: DC

## 2019-12-28 MED ORDER — ATORVASTATIN CALCIUM 10 MG PO TABS
10.0000 mg | ORAL_TABLET | Freq: Every day | ORAL | Status: DC
  Administered 2019-12-28 – 2020-01-06 (×10): 10 mg via ORAL
  Filled 2019-12-28 (×10): qty 1

## 2019-12-28 MED ORDER — ENSURE ENLIVE PO LIQD
237.0000 mL | Freq: Two times a day (BID) | ORAL | Status: DC
  Administered 2019-12-28 – 2020-01-07 (×13): 237 mL via ORAL

## 2019-12-28 MED ORDER — LORAZEPAM 2 MG/ML IJ SOLN: 0.5000 mg | Freq: Once | INTRAMUSCULAR | Status: DC | PRN

## 2019-12-28 MED ORDER — DEXTROSE IN LACTATED RINGERS 5 % IV SOLN: INTRAVENOUS | Status: DC

## 2019-12-28 MED ORDER — LORAZEPAM 2 MG/ML IJ SOLN
1.0000 mg | Freq: Once | INTRAMUSCULAR | Status: AC | PRN
  Administered 2019-12-29: 1 mg via INTRAVENOUS
  Filled 2019-12-28: qty 1

## 2019-12-28 NOTE — Consult Note (Signed)
Chief Complaint: Patient was seen in consultation today for liver mass/biopsy.  Referring Physician(s): Maryanna Shape (oncology)  Supervising Physician: Arne Cleveland  Patient Status: [facility] - In-pt  History of Present Illness: Andrew Warren is a 69 y.o. male with a past medical history of high cholesterol, CKD, BPH, diabetes mellitus, bipolar disorder, and RA. Of note, patient was seen by orthopedics a few months ago and was diagnosed with cancer via MR (bone lesions, lymphadenopathy, liver mass, left adrenal gland met on MR- not biopsy proven at this time). He was referred to oncology for further management and met with Dr. Burr Medico 12/11/2019. At that time, patient had new onset urinary incontinence, constipation, diffuse pain, generalized weakness, and confusion- therefore he was directly admitted to [facility] that day. He was seen by oncology inpatient who recommends radiation oncology for bone lesions, along with IR consult for possible liver mass biopsy.  IR consulted by Mikey Bussing, NP for possible image-guided liver mass biopsy. Patient awake and alert laying in bed, however obviously confused (making statements not related to conversation). History difficult to obtain secondary to acute encephalopathy. Wife at bedside.   Past Medical History:  Diagnosis Date  . Adenomatous polyps 2004   per H&P in echart by Dr. Arnoldo Morale  . Bipolar disorder ([place])   . BPH (benign prostatic hyperplasia)   . Chronic kidney disease   . Diabetes mellitus   . Genital warts   . High cholesterol   . Rheumatoid arthritis(714.0)   . S/P colonoscopy Jan 2008   diverticulosis in sigmoid, Dr. Arnoldo Morale    Past Surgical History:  Procedure Laterality Date  . APPENDECTOMY    . BIOPSY  03/02/2016   Procedure: BIOPSY;  Surgeon: Daneil Dolin, MD;  Location: [facility];  Service: Endoscopy;;  biopsy evaluate for microscopic colitis  . COLONOSCOPY WITH PROPOFOL N/A 03/02/2016   Procedure:  COLONOSCOPY WITH PROPOFOL;  Surgeon: Daneil Dolin, MD;  Location: [facility];  Service: Endoscopy;  Laterality: N/A;  10:00am  . GALLBLADDER SURGERY  11/2014  . POLYPECTOMY  03/02/2016   Procedure: POLYPECTOMY;  Surgeon: Daneil Dolin, MD;  Location: [facility];  Service: Endoscopy;;  cecal polyp  . varicocele repair      Allergies: Penicillins and Lithium  Medications: Prior to Admission medications   Medication Sig Start Date End Date Taking? Authorizing Provider  acetaminophen (TYLENOL) 650 MG CR tablet Take 1,300 mg by mouth every 8 (eight) hours as needed for pain.   Yes [provider]  atorvastatin (LIPITOR) 10 MG tablet Take 1 tablet (10 mg total) by mouth daily at 6 PM. 03/27/14  Yes Memon, Jolaine Artist, MD  colestipol (COLESTID) 1 g tablet Take 2 tablets (2 g total) by mouth daily. Take before lunch 09/20/19  Yes Laurine Blazer A, PA-C  divalproex (DEPAKOTE) 500 MG DR tablet TAKE 3 TABLETS BY MOUTH AT BEDTIME Patient taking differently: Take 1,500 mg by mouth at bedtime.  04/19/19  Yes Cloria Spring, MD  glipiZIDE (GLUCOTROL XL) 10 MG 24 hr tablet Take 10 mg by mouth 2 (two) times daily.   Yes [provider]  lisinopril (PRINIVIL,ZESTRIL) 5 MG tablet Take 5 mg by mouth daily.   Yes [provider]  morphine (MSIR) 15 MG tablet Take 7.5 mg by mouth every 4 (four) hours as needed for moderate pain or severe pain.  12/14/19 01/13/20 Yes [provider]  QUEtiapine (SEROQUEL) 25 MG tablet TAKE 1 TABLET BY MOUTH AT BEDTIME  Patient taking differently: Take 25 mg by mouth at bedtime.  01/28/19  Yes Cloria Spring, MD  tamsulosin (FLOMAX) 0.4 MG CAPS capsule Take 0.4 mg by mouth daily.   Yes [provider]     Family History  Problem Relation Age of Onset  . Dementia Mother   . Schizophrenia Father   . Diabetes Father   . Heart disease Maternal Grandfather   . Diabetes Maternal Grandfather   . Cancer Maternal Grandfather         brain tumor   . Heart disease Maternal Grandmother   . Diabetes Maternal Grandmother   . Heart disease Paternal Grandfather   . Diabetes Paternal Grandfather   . Heart disease Paternal Grandmother   . Diabetes Paternal Grandmother   . Seizures Sister   . Hypertension Sister   . Heart attack Brother   . Colon cancer Neg Hx     Social History   Socioeconomic History  . Marital status: Married    Spouse name: Not on file  . Number of children: 2  . Years of education: Not on file  . Highest education level: Not on file  Occupational History  . 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 Smoking status: Former Smoker    Packs/day: 3.00    Years: 30.00    Pack years: 90.00    Types: Cigarettes    Quit date: 08/09/2005    Years since quitting: 14.3  . Smokeless tobacco: Never Used  Substance and Sexual Activity  . Alcohol use: No  . Drug use: No  . Sexual activity: Yes  Other Topics Concern  . Not on file  Social History Narrative  . Not on file   Social Determinants of Health   Financial Resource Strain:   . Difficulty of Paying Living Expenses: Not on file  Food Insecurity:   . Worried About Charity fundraiser in the Last Year: Not on file  . Ran Out of Food in the Last Year: Not on file  Transportation Needs:   . Lack of Transportation (Medical): Not on file  . Lack of Transportation (Non-Medical): Not on file  Physical Activity:   . Days of Exercise per Week: Not on file  . Minutes of Exercise per Session: Not on file  Stress:   . Feeling of Stress : Not on file  Social Connections:   . Frequency of Communication with Friends and Family: Not on file  . Frequency of Social Gatherings with Friends and Family: Not on file  . Attends Religious Services: Not on file  . Active Member of Clubs or Organizations: Not on file  . Attends Archivist Meetings: Not on file  . Marital Status: Not on file     Review of Systems: A 12 point ROS discussed and  pertinent positives are indicated in the HPI above.  All other systems are negative.  Review of Systems  Unable to perform ROS: Mental status change    Vital Signs: BP 122/70 (BP Location: Left Arm)   Pulse (!) 110   Temp 98.6 F (37 C) (Oral)   Resp 20   Ht _0  (1.727 m)   Wt 206 lb 2.1 oz (93.5 kg)   SpO2 99%   BMI 31.34 kg/m   Physical Exam Vitals and nursing note reviewed.  Constitutional:      General: He is not in acute distress. Cardiovascular:     Rate and Rhythm: Normal rate and regular  rhythm.     Heart sounds: Normal heart sounds. No murmur heard.   Pulmonary:     Effort: Pulmonary effort is normal. No respiratory distress.     Breath sounds: Normal breath sounds. No wheezing.  Skin:    General: Skin is warm and dry.  Neurological:     Mental Status: He is alert.      MD Evaluation Airway: WNL Heart: WNL Abdomen: WNL Chest/ Lungs: WNL ASA  Classification: 3 Mallampati/Airway Score: Two   Imaging: MR CERVICAL SPINE WO CONTRAST  Result Date: 01/06/2020 CLINICAL DATA:  Initial evaluation for concern for metastatic disease, incontinence, back pain. EXAM: MRI CERVICAL SPINE WITHOUT CONTRAST TECHNIQUE: Multiplanar, multisequence MR imaging of the cervical spine was performed. No intravenous contrast was administered. COMPARISON:  Prior PET-CT from 12/12/2019 FINDINGS: Alignment: Examination moderately degraded by motion artifact. Straightening of the normal cervical lordosis.  No listhesis. Vertebrae: Scattered osseous metastatic disease seen involving the cervical spine, with definite involvement of the C7, T1, T2, and T3 vertebral bodies. Abnormal signal intensity involving the C5 and C6 vertebral bodies could be related to metastatic disease or possibly reactive degenerative change. Metastatic lesions noted involving the occipital condyles bilaterally. Abnormal edema centered about the right C7-T1 facet likely reflects osseous metastatic disease as well  (series 11, image 4). Probable extraosseous extension into the adjacent soft tissues at this level (series 12, image 29) no other definite or convincing extraosseous extension seen on this motion degraded exam. No appreciable epidural involvement. Vertebral body height maintained without pathologic fracture. Cord: Signal intensity within the cervical spinal cord is grossly within normal limits. No convincing cord signal abnormality on this motion degraded exam. Posterior Fossa, vertebral arteries, paraspinal tissues: Retro cerebellar cyst versus mega cisterna magna noted at the visualized posterior fossa. Craniocervical junction within normal limits. Paraspinous soft tissues otherwise unremarkable. Normal flow voids seen within the vertebral arteries bilaterally. Disc levels: C2-C3: Negative interspace. Moderate right with mild left facet degeneration. No significant canal or foraminal stenosis. C3-C4: Minimal disc bulge. Mild to moderate right with mild left facet hypertrophy. No significant spinal stenosis. Foramina remain patent. C4-C5: Shallow central disc osteophyte complex indents the ventral thecal sac. Mild spinal stenosis without significant cord deformity. Foramina remain patent. C5-C6: Broad-based posterior disc osteophyte complex flattens and indents the ventral thecal sac, slightly eccentric to the left. Resultant mild spinal stenosis with mild flattening of the left hemi cord. No definite cord signal changes. Superimposed right greater than left uncovertebral hypertrophy with no more than mild bilateral C6 foraminal narrowing. C6-C7: Degenerative intervertebral disc space narrowing with diffuse disc osteophyte. Mild flattening of the ventral thecal sac without significant spinal stenosis. Right greater than left uncovertebral spurring with resultant moderate right C7 foraminal stenosis. No significant left foraminal narrowing. C7-T1: Minimal disc bulge. Osseous metastatic disease involves the right  C7-T1 facet. Probable involvement of the adjacent right T1 transverse process and posterior right first rib (series 12, images 29, 30). No significant epidural extension at this time. Spinal canal remains patent. Neural foramina remain grossly patent as well. IMPRESSION: 1. Scattered osseous metastatic disease involving the cervical spine as above. No significant epidural extension at this time. No pathologic fracture or other complication. 2. Metastatic deposit centered about the right C7-T1 facet with associated extraosseous extension into the surrounding soft tissues as above. No other significant extra osseous disease identified on this motion degraded exam. 3. Moderate multilevel cervical spondylosis as above, with resultant mild spinal stenosis at C4-5 through C6-7. Moderate right C7  foraminal narrowing related to disc osteophyte and uncovertebral disease. Electronically Signed   By: Jeannine Boga M.D.   On: 12/31/2019 21:59   MR LUMBAR SPINE WO CONTRAST  Result Date: 12/18/2019 CLINICAL DATA:  Initial evaluation for back pain, incontinence, metastatic disease evaluation. EXAM: MRI LUMBAR SPINE WITHOUT CONTRAST TECHNIQUE: Multiplanar, multisequence MR imaging of the lumbar spine was performed. No intravenous contrast was administered. COMPARISON:  Prior PET-CT from 12/12/2019 FINDINGS: Segmentation: Standard. Lowest well-formed disc space labeled the L5-S1 level. Alignment: 2 mm retrolisthesis of L2 on L3. Alignment otherwise normal with preservation of the normal lumbar lordosis. Vertebrae: Extensive diffuse osseous metastases seen throughout the visualized thoracolumbar spine as well as the sacrum and pelvis. There is involvement of the centrally all levels. For reference purposes, largest of these lesions involves the left anterior aspect of the L2 vertebral body and measures approximately 3.2 cm in greatest dimension (series 11, image 11). Vertebral body height maintained without pathologic  fracture. There is early extraosseous extension at the level of the sacrum/S1 segment, with tumor encroaching upon the descending S1 nerve roots bilaterally, right greater than left (series 13, image 34). Slight mass effect on the descending right S1 nerve root which is somewhat flattened and displaced posteriorly. No overt neural impingement. No other significant extra osseous extension of tumor seen elsewhere within the lumbar spine at this time. No other significant epidural extension. Conus medullaris and cauda equina: Conus extends to the T12-L1 level. Conus and cauda equina appear normal. No evidence for compression of the distal cord or nerve roots of the cauda equina. Paraspinal and other soft tissues: Left periaortic/retroperitoneal lymph nodes measuring up to 9 mm noted (series 12, image 5), nonspecific, but could reflect nodal metastases. Atherosclerotic change noted within the visualized aorta. Multiple scattered cystic lesions noted about the kidneys, several of which demonstrate intrinsic T1 hyperintensity within the right kidney (series 13, image 10). Findings are indeterminate, and could reflect proteinaceous and/or hemorrhagic cyst. These are not well assessed on this noncontrast examination. Paraspinous soft tissues demonstrate no other acute finding. Disc levels: T12-L1: Mild diffuse disc bulge with disc desiccation. Chronic endplate Schmorl's node deformities. No significant spinal stenosis. Foramina remain patent. L1-2: Mild disc bulge with disc desiccation. Chronic endplate Schmorl's node deformity. Mild facet hypertrophy. No spinal stenosis. Foramina remain patent. L2-3: Trace retrolisthesis. Degenerative intervertebral disc space narrowing with mild diffuse disc bulge and disc desiccation. Mild left greater than right facet hypertrophy. Resultant mild narrowing of the lateral recesses bilaterally, left greater than right. Central canal remains patent. No significant foraminal encroachment.  L3-4: Disc desiccation with mild disc bulge. Central annular fissure. Mild bilateral facet hypertrophy. Resultant mild lateral recess narrowing bilaterally. Central canal remains patent. No significant foraminal encroachment. L4-5: Disc desiccation with mild diffuse disc bulge. Central annular fissure. Mild facet and ligament flavum hypertrophy. Resultant mild narrowing of the right lateral recess. Foramina remain patent. L5-S1: Disc desiccation without significant disc bulge. Severe right with moderate left facet degeneration. Mild epidural lipomatosis. No significant canal or foraminal stenosis. IMPRESSION: 1. Extensive diffuse osseous metastases throughout the visualized thoracolumbar spine and pelvis. No pathologic fracture. 2. Early extraosseous extension of tumor at the level of the sacrum/S1 segment, with tumor encroaching upon the descending S1 nerve roots bilaterally, right greater than left. Slight mass effect on the descending right S1 nerve root without overt neural impingement. 3. No other significant extra osseous extension or epidural tumor elsewhere within the lumbar spine. No cord or cauda equina compression. 4. Left periaortic/retroperitoneal  lymph nodes measuring up to 9 mm, nonspecific, but could reflect nodal metastases. 5. Mild multilevel degenerative spondylosis with resultant mild lateral recess narrowing bilaterally at L2-3 and L3-4, and on the right at L4-5. Electronically Signed   By: Jeannine Boga M.D.   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 Bone metastasis. COVID vaccine in left arm 5/21 EXAM: NUCLEAR MEDICINE PET SKULL BASE TO THIGH TECHNIQUE: 11.1 mCi F-18 FDG was injected intravenously. Full-ring PET imaging was performed from the skull base to thigh after the radiotracer. CT data was obtained and used for attenuation correction and anatomic  localization. Fasting blood glucose: 89 mg/dl COMPARISON:  11/20/2019 chest abdomen and pelvic CTs. FINDINGS: Mediastinal blood pool activity: SUV max 3.3 Liver activity: SUV max NA NECK: Laryngeal hypermetabolism is favored to be physiologic, without well-defined CT correlate. No cervical nodal hypermetabolism. Incidental CT findings: No cervical adenopathy. CHEST: Small hypermetabolic mediastinal nodes, including a prevascular node of 7 mm and a S.U.V. max of 12.4 on 63/4. Distal esophageal hypermetabolism with mild wall thickening, including at a S.U.V. max of 18.6 on 97/4. Incidental CT findings: Aortic and coronary artery atherosclerosis. Pulmonary artery enlargement, outflow tract 3.3 cm. Centrilobular and paraseptal emphysema. Central right upper lobe 1.3 cm pulmonary nodule is again favored to represent a hamartoma or granuloma, given central calcification. Not hypermetabolic. ABDOMEN/PELVIS: Bilateral hepatic metastasis which are relatively CT occult. Example in the anterior left hepatic lobe at a S.U.V. max of 13.3 and within the central high right hepatic lobe at a S.U.V. max of 12.9. Abdominal nodal metastasis, with index portacaval node measuring 1.1 cm and a S.U.V. max of 15.9 on 112/4. Multifocal right-sided colonic hypermetabolism is favored to be physiologic. Left adrenal hypermetabolic nodule measures 1.1 cm and a S.U.V. max of 15.0 on 108/4 Incidental CT findings: Extensive colonic diverticulosis. Cholecystectomy. Abdominal aortic atherosclerosis. Nonaneurysmal dilatation at 2.8 cm. SKELETON: Widespread osseous metastasis. The previously described left scapular/coracoid lesion measures 4.5 cm and a S.U.V. max of 21.0 on 37/4. A sacral lytic lesion measures a S.U.V. max of 16.7. Incidental CT findings: none IMPRESSION: 1. Widespread metastatic disease, including to bones, liver, thoracoabdominal nodes, left adrenal gland. The primary is presumably an area of distal esophageal wall thickening and  hypermetabolism, given the clinical history. 2. Incidental findings, including: Aortic atherosclerosis (ICD10-I70.0), coronary artery atherosclerosis and emphysema (ICD10-J43.9). Pulmonary artery enlargement suggests pulmonary arterial hypertension. Electronically Signed   By: Abigail Miyamoto M.D.   On: 12/12/2019 09:20   DG CHEST PORT 1 VIEW  Result Date: 01/02/2020 CLINICAL DATA:  Metastatic cancer EXAM: PORTABLE CHEST 1 VIEW COMPARISON:  03/26/2014 FINDINGS: Calcified granuloma in the right mid lung. Heart is normal size. No confluent opacities or effusions. No acute bony abnormality. IMPRESSION: No active disease. Electronically Signed   By: Rolm Baptise M.D.   On: 12/15/2019 20:25    Labs:  CBC: Recent Labs    12/25/2019 1748 12/28/19 0555  WBC 13.1* 12.0*  HGB 11.9* 11.7*  HCT 38.7* 38.5*  PLT 256 224    COAGS: No results for input(s): INR, APTT in the last 8760 hours.  BMP: Recent Labs    12/19/2019 1748 12/28/19 0555  NA 137 140  K 4.8 5.1  CL 101 103  CO2 25 26  GLUCOSE 92 96  BUN 51* 45*  CALCIUM 10.9* 10.5*  CREATININE 1.89* 1.77*  GFRNONAA 38* 41*  LIVER FUNCTION TESTS: Recent Labs    12/18/2019 1748 12/28/19 0555  BILITOT 0.5 0.4  AST 19 22  ALT 12 12  ALKPHOS 100 88  PROT 6.9 6.3*  ALBUMIN 3.3* 2.9*     Assessment and Plan:  Diffuse metastatic cancer (bones, liver, lymph nodes, left adrenal gland) without known primary. Plan for image-guided liver mass biopsy in IR tentatively for tomorrow 12/15/2019 pending IR scheduling. Patient will be NPO at midnight. Afebrile. He does not take blood thinners. INR pending.  Risks and benefits discussed with the patient including, but not limited to bleeding, infection, damage to adjacent structures or low yield requiring additional tests. All of the patient's wife's questions were answered, patient is agreeable to proceed. Consent obtained by patient's wife, Nicanor Mendolia, secondary to patient's AMS-  signed and in chart.   Thank you for this interesting consult.  I greatly enjoyed meeting BASHAR MILAM and look forward to participating in their care.  A copy of this report was sent to the requesting provider on this date.  Electronically Signed: Earley Abide, PA-C 12/28/2019, 1:28 PM   I spent a total of 40 Minutes in face to face in clinical consultation, greater than 50% of which was counseling/coordinating care for liver mass/biopsy.

## 2019-12-28 NOTE — Evaluation (Signed)
SLP Cancellation Note  Patient Details Name: AZARYAH OLEKSY MRN: [id number] DOB: 1951/01/02   Cancelled treatment:       Reason Eval/Treat Not Completed: Other (comment) (order for swallow eval received, pt has esophageal cancer, note potential GI referal for endoscopy, will follow up)  Kathleen Lime, MS [place] Office [phone] Pager [phone]   Macario Golds 12/28/2019, 10:25 AM
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
## 2019-12-28 NOTE — Progress Notes (Signed)
Initial Nutrition Assessment  DOCUMENTATION CODES:   Obesity unspecified  INTERVENTION:   -Ensure Enlive po BID, each supplement provides 350 kcal and 20 grams of protein -Multivitamin with minerals daily  NUTRITION DIAGNOSIS:   Increased nutrient needs related to cancer and cancer related treatments as evidenced by estimated needs.  GOAL:   Patient will meet greater than or equal to 90% of their needs  MONITOR:   PO intake, Supplement acceptance, Labs, Weight trends, I & O's  REASON FOR ASSESSMENT:   Malnutrition Screening Tool    ASSESSMENT:   69 y.o. male with medical history significant of bipolar disorder, HTN, HLD, and metastatic cancer who presents to the hospital after a clinic visit.  Patient currently alert/oriented x 2. Unable to gather information at this time.   Per chart review, pt with esophageal wall thickening related to esophageal cancer. May have possible EGD. GI to see. Pt has had some difficulty consuming solid foods and drinks Ensure at home. Will order.  Per weight records, pt has lost 18 lbs since 8/11 (8% wt loss x 3 months, significant for time frame).   Medications: D5 infusion   Labs reviewed: CBGs: 61-96  NUTRITION - FOCUSED PHYSICAL EXAM:  Deferred  Diet Order:   Diet Order            Diet regular Room service appropriate? Yes; Fluid consistency: Thin  Diet effective now                 EDUCATION NEEDS:   No education needs have been identified at this time  Skin:  Skin Assessment: Reviewed RN Assessment  Last BM:  PTA  Height:   Ht Readings from Last 1 Encounters:  01/06/2020 5\' 8"  (1.727 m)    Weight:   Wt Readings from Last 1 Encounters:  12/28/19 93.5 kg   BMI:  Body mass index is 31.34 kg/m.  Estimated Nutritional Needs:   Kcal:  2100-2300  Protein:  100-115g  Fluid:  2L/day  Clayton Bibles, MS, RD, LDN Inpatient Clinical Dietitian Contact information available via Amion

## 2019-12-28 NOTE — Progress Notes (Addendum)
HEMATOLOGY-ONCOLOGY PROGRESS NOTE  SUBJECTIVE: The patient was seen this morning.  Has no specific complaints.  No family at the bedside.  Somewhat somnolent.  Reported back pain but could not give further details.  Oncology History Overview Note  Cancer Staging No matching staging information was found for the patient.    Esophageal cancer, stage IV ([place])  11/24/2019 Tumor Marker   CA 19-9 - 264.18   11/28/2019 Imaging   CT CAP 11/28/19  1. Minimal coronary artery calcifications are noted suggesting  coronary artery disease.  2. 14 mm nodular density is noted in right upper lobe with central  calcification and is most consistent with benign granuloma or  hamartoma. 3 mm nodule is noted anteriorly in the left upper lobe.  Follow-up unenhanced chest CT in 12 months is recommended to ensure  stability and rule out neoplasm.  3. Small scattered calcifications are seen throughout the pancreas  suggesting chronic pancreatitis.  4. Diverticulosis of descending and sigmoid colon is noted without  inflammation.  5. 2.6 cm low density is seen involving anterior portion of midpole  of right kidney most consistent with cyst, but renal ultrasound is  recommended for confirmation and to rule out neoplasm.  Aortic Atherosclerosis (ICD10-I70.0).    12/12/2019 PET scan   PET 12/12/19  IMPRESSION: 1. Widespread metastatic disease, including to bones, liver, thoracoabdominal nodes, left adrenal gland. The primary is presumably an area of distal esophageal wall thickening and hypermetabolism, given the clinical history. 2. Incidental findings, including: Aortic atherosclerosis (ICD10-I70.0), coronary artery atherosclerosis and emphysema (ICD10-J43.9). Pulmonary artery enlargement suggests pulmonary arterial hypertension.   12/18/2019 Imaging   US Abdomen 12/18/19  1. Multiple hepatic hypoechoic lesions concerning for metastatic  disease. Further characterization with MRI without and with  contrast  recommended.  2. Bilateral renal parenchyma atrophy and increased echogenicity,  likely chronic kidney disease. No hydronephrosis or shadowing stone.  3. Echogenic lesion along the posterior mid to lower pole of the  left kidney, indeterminate. Attention on MRI recommended.     12/22/2019 Initial Diagnosis   Esophageal cancer, stage IV ([facility])      REVIEW OF SYSTEMS:   Back pain reported but unable to obtain a comprehensive review of systems.  I have reviewed the past medical history, past surgical history, social history and family history with the patient and they are unchanged from previous note.   PHYSICAL EXAMINATION: ECOG PERFORMANCE STATUS: 3 - Symptomatic, >50% confined to bed  Vitals:   12/28/19 0046 12/28/19 0508  BP: 117/75 122/70  Pulse: (!) 109 (!) 110  Resp: 20 20  Temp: 97.8 F (36.6 C) 98.6 F (37 C)  SpO2: (!) 89% 99%   Filed Weights   12/30/2019 1700 12/28/19 0508  Weight: 92.4 kg 93.5 kg    Intake/Output from previous day: 11/17 0701 - 11/18 0700 In: 596.3 [I.V.:596.3] Out: 750 [Urine:750]  GENERAL: Somewhat somnolent, opens eyes SKIN: skin color, texture, turgor are normal, no rashes or significant lesions EYES: normal, Conjunctiva are pink and non-injected, sclera clear OROPHARYNX:no exudate, no erythema and lips, buccal mucosa, and tongue normal  NECK: supple, thyroid normal size, non-tender, without nodularity LYMPH:  no palpable lymphadenopathy in the cervical, axillary or inguinal LUNGS: clear to auscultation and percussion with normal breathing effort HEART: regular rate & rhythm and no murmurs and no lower extremity edema ABDOMEN:abdomen soft, non-tender and normal bowel sounds Musculoskeletal:no cyanosis of digits and no clubbing  NEURO: Alert, oriented to person  LABORATORY DATA:  I have reviewed  the data as listed CMP Latest Ref Rng & Units 12/28/2019 01/06/2020 02/28/2016  Glucose 70 - 99 mg/dL 96 92 187(H)  BUN 8 - 23  mg/dL 45(H) 51(H) 24(H)  Creatinine 0.61 - 1.24 mg/dL 1.77(H) 1.89(H) 1.86(H)  Sodium 135 - 145 mmol/L 140 137 137  Potassium 3.5 - 5.1 mmol/L 5.1 4.8 4.7  Chloride 98 - 111 mmol/L 103 101 103  CO2 22 - 32 mmol/L 26 25 23   Calcium 8.9 - 10.3 mg/dL 10.5(H) 10.9(H) 8.7(L)  Total Protein 6.5 - 8.1 g/dL 6.3(L) 6.9 -  Total Bilirubin 0.3 - 1.2 mg/dL 0.4 0.5 -  Alkaline Phos 38 - 126 U/L 88 100 -  AST 15 - 41 U/L 22 19 -  ALT 0 - 44 U/L 12 12 -    Lab Results  Component Value Date   WBC 12.0 (H) 12/28/2019   HGB 11.7 (L) 12/28/2019   HCT 38.5 (L) 12/28/2019   MCV 89.7 12/28/2019   PLT 224 12/28/2019   NEUTROABS 9.6 (H) 01/01/2020    MR CERVICAL SPINE WO CONTRAST  Result Date: 12/30/2019 CLINICAL DATA:  Initial evaluation for concern for metastatic disease, incontinence, back pain. EXAM: MRI CERVICAL SPINE WITHOUT CONTRAST TECHNIQUE: Multiplanar, multisequence MR imaging of the cervical spine was performed. No intravenous contrast was administered. COMPARISON:  Prior PET-CT from 12/12/2019 FINDINGS: Alignment: Examination moderately degraded by motion artifact. Straightening of the normal cervical lordosis.  No listhesis. Vertebrae: Scattered osseous metastatic disease seen involving the cervical spine, with definite involvement of the C7, T1, T2, and T3 vertebral bodies. Abnormal signal intensity involving the C5 and C6 vertebral bodies could be related to metastatic disease or possibly reactive degenerative change. Metastatic lesions noted involving the occipital condyles bilaterally. Abnormal edema centered about the right C7-T1 facet likely reflects osseous metastatic disease as well (series 11, image 4). Probable extraosseous extension into the adjacent soft tissues at this level (series 12, image 29) no other definite or convincing extraosseous extension seen on this motion degraded exam. No appreciable epidural involvement. Vertebral body height maintained without pathologic fracture.  Cord: Signal intensity within the cervical spinal cord is grossly within normal limits. No convincing cord signal abnormality on this motion degraded exam. Posterior Fossa, vertebral arteries, paraspinal tissues: Retro cerebellar cyst versus mega cisterna magna noted at the visualized posterior fossa. Craniocervical junction within normal limits. Paraspinous soft tissues otherwise unremarkable. Normal flow voids seen within the vertebral arteries bilaterally. Disc levels: C2-C3: Negative interspace. Moderate right with mild left facet degeneration. No significant canal or foraminal stenosis. C3-C4: Minimal disc bulge. Mild to moderate right with mild left facet hypertrophy. No significant spinal stenosis. Foramina remain patent. C4-C5: Shallow central disc osteophyte complex indents the ventral thecal sac. Mild spinal stenosis without significant cord deformity. Foramina remain patent. C5-C6: Broad-based posterior disc osteophyte complex flattens and indents the ventral thecal sac, slightly eccentric to the left. Resultant mild spinal stenosis with mild flattening of the left hemi cord. No definite cord signal changes. Superimposed right greater than left uncovertebral hypertrophy with no more than mild bilateral C6 foraminal narrowing. C6-C7: Degenerative intervertebral disc space narrowing with diffuse disc osteophyte. Mild flattening of the ventral thecal sac without significant spinal stenosis. Right greater than left uncovertebral spurring with resultant moderate right C7 foraminal stenosis. No significant left foraminal narrowing. C7-T1: Minimal disc bulge. Osseous metastatic disease involves the right C7-T1 facet. Probable involvement of the adjacent right T1 transverse process and posterior right first rib (series 12, images 29, 30). No significant  epidural extension at this time. Spinal canal remains patent. Neural foramina remain grossly patent as well. IMPRESSION: 1. Scattered osseous metastatic disease  involving the cervical spine as above. No significant epidural extension at this time. No pathologic fracture or other complication. 2. Metastatic deposit centered about the right C7-T1 facet with associated extraosseous extension into the surrounding soft tissues as above. No other significant extra osseous disease identified on this motion degraded exam. 3. Moderate multilevel cervical spondylosis as above, with resultant mild spinal stenosis at C4-5 through C6-7. Moderate right C7 foraminal narrowing related to disc osteophyte and uncovertebral disease. Electronically Signed   By: Jeannine Boga M.D.   On: 12/17/2019 21:59   MR LUMBAR SPINE WO CONTRAST  Result Date: 01/05/2020 CLINICAL DATA:  Initial evaluation for back pain, incontinence, metastatic disease evaluation. EXAM: MRI LUMBAR SPINE WITHOUT CONTRAST TECHNIQUE: Multiplanar, multisequence MR imaging of the lumbar spine was performed. No intravenous contrast was administered. COMPARISON:  Prior PET-CT from 12/12/2019 FINDINGS: Segmentation: Standard. Lowest well-formed disc space labeled the L5-S1 level. Alignment: 2 mm retrolisthesis of L2 on L3. Alignment otherwise normal with preservation of the normal lumbar lordosis. Vertebrae: Extensive diffuse osseous metastases seen throughout the visualized thoracolumbar spine as well as the sacrum and pelvis. There is involvement of the centrally all levels. For reference purposes, largest of these lesions involves the left anterior aspect of the L2 vertebral body and measures approximately 3.2 cm in greatest dimension (series 11, image 11). Vertebral body height maintained without pathologic fracture. There is early extraosseous extension at the level of the sacrum/S1 segment, with tumor encroaching upon the descending S1 nerve roots bilaterally, right greater than left (series 13, image 34). Slight mass effect on the descending right S1 nerve root which is somewhat flattened and displaced  posteriorly. No overt neural impingement. No other significant extra osseous extension of tumor seen elsewhere within the lumbar spine at this time. No other significant epidural extension. Conus medullaris and cauda equina: Conus extends to the T12-L1 level. Conus and cauda equina appear normal. No evidence for compression of the distal cord or nerve roots of the cauda equina. Paraspinal and other soft tissues: Left periaortic/retroperitoneal lymph nodes measuring up to 9 mm noted (series 12, image 5), nonspecific, but could reflect nodal metastases. Atherosclerotic change noted within the visualized aorta. Multiple scattered cystic lesions noted about the kidneys, several of which demonstrate intrinsic T1 hyperintensity within the right kidney (series 13, image 10). Findings are indeterminate, and could reflect proteinaceous and/or hemorrhagic cyst. These are not well assessed on this noncontrast examination. Paraspinous soft tissues demonstrate no other acute finding. Disc levels: T12-L1: Mild diffuse disc bulge with disc desiccation. Chronic endplate Schmorl's node deformities. No significant spinal stenosis. Foramina remain patent. L1-2: Mild disc bulge with disc desiccation. Chronic endplate Schmorl's node deformity. Mild facet hypertrophy. No spinal stenosis. Foramina remain patent. L2-3: Trace retrolisthesis. Degenerative intervertebral disc space narrowing with mild diffuse disc bulge and disc desiccation. Mild left greater than right facet hypertrophy. Resultant mild narrowing of the lateral recesses bilaterally, left greater than right. Central canal remains patent. No significant foraminal encroachment. L3-4: Disc desiccation with mild disc bulge. Central annular fissure. Mild bilateral facet hypertrophy. Resultant mild lateral recess narrowing bilaterally. Central canal remains patent. No significant foraminal encroachment. L4-5: Disc desiccation with mild diffuse disc bulge. Central annular fissure.  Mild facet and ligament flavum hypertrophy. Resultant mild narrowing of the right lateral recess. Foramina remain patent. L5-S1: Disc desiccation without significant disc bulge. Severe right with moderate  left facet degeneration. Mild epidural lipomatosis. No significant canal or foraminal stenosis. IMPRESSION: 1. Extensive diffuse osseous metastases throughout the visualized thoracolumbar spine and pelvis. No pathologic fracture. 2. Early extraosseous extension of tumor at the level of the sacrum/S1 segment, with tumor encroaching upon the descending S1 nerve roots bilaterally, right greater than left. Slight mass effect on the descending right S1 nerve root without overt neural impingement. 3. No other significant extra osseous extension or epidural tumor elsewhere within the lumbar spine. No cord or cauda equina compression. 4. Left periaortic/retroperitoneal lymph nodes measuring up to 9 mm, nonspecific, but could reflect nodal metastases. 5. Mild multilevel degenerative spondylosis with resultant mild lateral recess narrowing bilaterally at L2-3 and L3-4, and on the right at L4-5. Electronically Signed   By: Jeannine Boga M.D.   On: 12/20/2019 21:46   NM PET Image Initial (PI) Skull Base To Thigh  Result Date: 12/12/2019 CLINICAL DATA:  Initial treatment strategy for neoplasm of esophagus. Bone metastasis. COVID vaccine in left arm 5/21 EXAM: NUCLEAR MEDICINE PET SKULL BASE TO THIGH TECHNIQUE: 11.1 mCi F-18 FDG was injected intravenously. Full-ring PET imaging was performed from the skull base to thigh after the radiotracer. CT data was obtained and used for attenuation correction and anatomic localization. Fasting blood glucose: 89 mg/dl COMPARISON:  11/20/2019 chest abdomen and pelvic CTs. FINDINGS: Mediastinal blood pool activity: SUV max 3.3 Liver activity: SUV max NA NECK: Laryngeal hypermetabolism is favored to be physiologic, without well-defined CT correlate. No cervical nodal  hypermetabolism. Incidental CT findings: No cervical adenopathy. CHEST: Small hypermetabolic mediastinal nodes, including a prevascular node of 7 mm and a S.U.V. max of 12.4 on 63/4. Distal esophageal hypermetabolism with mild wall thickening, including at a S.U.V. max of 18.6 on 97/4. Incidental CT findings: Aortic and coronary artery atherosclerosis. Pulmonary artery enlargement, outflow tract 3.3 cm. Centrilobular and paraseptal emphysema. Central right upper lobe 1.3 cm pulmonary nodule is again favored to represent a hamartoma or granuloma, given central calcification. Not hypermetabolic. ABDOMEN/PELVIS: Bilateral hepatic metastasis which are relatively CT occult. Example in the anterior left hepatic lobe at a S.U.V. max of 13.3 and within the central high right hepatic lobe at a S.U.V. max of 12.9. Abdominal nodal metastasis, with index portacaval node measuring 1.1 cm and a S.U.V. max of 15.9 on 112/4. Multifocal right-sided colonic hypermetabolism is favored to be physiologic. Left adrenal hypermetabolic nodule measures 1.1 cm and a S.U.V. max of 15.0 on 108/4 Incidental CT findings: Extensive colonic diverticulosis. Cholecystectomy. Abdominal aortic atherosclerosis. Nonaneurysmal dilatation at 2.8 cm. SKELETON: Widespread osseous metastasis. The previously described left scapular/coracoid lesion measures 4.5 cm and a S.U.V. max of 21.0 on 37/4. A sacral lytic lesion measures a S.U.V. max of 16.7. Incidental CT findings: none IMPRESSION: 1. Widespread metastatic disease, including to bones, liver, thoracoabdominal nodes, left adrenal gland. The primary is presumably an area of distal esophageal wall thickening and hypermetabolism, given the clinical history. 2. Incidental findings, including: Aortic atherosclerosis (ICD10-I70.0), coronary artery atherosclerosis and emphysema (ICD10-J43.9). Pulmonary artery enlargement suggests pulmonary arterial hypertension. Electronically Signed   By: Abigail Miyamoto M.D.    On: 12/12/2019 09:20   DG CHEST PORT 1 VIEW  Result Date: 12/20/2019 CLINICAL DATA:  Metastatic cancer EXAM: PORTABLE CHEST 1 VIEW COMPARISON:  03/26/2014 FINDINGS: Calcified granuloma in the right mid lung. Heart is normal size. No confluent opacities or effusions. No acute bony abnormality. IMPRESSION: No active disease. Electronically Signed   By: Rolm Baptise M.D.   On: 12/17/2019 20:25  ASSESSMENT AND PLAN: 1.  Diffuse metastatic cancer to the bones, liver, nodes, left adrenal gland, uncertain primary-?  Esophageal cancer 2.  Acute metabolic encephalopathy 3.  Mild anemia 4.  AKI 5.  Hypercalcemia 6.  Pain secondary to bone metastasis 7.  Bipolar disorder 8.  Diabetes mellitus 9.  Rheumatoid arthritis  -MRI images have been reviewed.  No evidence of cord compression.  Will consult radiation oncology for consideration of palliative radiation of his bone mets for pain control. -Order placed for ultrasound-guided biopsy of one of his liver lesions. -Recommend GI consult for possible EGD due to esophageal wall thickening. -Continue IV fluids for AKI and hypercalcemia.  If calcium level worsens, may need to consider bisphosphonate therapy if renal function improves. -Continue current pain medications.    LOS: 1 day   Mikey Bussing, DNP, AGPCNP-BC, AOCNP 12/28/19  Addendum  I have seen the patient, examined him. I agree with the assessment and and plan and have edited the notes.   I have reviewed his spine MRI and discussed the finding with patient.  It showed diffuse bone metastasis, no cord compression.  He does have extra osseous extension of tumor at the level of sacrum/S1, and C7/T1, which may require palliative radiation.  I have reached out to Dr. Isidore Moos.  We have consulted GI and IR for biopsy.  I greatly appreciate their help.  I prefer patient undergo EGD first, if he has definitive esophageal cancer, we can probably cancel his liver biopsy.  If EGD negative, will  proceed to liver biopsy for definitive diagnosis.   I will also consult palliative medicine, for symptom management, and discuss goal of care.  His prognosis and treatment plan is still pending, however he has very high disease burden, and poor performance status, not a great candidate for intensive chemotherapy.   I will follow up.   Truitt Merle  12/28/2019

## 2019-12-28 NOTE — Progress Notes (Signed)
Patient had MRI last night 11/17, When staff arrived onsite this morning, Mr Andrew Warren  phone was located in the MRI Dressing room locker. Phone was returned to Mr Andrew Warren this morning at 7:13am. Staff woke patient up to let him know we returned the phone
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
## 2019-12-28 NOTE — Progress Notes (Signed)
Patient ID: Andrew Warren, male   DOB: 12/05/1950, 69 y.o.   MRN: [id number] BP 122/70 (BP Location: Left Arm)   Pulse (!) 110   Temp 98.6 F (37 C) (Oral)   Resp 20   Ht 5\' 8"  (1.727 m)   Wt 93.5 kg   SpO2 99%   BMI 31.34 kg/m  Films reviewed. There is no compressive lesion in the lumbar region or the conus medullaris. There are widespread lesions in both the cervical and lumbar vertebrae, no compression though. Recommend chest, abd, and pelvic ct. Urinary incontinence is not explained by the [facility]

## 2019-12-28 NOTE — Progress Notes (Signed)
PROGRESS NOTE    Andrew Warren  YIF:[id number] DOB: [birth year] DOA: 12/12/2019 PCP: Andrew Percy, MD   No chief complaint on file.  Brief Narrative:  Andrew Warren is Andrew Warren 69 y.o. male with medical history significant of bipolar disorder, HTN, HLD, and metastatic cancer who presents to the hospital after Andrew Warren clinic visit.    History is obtained from patient and the medical record.  Limited from patient due to encephalopathy.  Wife not at bedside and unable to be reached by phone.  Per oncology note, he was seen by orthopedics outpatient who diagnosed cancer via MRI and he was referred to oncology in early October 10/21.  He has not had any definitive diagnosis with biopsy yet.  He's taking morphine for pain in his back, chest and upper abdomen.  He's noticed recent urinary frequency and incontinence over the past 2 days.  He hasn't had Andrew Warren BM in weeks.  He's slower to speak and respond since heing on morphine.  His wife helps him ambulate, use the bathroom over the past week.  Seems like he's been weaker over the apst several weeks.  He notes he's constipated and has issues urinating.  C/o numbness to groin.  He's unable to clearly describe timing.  He complains of pain, but isn't able to describe this well.  No smoking or etoh per his report.   Assessment & Plan:   Active Problems:   Metastatic cancer (Andrew Warren)  Saddle Anesthesia  Incontinence  Constipation  Concern for Cord Compression in setting of metastatic disease:  Follow MRI C/T/L spine without contrast given CKD MRI c spine with scattered osseous metastatic dz involving the cervical spine (see report) MRI with extensive diffuse osseous mets throughout visualized thoracolumbar spine and pelvis.  Early extraosseous extension of tumor at level of the sacrum /S1 segment with tumor encroaching upon the descending S1 nerve roots bilaterally, R > L.  Slight mass effect on the descending R S1 nerve root without overt neural impingement.   No cord or cauda equina compression.  (see report)  Additional imaging pending - pt did not tolerate MRI last night Per Dr. Christella Warren no compressive lesion in lumbar region or conus - recommended CT chest/abdomen/pelvis -> will defer additional imaging to oncology UA not c/w UTI Unclear cause of symptoms above  Diffuse Metastatic Cancer with uncertain primary oncology recommending bx for primary cancer dx Oncology c/s, appreciate recommendations - GI and IR c/s for biopsy  Will make NPO at midnight PET 12/12/19 with widespread metastatic dz including to bones, liver, thoracoabdominal nodes, L adrenal gland.  The primary is presumably an area of distal esophageal wall thickening and hypermetabolism given the clinical hx.  See report.  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  Will need to discuss additionally with wife and/or pt when mental status improves.  DVT prophylaxis: SCD Code Status: full  Family Communication: none at bedside Disposition:   Status is:  Inpatient  Remains inpatient appropriate because:Inpatient level of care appropriate due to severity of illness   Dispo: The patient is from: Home              Anticipated d/c is to: pending              Anticipated d/c date is: > 3 days              Patient currently is not medically stable to d/c.  Consultants:   Oncology  GI  IR  palliative  Procedures:    none  Antimicrobials:  Anti-infectives (From admission, onward)   None        Subjective: No new complaints  Objective: Vitals:   12/11/2019 2231 12/28/19 0046 12/28/19 0508 12/28/19 1531  BP: (!) 147/90 117/75 122/70 120/76  Pulse: (!) 104 (!) 109 (!) 110 (!) 110  Resp: 20 20 20 16   Temp: 99 F (37.2 C) 97.8 F (36.6 C) 98.6 F (37 C) 97.9 F (36.6 C)  TempSrc: Oral Oral Oral Oral  SpO2: 98% (!) 89% 99% 94%  Weight:   93.5 kg   Height:        Intake/Output Summary (Last 24 hours) at 12/28/2019 1710 Last data filed at 12/28/2019 1500 Gross per 24 hour  Intake 1329.86 ml  Output 1350 ml  Net -20.14 ml   Filed Weights   12/22/2019 1700 12/28/19 0508  Weight: 92.4 kg 93.5 kg    Examination:  General exam: Appears calm and comfortable  Respiratory system: unlabored Cardiovascular system: RRR Gastrointestinal system: Abdomen is nondistended, soft and nontender.. Central nervous system: Alert. No focal neurological deficits. Extremities: no LEE Skin: No rashes, lesions or ulcers   Data Reviewed: I have personally reviewed following labs and imaging studies  CBC: Recent Labs  Lab 12/18/2019 1748 12/28/19 0555  WBC 13.1* 12.0*  NEUTROABS 9.6*  --   HGB 11.9* 11.7*  HCT 38.7* 38.5*  MCV 88.4 89.7  PLT 256 244    Basic Metabolic Panel: Recent Labs  Lab 12/25/2019 1748 12/28/19 0555  NA 137 140  K 4.8 5.1  CL 101 103  CO2 25 26  GLUCOSE 92 96  BUN 51* 45*  CREATININE 1.89* 1.77*  CALCIUM 10.9* 10.5*  MG 2.5*  --   PHOS 4.4  --     GFR: Estimated Creatinine Clearance: 44.3 mL/min (Andrew Warren) (by C-G formula based on SCr of 1.77 mg/dL (H)).  Liver Function Tests: Recent Labs  Lab 01/07/2020 1748 12/28/19 0555  AST 19 22  ALT 12 12  ALKPHOS 100 88  BILITOT 0.5 0.4  PROT 6.9 6.3*  ALBUMIN 3.3* 2.9*    CBG: Recent Labs  Lab 12/20/2019 2126 12/28/19 0731 12/28/19 0822 12/28/19 1202  GLUCAP 93 61* 96 135*     Recent Results (from the past  240 hour(s))  Respiratory Panel by RT PCR (Flu Andrew Warren&B, Covid) - Nasopharyngeal Swab     Status: None   Collection Time: 01/01/2020  5:20 PM   Specimen: Nasopharyngeal Swab  Result Value Ref Range Status   SARS Coronavirus 2 by RT PCR NEGATIVE NEGATIVE Final    Comment: (NOTE) SARS-CoV-2 target nucleic acids are NOT DETECTED.  The SARS-CoV-2 RNA is generally detectable in upper respiratoy specimens during the acute phase of infection. The lowest concentration of SARS-CoV-2 viral copies this assay can detect is 131 copies/mL. Dorina Ribaudo negative result does not preclude SARS-Cov-2 infection and should not be used as the sole basis for treatment or  other patient management decisions. Jayquon Theiler negative result may occur with  improper specimen collection/handling, submission of specimen other than nasopharyngeal swab, presence of viral mutation(s) within the areas targeted by this assay, and inadequate number of viral copies (<131 copies/mL). Balinda Heacock negative result must be combined with clinical observations, patient history, and epidemiological information. The expected result is Negative.  Fact Sheet for Patients:  PinkCheek.be  Fact Sheet for Healthcare Providers:  GravelBags.it  This test is no t yet approved or cleared by the [place] FDA and  has been authorized for detection and/or diagnosis of SARS-CoV-2 by FDA under an Emergency Use Authorization (EUA). This EUA will remain  in effect (meaning this test can be used) for the duration of the COVID-19 declaration under Section 564(b)(1) of the Act, 21 U.S.C. section 360bbb-3(b)(1), unless the authorization is terminated or revoked sooner.     Influenza Caileen Veracruz by PCR NEGATIVE NEGATIVE Final   Influenza B by PCR NEGATIVE NEGATIVE Final    Comment: (NOTE) The Xpert Xpress SARS-CoV-2/FLU/RSV assay is intended as an aid in  the diagnosis of influenza from Nasopharyngeal swab specimens and  should  not be used as Evelyn Moch sole basis for treatment. Nasal washings and  aspirates are unacceptable for Xpert Xpress SARS-CoV-2/FLU/RSV  testing.  Fact Sheet for Patients: PinkCheek.be  Fact Sheet for Healthcare Providers: GravelBags.it  This test is not yet approved or cleared by the [place] FDA and  has been authorized for detection and/or diagnosis of SARS-CoV-2 by  FDA under an Emergency Use Authorization (EUA). This EUA will remain  in effect (meaning this test can be used) for the duration of the  Covid-19 declaration under Section 564(b)(1) of the Act, 21  U.S.C. section 360bbb-3(b)(1), unless the authorization is  terminated or revoked. Performed at [facility], [place] [address], [place], [place] [address]          Radiology Studies: MR CERVICAL SPINE WO CONTRAST  Result Date: 12/19/2019 CLINICAL DATA:  Initial evaluation for concern for metastatic disease, incontinence, back pain. EXAM: MRI CERVICAL SPINE WITHOUT CONTRAST TECHNIQUE: Multiplanar, multisequence MR imaging of the cervical spine was performed. No intravenous contrast was administered. COMPARISON:  Prior PET-CT from 12/12/2019 FINDINGS: Alignment: Examination moderately degraded by motion artifact. Straightening of the normal cervical lordosis.  No listhesis. Vertebrae: Scattered osseous metastatic disease seen involving the cervical spine, with definite involvement of the C7, T1, T2, and T3 vertebral bodies. Abnormal signal intensity involving the C5 and C6 vertebral bodies could be related to metastatic disease or possibly reactive degenerative change. Metastatic lesions noted involving the occipital condyles bilaterally. Abnormal edema centered about the right C7-T1 facet likely reflects osseous metastatic disease as well (series 11, image 4). Probable extraosseous extension into the adjacent soft tissues at this level (series 12, image 29) no  other definite or convincing extraosseous extension seen on this motion degraded exam. No appreciable epidural involvement. Vertebral body height maintained without pathologic fracture. Cord: Signal intensity within the cervical spinal cord is grossly within normal limits. No convincing cord signal abnormality on this motion degraded exam. Posterior Fossa, vertebral arteries, paraspinal tissues: Retro cerebellar cyst versus mega cisterna magna noted at the visualized posterior fossa. Craniocervical junction within normal limits. Paraspinous soft tissues otherwise unremarkable. Normal flow voids seen within the vertebral arteries bilaterally. Disc levels: C2-C3: Negative interspace. Moderate right with mild left facet degeneration. No significant canal or foraminal stenosis. C3-C4: Minimal disc bulge. Mild to moderate right with mild left facet hypertrophy. No significant spinal  stenosis. Foramina remain patent. C4-C5: Shallow central disc osteophyte complex indents the ventral thecal sac. Mild spinal stenosis without significant cord deformity. Foramina remain patent. C5-C6: Broad-based posterior disc osteophyte complex flattens and indents the ventral thecal sac, slightly eccentric to the left. Resultant mild spinal stenosis with mild flattening of the left hemi cord. No definite cord signal changes. Superimposed right greater than left uncovertebral hypertrophy with no more than mild bilateral C6 foraminal narrowing. C6-C7: Degenerative intervertebral disc space narrowing with diffuse disc osteophyte. Mild flattening of the ventral thecal sac without significant spinal stenosis. Right greater than left uncovertebral spurring with resultant moderate right C7 foraminal stenosis. No significant left foraminal narrowing. C7-T1: Minimal disc bulge. Osseous metastatic disease involves the right C7-T1 facet. Probable involvement of the adjacent right T1 transverse process and posterior right first rib (series 12, images  29, 30). No significant epidural extension at this time. Spinal canal remains patent. Neural foramina remain grossly patent as well. IMPRESSION: 1. Scattered osseous metastatic disease involving the cervical spine as above. No significant epidural extension at this time. No pathologic fracture or other complication. 2. Metastatic deposit centered about the right C7-T1 facet with associated extraosseous extension into the surrounding soft tissues as above. No other significant extra osseous disease identified on this motion degraded exam. 3. Moderate multilevel cervical spondylosis as above, with resultant mild spinal stenosis at C4-5 through C6-7. Moderate right C7 foraminal narrowing related to disc osteophyte and uncovertebral disease. Electronically Signed   By: Jeannine Boga M.D.   On: 12/30/2019 21:59   MR LUMBAR SPINE WO CONTRAST  Result Date: 01/06/2020 CLINICAL DATA:  Initial evaluation for back pain, incontinence, metastatic disease evaluation. EXAM: MRI LUMBAR SPINE WITHOUT CONTRAST TECHNIQUE: Multiplanar, multisequence MR imaging of the lumbar spine was performed. No intravenous contrast was administered. COMPARISON:  Prior PET-CT from 12/12/2019 FINDINGS: Segmentation: Standard. Lowest well-formed disc space labeled the L5-S1 level. Alignment: 2 mm retrolisthesis of L2 on L3. Alignment otherwise normal with preservation of the normal lumbar lordosis. Vertebrae: Extensive diffuse osseous metastases seen throughout the visualized thoracolumbar spine as well as the sacrum and pelvis. There is involvement of the centrally all levels. For reference purposes, largest of these lesions involves the left anterior aspect of the L2 vertebral body and measures approximately 3.2 cm in greatest dimension (series 11, image 11). Vertebral body height maintained without pathologic fracture. There is early extraosseous extension at the level of the sacrum/S1 segment, with tumor encroaching upon the descending  S1 nerve roots bilaterally, right greater than left (series 13, image 34). Slight mass effect on the descending right S1 nerve root which is somewhat flattened and displaced posteriorly. No overt neural impingement. No other significant extra osseous extension of tumor seen elsewhere within the lumbar spine at this time. No other significant epidural extension. Conus medullaris and cauda equina: Conus extends to the T12-L1 level. Conus and cauda equina appear normal. No evidence for compression of the distal cord or nerve roots of the cauda equina. Paraspinal and other soft tissues: Left periaortic/retroperitoneal lymph nodes measuring up to 9 mm noted (series 12, image 5), nonspecific, but could reflect nodal metastases. Atherosclerotic change noted within the visualized aorta. Multiple scattered cystic lesions noted about the kidneys, several of which demonstrate intrinsic T1 hyperintensity within the right kidney (series 13, image 10). Findings are indeterminate, and could reflect proteinaceous and/or hemorrhagic cyst. These are not well assessed on this noncontrast examination. Paraspinous soft tissues demonstrate no other acute finding. Disc levels: T12-L1: Mild diffuse disc  bulge with disc desiccation. Chronic endplate Schmorl's node deformities. No significant spinal stenosis. Foramina remain patent. L1-2: Mild disc bulge with disc desiccation. Chronic endplate Schmorl's node deformity. Mild facet hypertrophy. No spinal stenosis. Foramina remain patent. L2-3: Trace retrolisthesis. Degenerative intervertebral disc space narrowing with mild diffuse disc bulge and disc desiccation. Mild left greater than right facet hypertrophy. Resultant mild narrowing of the lateral recesses bilaterally, left greater than right. Central canal remains patent. No significant foraminal encroachment. L3-4: Disc desiccation with mild disc bulge. Central annular fissure. Mild bilateral facet hypertrophy. Resultant mild lateral  recess narrowing bilaterally. Central canal remains patent. No significant foraminal encroachment. L4-5: Disc desiccation with mild diffuse disc bulge. Central annular fissure. Mild facet and ligament flavum hypertrophy. Resultant mild narrowing of the right lateral recess. Foramina remain patent. L5-S1: Disc desiccation without significant disc bulge. Severe right with moderate left facet degeneration. Mild epidural lipomatosis. No significant canal or foraminal stenosis. IMPRESSION: 1. Extensive diffuse osseous metastases throughout the visualized thoracolumbar spine and pelvis. No pathologic fracture. 2. Early extraosseous extension of tumor at the level of the sacrum/S1 segment, with tumor encroaching upon the descending S1 nerve roots bilaterally, right greater than left. Slight mass effect on the descending right S1 nerve root without overt neural impingement. 3. No other significant extra osseous extension or epidural tumor elsewhere within the lumbar spine. No cord or cauda equina compression. 4. Left periaortic/retroperitoneal lymph nodes measuring up to 9 mm, nonspecific, but could reflect nodal metastases. 5. Mild multilevel degenerative spondylosis with resultant mild lateral recess narrowing bilaterally at L2-3 and L3-4, and on the right at L4-5. Electronically Signed   By: Jeannine Boga M.D.   On: 01/05/2020 21:46   DG CHEST Andrew 1 VIEW  Result Date: 01/02/2020 CLINICAL DATA:  Metastatic cancer EXAM: PORTABLE CHEST 1 VIEW COMPARISON:  03/26/2014 FINDINGS: Calcified granuloma in the right mid lung. Heart is normal size. No confluent opacities or effusions. No acute bony abnormality. IMPRESSION: No active disease. Electronically Signed   By: Rolm Baptise M.D.   On: 12/31/2019 20:25        Scheduled Meds: . atorvastatin  10 mg Oral q1800  . divalproex  1,500 mg Oral QHS  . feeding supplement  237 mL Oral BID BM  . insulin aspart  0-5 Units Subcutaneous QHS  . insulin aspart  0-9  Units Subcutaneous TID WC  . [START ON 12/20/2019] multivitamin with minerals  1 tablet Oral Daily  . QUEtiapine  25 mg Oral QHS  . tamsulosin  0.4 mg Oral Daily   Continuous Infusions: . dextrose 5 % and 0.9% NaCl 100 mL/hr at 12/28/19 1500     LOS: 1 day    Time spent: over 30 min    Fayrene Helper, MD [facility]   To contact the attending provider between 7A-7P or the covering provider during after hours 7P-7A, please log into the web site [email or web address] and access using universal [place] password for that web site. If you do not have the password, please call the hospital operator.  12/28/2019, 5:10 PM

## 2019-12-29 ENCOUNTER — Inpatient Hospital Stay (HOSPITAL_COMMUNITY): Payer: Medicare Other

## 2019-12-29 ENCOUNTER — Other Ambulatory Visit: Payer: Self-pay

## 2019-12-29 ENCOUNTER — Ambulatory Visit
Admit: 2019-12-29 | Discharge: 2019-12-29 | Disposition: A | Discharge status: Home / Self Care | Payer: Medicare Other | Attending: Radiation Oncology | Admitting: Radiation Oncology

## 2019-12-29 ENCOUNTER — Inpatient Hospital Stay (HOSPITAL_COMMUNITY): Payer: Medicare Other | Admitting: Anesthesiology

## 2019-12-29 ENCOUNTER — Encounter (HOSPITAL_COMMUNITY): Payer: Self-pay | Admitting: Family Medicine

## 2019-12-29 ENCOUNTER — Encounter (HOSPITAL_COMMUNITY): Admission: AD | Disposition: E | Payer: Self-pay | Source: Ambulatory Visit | Attending: Internal Medicine

## 2019-12-29 DIAGNOSIS — K3189 Other diseases of stomach and duodenum: Secondary | ICD-10-CM | POA: Diagnosis not present

## 2019-12-29 DIAGNOSIS — C787 Secondary malignant neoplasm of liver and intrahepatic bile duct: Secondary | ICD-10-CM

## 2019-12-29 DIAGNOSIS — C7951 Secondary malignant neoplasm of bone: Secondary | ICD-10-CM

## 2019-12-29 DIAGNOSIS — K2289 Other specified disease of esophagus: Secondary | ICD-10-CM | POA: Diagnosis not present

## 2019-12-29 DIAGNOSIS — D490 Neoplasm of unspecified behavior of digestive system: Secondary | ICD-10-CM | POA: Diagnosis not present

## 2019-12-29 DIAGNOSIS — K2970 Gastritis, unspecified, without bleeding: Secondary | ICD-10-CM

## 2019-12-29 DIAGNOSIS — R948 Abnormal results of function studies of other organs and systems: Secondary | ICD-10-CM

## 2019-12-29 DIAGNOSIS — Z515 Encounter for palliative care: Secondary | ICD-10-CM | POA: Diagnosis not present

## 2019-12-29 HISTORY — PX: BIOPSY: SHX5522

## 2019-12-29 HISTORY — PX: ESOPHAGOGASTRODUODENOSCOPY (EGD) WITH PROPOFOL: SHX5813

## 2019-12-29 LAB — CBC WITH DIFFERENTIAL/PLATELET
Abs Immature Granulocytes: 0.4 10*3/uL — ABNORMAL HIGH (ref 0.00–0.07)
Basophils Absolute: 0.1 10*3/uL (ref 0.0–0.1)
Basophils Relative: 1 %
Eosinophils Absolute: 0.2 10*3/uL (ref 0.0–0.5)
Eosinophils Relative: 2 %
HCT: 37.5 % — ABNORMAL LOW (ref 39.0–52.0)
Hemoglobin: 11.5 g/dL — ABNORMAL LOW (ref 13.0–17.0)
Immature Granulocytes: 3 %
Lymphocytes Relative: 12 %
Lymphs Abs: 1.5 10*3/uL (ref 0.7–4.0)
MCH: 27 pg (ref 26.0–34.0)
MCHC: 30.7 g/dL (ref 30.0–36.0)
MCV: 88 fL (ref 80.0–100.0)
Monocytes Absolute: 1.7 10*3/uL — ABNORMAL HIGH (ref 0.1–1.0)
Monocytes Relative: 14 %
Neutro Abs: 8.3 10*3/uL — ABNORMAL HIGH (ref 1.7–7.7)
Neutrophils Relative %: 68 %
Platelets: 213 10*3/uL (ref 150–400)
RBC: 4.26 MIL/uL (ref 4.22–5.81)
RDW: 14.6 % (ref 11.5–15.5)
WBC: 12.1 10*3/uL — ABNORMAL HIGH (ref 4.0–10.5)
nRBC: 0 % (ref 0.0–0.2)

## 2019-12-29 LAB — COMPREHENSIVE METABOLIC PANEL
ALT: 13 U/L (ref 0–44)
AST: 21 U/L (ref 15–41)
Albumin: 2.8 g/dL — ABNORMAL LOW (ref 3.5–5.0)
Alkaline Phosphatase: 96 U/L (ref 38–126)
Anion gap: 10 (ref 5–15)
BUN: 34 mg/dL — ABNORMAL HIGH (ref 8–23)
CO2: 27 mmol/L (ref 22–32)
Calcium: 10.6 mg/dL — ABNORMAL HIGH (ref 8.9–10.3)
Chloride: 108 mmol/L (ref 98–111)
Creatinine, Ser: 1.65 mg/dL — ABNORMAL HIGH (ref 0.61–1.24)
GFR, Estimated: 45 mL/min — ABNORMAL LOW (ref >60)
Glucose, Bld: 123 mg/dL — ABNORMAL HIGH (ref 70–99)
Potassium: 5.1 mmol/L (ref 3.5–5.1)
Sodium: 145 mmol/L (ref 135–145)
Total Bilirubin: 0.3 mg/dL (ref 0.3–1.2)
Total Protein: 6.1 g/dL — ABNORMAL LOW (ref 6.5–8.1)

## 2019-12-29 LAB — GLUCOSE, CAPILLARY
Glucose-Capillary: 170 mg/dL — ABNORMAL HIGH (ref 70–99)
Glucose-Capillary: 158 mg/dL — ABNORMAL HIGH (ref 70–99)
Glucose-Capillary: 68 mg/dL — ABNORMAL LOW (ref 70–99)
Glucose-Capillary: 123 mg/dL — ABNORMAL HIGH (ref 70–99)
Glucose-Capillary: 75 mg/dL (ref 70–99)
Glucose-Capillary: 93 mg/dL (ref 70–99)

## 2019-12-29 LAB — PHOSPHORUS: Phosphorus: 3.7 mg/dL (ref 2.5–4.6)

## 2019-12-29 LAB — MAGNESIUM: Magnesium: 2.3 mg/dL (ref 1.7–2.4)

## 2019-12-29 LAB — PROTIME-INR
INR: 1.1 (ref 0.8–1.2)
Prothrombin Time: 14.2 seconds (ref 11.4–15.2)

## 2019-12-29 SURGERY — ESOPHAGOGASTRODUODENOSCOPY (EGD) WITH PROPOFOL
Anesthesia: Monitor Anesthesia Care

## 2019-12-29 MED ORDER — SENNA 8.6 MG PO TABS
2.0000 | ORAL_TABLET | Freq: Two times a day (BID) | ORAL | Status: DC
  Administered 2019-12-29 – 2020-01-06 (×16): 17.2 mg via ORAL
  Filled 2019-12-29 (×17): qty 2

## 2019-12-29 MED ORDER — ACETAMINOPHEN 500 MG PO TABS
1000.0000 mg | ORAL_TABLET | Freq: Three times a day (TID) | ORAL | Status: DC
  Administered 2019-12-29 – 2019-12-30 (×3): 1000 mg via ORAL
  Filled 2019-12-29 (×3): qty 2

## 2019-12-29 MED ORDER — SODIUM CHLORIDE 0.9 % IV SOLN: INTRAVENOUS | Status: DC | PRN

## 2019-12-29 MED ORDER — ONDANSETRON HCL 4 MG/2ML IJ SOLN
INTRAMUSCULAR | Status: DC | PRN
  Administered 2019-12-29: 4 mg via INTRAVENOUS

## 2019-12-29 MED ORDER — PHENYLEPHRINE 40 MCG/ML (10ML) SYRINGE FOR IV PUSH (FOR BLOOD PRESSURE SUPPORT)
PREFILLED_SYRINGE | INTRAVENOUS | Status: DC | PRN
  Administered 2019-12-29 (×6): 120 ug via INTRAVENOUS

## 2019-12-29 MED ORDER — PROPOFOL 500 MG/50ML IV EMUL
INTRAVENOUS | Status: DC | PRN
  Administered 2019-12-29: 30 mg via INTRAVENOUS

## 2019-12-29 MED ORDER — PANTOPRAZOLE SODIUM 40 MG PO TBEC
40.0000 mg | DELAYED_RELEASE_TABLET | Freq: Two times a day (BID) | ORAL | Status: DC
  Administered 2019-12-29 – 2020-01-07 (×18): 40 mg via ORAL
  Filled 2019-12-29 (×19): qty 1

## 2019-12-29 MED ORDER — PROPOFOL 10 MG/ML IV BOLUS
INTRAVENOUS | Status: AC
  Filled 2019-12-29: qty 20

## 2019-12-29 MED ORDER — SUCRALFATE 1 GM/10ML PO SUSP
1.0000 g | Freq: Three times a day (TID) | ORAL | Status: DC
  Administered 2019-12-29 – 2020-01-07 (×33): 1 g via ORAL
  Filled 2019-12-29 (×34): qty 10

## 2019-12-29 MED ORDER — LACTATED RINGERS IV SOLN: INTRAVENOUS | Status: DC | PRN

## 2019-12-29 MED ORDER — DEXTROSE 50 % IV SOLN
12.5000 g | INTRAVENOUS | Status: AC
  Administered 2019-12-29: 12.5 g via INTRAVENOUS

## 2019-12-29 MED ORDER — LIDOCAINE HCL (CARDIAC) PF 100 MG/5ML IV SOSY
PREFILLED_SYRINGE | INTRAVENOUS | Status: DC | PRN
  Administered 2019-12-29: 80 mg via INTRAVENOUS

## 2019-12-29 MED ORDER — PROPOFOL 500 MG/50ML IV EMUL
INTRAVENOUS | Status: DC | PRN
  Administered 2019-12-29: 135 ug/kg/min via INTRAVENOUS

## 2019-12-29 SURGICAL SUPPLY — 15 items

## 2019-12-29 NOTE — Progress Notes (Signed)
HEMATOLOGY-ONCOLOGY PROGRESS NOTE  SUBJECTIVE: The patient underwent EGD this morning, and a CT simulation for radiation treatment this afternoon.  He has been quite somnolent today, due to the anesthesia and sedation.  Wife is at bedside.   Oncology History Overview Note  Cancer Staging No matching staging information was found for the patient.    Esophageal cancer, stage IV (Andrew Warren)  11/24/2019 Tumor Marker   CA 19-9 - 264.18   11/28/2019 Imaging   CT CAP 11/28/19  1. Minimal coronary artery calcifications are noted suggesting  coronary artery disease.  2. 14 mm nodular density is noted in right upper lobe with central  calcification and is most consistent with benign granuloma or  hamartoma. 3 mm nodule is noted anteriorly in the left upper lobe.  Follow-up unenhanced chest CT in 12 months is recommended to ensure  stability and rule out neoplasm.  3. Small scattered calcifications are seen throughout the pancreas  suggesting chronic pancreatitis.  4. Diverticulosis of descending and sigmoid colon is noted without  inflammation.  5. 2.6 cm low density is seen involving anterior portion of midpole  of right kidney most consistent with cyst, but renal ultrasound is  recommended for confirmation and to rule out neoplasm.  Aortic Atherosclerosis (ICD10-I70.0).    12/12/2019 PET scan   PET 12/12/19  IMPRESSION: 1. Widespread metastatic disease, including to bones, liver, thoracoabdominal nodes, left adrenal gland. The primary is presumably an area of distal esophageal wall thickening and hypermetabolism, given the clinical history. 2. Incidental findings, including: Aortic atherosclerosis (ICD10-I70.0), coronary artery atherosclerosis and emphysema (ICD10-J43.9). Pulmonary artery enlargement suggests pulmonary arterial hypertension.   12/18/2019 Imaging   US Abdomen 12/18/19  1. Multiple hepatic hypoechoic lesions concerning for metastatic  disease. Further characterization with  MRI without and with contrast  recommended.  2. Bilateral renal parenchyma atrophy and increased echogenicity,  likely chronic kidney disease. No hydronephrosis or shadowing stone.  3. Echogenic lesion along the posterior mid to lower pole of the  left kidney, indeterminate. Attention on MRI recommended.     12/26/2019 Initial Diagnosis   Esophageal cancer, stage IV ([facility])      REVIEW OF SYSTEMS:   Back pain reported but unable to obtain a comprehensive review of systems.  I have reviewed the past medical history, past surgical history, social history and family history with the patient and they are unchanged from previous note.   PHYSICAL EXAMINATION: ECOG PERFORMANCE STATUS: 3 - Symptomatic, >50% confined to bed  Vitals:   12/16/2019 1518 12/13/2019 1528  BP: 119/82   Pulse: (!) 108 (!) 103  Resp: 17 16  Temp: 99.6 F (37.6 C)   SpO2: 98% 99%   Filed Weights   12/12/2019 1700 12/28/19 0508  Weight: 203 lb 12.8 oz (92.4 kg) 206 lb 2.1 oz (93.5 kg)    Intake/Output from previous day: 11/18 0701 - 11/19 0700 In: 733.6 [P.O.:400; I.V.:333.6] Out: 4000 [Urine:4000]  GENERAL: drowsy and somnolent, open eyes  SKIN: skin color, texture, turgor are normal, no rashes or significant lesions EYES: normal, Conjunctiva are pink and non-injected, sclera clear LUNGS: clear to auscultation and percussion with normal breathing effort HEART: regular rate & rhythm and no murmurs and no lower extremity edema ABDOMEN:abdomen soft, non-tender and normal bowel sounds Musculoskeletal:no cyanosis of digits and no clubbing  NEURO: Alert, oriented to person  LABORATORY DATA:  I have reviewed the data as listed CMP Latest Ref Rng & Units 12/22/2019 12/28/2019 12/23/2019  Glucose 70 - 99 mg/dL 123(H)  96 92  BUN 8 - 23 mg/dL 34(H) 45(H) 51(H)  Creatinine 0.61 - 1.24 mg/dL 1.65(H) 1.77(H) 1.89(H)  Sodium 135 - 145 mmol/L 145 140 137  Potassium 3.5 - 5.1 mmol/L 5.1 5.1 4.8  Chloride 98 - 111  mmol/L 108 103 101  CO2 22 - 32 mmol/L 27 26 25   Calcium 8.9 - 10.3 mg/dL 10.6(H) 10.5(H) 10.9(H)  Total Protein 6.5 - 8.1 g/dL 6.1(L) 6.3(L) 6.9  Total Bilirubin 0.3 - 1.2 mg/dL 0.3 0.4 0.5  Alkaline Phos 38 - 126 U/L 96 88 100  AST 15 - 41 U/L 21 22 19   ALT 0 - 44 U/L 13 12 12     Lab Results  Component Value Date   WBC 12.1 (H) 01/07/2020   HGB 11.5 (L) 12/28/2019   HCT 37.5 (L) 12/14/2019   MCV 88.0 12/21/2019   PLT 213 12/27/2019   NEUTROABS 8.3 (H) 12/17/2019    MR CERVICAL SPINE WO CONTRAST  Result Date: 12/31/2019 CLINICAL DATA:  Initial evaluation for concern for metastatic disease, incontinence, back pain. EXAM: MRI CERVICAL SPINE WITHOUT CONTRAST TECHNIQUE: Multiplanar, multisequence MR imaging of the cervical spine was performed. No intravenous contrast was administered. COMPARISON:  Prior PET-CT from 12/12/2019 FINDINGS: Alignment: Examination moderately degraded by motion artifact. Straightening of the normal cervical lordosis.  No listhesis. Vertebrae: Scattered osseous metastatic disease seen involving the cervical spine, with definite involvement of the C7, T1, T2, and T3 vertebral bodies. Abnormal signal intensity involving the C5 and C6 vertebral bodies could be related to metastatic disease or possibly reactive degenerative change. Metastatic lesions noted involving the occipital condyles bilaterally. Abnormal edema centered about the right C7-T1 facet likely reflects osseous metastatic disease as well (series 11, image 4). Probable extraosseous extension into the adjacent soft tissues at this level (series 12, image 29) no other definite or convincing extraosseous extension seen on this motion degraded exam. No appreciable epidural involvement. Vertebral body height maintained without pathologic fracture. Cord: Signal intensity within the cervical spinal cord is grossly within normal limits. No convincing cord signal abnormality on this motion degraded exam. Posterior  Fossa, vertebral arteries, paraspinal tissues: Retro cerebellar cyst versus mega cisterna magna noted at the visualized posterior fossa. Craniocervical junction within normal limits. Paraspinous soft tissues otherwise unremarkable. Normal flow voids seen within the vertebral arteries bilaterally. Disc levels: C2-C3: Negative interspace. Moderate right with mild left facet degeneration. No significant canal or foraminal stenosis. C3-C4: Minimal disc bulge. Mild to moderate right with mild left facet hypertrophy. No significant spinal stenosis. Foramina remain patent. C4-C5: Shallow central disc osteophyte complex indents the ventral thecal sac. Mild spinal stenosis without significant cord deformity. Foramina remain patent. C5-C6: Broad-based posterior disc osteophyte complex flattens and indents the ventral thecal sac, slightly eccentric to the left. Resultant mild spinal stenosis with mild flattening of the left hemi cord. No definite cord signal changes. Superimposed right greater than left uncovertebral hypertrophy with no more than mild bilateral C6 foraminal narrowing. C6-C7: Degenerative intervertebral disc space narrowing with diffuse disc osteophyte. Mild flattening of the ventral thecal sac without significant spinal stenosis. Right greater than left uncovertebral spurring with resultant moderate right C7 foraminal stenosis. No significant left foraminal narrowing. C7-T1: Minimal disc bulge. Osseous metastatic disease involves the right C7-T1 facet. Probable involvement of the adjacent right T1 transverse process and posterior right first rib (series 12, images 29, 30). No significant epidural extension at this time. Spinal canal remains patent. Neural foramina remain grossly patent as well. IMPRESSION: 1. Scattered osseous  metastatic disease involving the cervical spine as above. No significant epidural extension at this time. No pathologic fracture or other complication. 2. Metastatic deposit centered  about the right C7-T1 facet with associated extraosseous extension into the surrounding soft tissues as above. No other significant extra osseous disease identified on this motion degraded exam. 3. Moderate multilevel cervical spondylosis as above, with resultant mild spinal stenosis at C4-5 through C6-7. Moderate right C7 foraminal narrowing related to disc osteophyte and uncovertebral disease. Electronically Signed   By: Jeannine Boga M.D.   On: 12/16/2019 21:59   MR THORACIC SPINE WO CONTRAST  Result Date: 12/16/2019 CLINICAL DATA:  Metastatic esophageal cancer EXAM: MRI THORACIC SPINE WITHOUT CONTRAST TECHNIQUE: Multiplanar, multisequence MR imaging of the thoracic spine was performed. No intravenous contrast was administered. COMPARISON:  None. FINDINGS: Alignment:  Anteroposterior alignment is maintained. Vertebrae: STIR hyperintense lesions throughout the thoracic spine reflecting metastatic disease with involvement of posterior elements. No acute compression deformity. No significant epidural extension. Cord:  Normal caliber and signal. Paraspinal and other soft tissues: Unremarkable. Disc levels: Mild multilevel degenerative disc disease and facet arthropathy. There is no high-grade degenerative stenosis. IMPRESSION: Diffuse osseous metastatic disease. No acute compression deformity or significant epidural extension. Electronically Signed   By: Macy Mis M.D.   On: 01/02/2020 14:37   MR LUMBAR SPINE WO CONTRAST  Result Date: 12/11/2019 CLINICAL DATA:  Initial evaluation for back pain, incontinence, metastatic disease evaluation. EXAM: MRI LUMBAR SPINE WITHOUT CONTRAST TECHNIQUE: Multiplanar, multisequence MR imaging of the lumbar spine was performed. No intravenous contrast was administered. COMPARISON:  Prior PET-CT from 12/12/2019 FINDINGS: Segmentation: Standard. Lowest well-formed disc space labeled the L5-S1 level. Alignment: 2 mm retrolisthesis of L2 on L3. Alignment otherwise  normal with preservation of the normal lumbar lordosis. Vertebrae: Extensive diffuse osseous metastases seen throughout the visualized thoracolumbar spine as well as the sacrum and pelvis. There is involvement of the centrally all levels. For reference purposes, largest of these lesions involves the left anterior aspect of the L2 vertebral body and measures approximately 3.2 cm in greatest dimension (series 11, image 11). Vertebral body height maintained without pathologic fracture. There is early extraosseous extension at the level of the sacrum/S1 segment, with tumor encroaching upon the descending S1 nerve roots bilaterally, right greater than left (series 13, image 34). Slight mass effect on the descending right S1 nerve root which is somewhat flattened and displaced posteriorly. No overt neural impingement. No other significant extra osseous extension of tumor seen elsewhere within the lumbar spine at this time. No other significant epidural extension. Conus medullaris and cauda equina: Conus extends to the T12-L1 level. Conus and cauda equina appear normal. No evidence for compression of the distal cord or nerve roots of the cauda equina. Paraspinal and other soft tissues: Left periaortic/retroperitoneal lymph nodes measuring up to 9 mm noted (series 12, image 5), nonspecific, but could reflect nodal metastases. Atherosclerotic change noted within the visualized aorta. Multiple scattered cystic lesions noted about the kidneys, several of which demonstrate intrinsic T1 hyperintensity within the right kidney (series 13, image 10). Findings are indeterminate, and could reflect proteinaceous and/or hemorrhagic cyst. These are not well assessed on this noncontrast examination. Paraspinous soft tissues demonstrate no other acute finding. Disc levels: T12-L1: Mild diffuse disc bulge with disc desiccation. Chronic endplate Schmorl's node deformities. No significant spinal stenosis. Foramina remain patent. L1-2: Mild  disc bulge with disc desiccation. Chronic endplate Schmorl's node deformity. Mild facet hypertrophy. No spinal stenosis. Foramina remain patent. L2-3: Trace  retrolisthesis. Degenerative intervertebral disc space narrowing with mild diffuse disc bulge and disc desiccation. Mild left greater than right facet hypertrophy. Resultant mild narrowing of the lateral recesses bilaterally, left greater than right. Central canal remains patent. No significant foraminal encroachment. L3-4: Disc desiccation with mild disc bulge. Central annular fissure. Mild bilateral facet hypertrophy. Resultant mild lateral recess narrowing bilaterally. Central canal remains patent. No significant foraminal encroachment. L4-5: Disc desiccation with mild diffuse disc bulge. Central annular fissure. Mild facet and ligament flavum hypertrophy. Resultant mild narrowing of the right lateral recess. Foramina remain patent. L5-S1: Disc desiccation without significant disc bulge. Severe right with moderate left facet degeneration. Mild epidural lipomatosis. No significant canal or foraminal stenosis. IMPRESSION: 1. Extensive diffuse osseous metastases throughout the visualized thoracolumbar spine and pelvis. No pathologic fracture. 2. Early extraosseous extension of tumor at the level of the sacrum/S1 segment, with tumor encroaching upon the descending S1 nerve roots bilaterally, right greater than left. Slight mass effect on the descending right S1 nerve root without overt neural impingement. 3. No other significant extra osseous extension or epidural tumor elsewhere within the lumbar spine. No cord or cauda equina compression. 4. Left periaortic/retroperitoneal lymph nodes measuring up to 9 mm, nonspecific, but could reflect nodal metastases. 5. Mild multilevel degenerative spondylosis with resultant mild lateral recess narrowing bilaterally at L2-3 and L3-4, and on the right at L4-5. Electronically Signed   By: Jeannine Boga M.D.   On:  12/25/2019 21:46   NM PET Image Initial (PI) Skull Base To Thigh  Result Date: 12/12/2019 CLINICAL DATA:  Initial treatment strategy for neoplasm of esophagus. Bone metastasis. COVID vaccine in left arm 5/21 EXAM: NUCLEAR MEDICINE PET SKULL BASE TO THIGH TECHNIQUE: 11.1 mCi F-18 FDG was injected intravenously. Full-ring PET imaging was performed from the skull base to thigh after the radiotracer. CT data was obtained and used for attenuation correction and anatomic localization. Fasting blood glucose: 89 mg/dl COMPARISON:  11/20/2019 chest abdomen and pelvic CTs. FINDINGS: Mediastinal blood pool activity: SUV max 3.3 Liver activity: SUV max NA NECK: Laryngeal hypermetabolism is favored to be physiologic, without well-defined CT correlate. No cervical nodal hypermetabolism. Incidental CT findings: No cervical adenopathy. CHEST: Small hypermetabolic mediastinal nodes, including a prevascular node of 7 mm and a S.U.V. max of 12.4 on 63/4. Distal esophageal hypermetabolism with mild wall thickening, including at a S.U.V. max of 18.6 on 97/4. Incidental CT findings: Aortic and coronary artery atherosclerosis. Pulmonary artery enlargement, outflow tract 3.3 cm. Centrilobular and paraseptal emphysema. Central right upper lobe 1.3 cm pulmonary nodule is again favored to represent a hamartoma or granuloma, given central calcification. Not hypermetabolic. ABDOMEN/PELVIS: Bilateral hepatic metastasis which are relatively CT occult. Example in the anterior left hepatic lobe at a S.U.V. max of 13.3 and within the central high right hepatic lobe at a S.U.V. max of 12.9. Abdominal nodal metastasis, with index portacaval node measuring 1.1 cm and a S.U.V. max of 15.9 on 112/4. Multifocal right-sided colonic hypermetabolism is favored to be physiologic. Left adrenal hypermetabolic nodule measures 1.1 cm and a S.U.V. max of 15.0 on 108/4 Incidental CT findings: Extensive colonic diverticulosis. Cholecystectomy. Abdominal aortic  atherosclerosis. Nonaneurysmal dilatation at 2.8 cm. SKELETON: Widespread osseous metastasis. The previously described left scapular/coracoid lesion measures 4.5 cm and a S.U.V. max of 21.0 on 37/4. A sacral lytic lesion measures a S.U.V. max of 16.7. Incidental CT findings: none IMPRESSION: 1. Widespread metastatic disease, including to bones, liver, thoracoabdominal nodes, left adrenal gland. The primary is presumably an area  of distal esophageal wall thickening and hypermetabolism, given the clinical history. 2. Incidental findings, including: Aortic atherosclerosis (ICD10-I70.0), coronary artery atherosclerosis and emphysema (ICD10-J43.9). Pulmonary artery enlargement suggests pulmonary arterial hypertension. Electronically Signed   By: Abigail Miyamoto M.D.   On: 12/12/2019 09:20   DG CHEST PORT 1 VIEW  Result Date: 01/06/2020 CLINICAL DATA:  Metastatic cancer EXAM: PORTABLE CHEST 1 VIEW COMPARISON:  03/26/2014 FINDINGS: Calcified granuloma in the right mid lung. Heart is normal size. No confluent opacities or effusions. No acute bony abnormality. IMPRESSION: No active disease. Electronically Signed   By: Rolm Baptise M.D.   On: 12/24/2019 20:25    ASSESSMENT AND PLAN: 1.  Diffuse metastatic cancer to the bones, liver, nodes, left adrenal gland, likely metastatic esophageal cancer 2.  Acute metabolic encephalopathy 3.  Mild anemia 4.  AKI 5.  Hypercalcemia 6.  Pain secondary to bone metastasis 7.  Bipolar disorder 8.  Diabetes mellitus 9.  Rheumatoid arthritis  -EGD showed a large, ulcerating mass with bleeding in the distal esophagus, biopsy was taken, pathology is pending.  This is likely the primary esophageal cancer. The path will be back on Monday morning -I called interventional radiology to hold on his liver biopsy for now, will cancel it if esophageal biopsy confirmed malignancy. -I have referred him to radiation oncology, for palliative radiation to the spine and possible left  shoulder. -I also placed palliative consult for symptom management and GOC discussion  -I discussed the above with patient's wife at the bedside.  Patient will unlikely be a candidate for intensive chemotherapy, but I would consider low intensity chemo and immunotherapy, if his pain is well controlled and his overall performance status improved some.  This will likely start in 2 to 3 weeks, after he completes palliative radiation  -will request molecular testing on his biopsy sample next week -I will f/u on Monday   I will follow up.   Truitt Merle  12/29/2019

## 2019-12-29 NOTE — Progress Notes (Signed)
SLP Cancellation Note  Patient Details Name: Andrew Warren MRN: [id number] DOB: 04/08/1950   Cancelled treatment:       Reason Eval/Treat Not Completed: Patient at procedure or test/unavailable - Per RN, pt is leaving for more tests at this time. Will continue efforts.  Ramie Palladino B. Quentin Ore, [facility], [place] Speech Language Pathologist Office: [phone] Pager: [phone]  Shonna Chock 12/13/2019, 1:35 PM

## 2019-12-29 NOTE — Progress Notes (Signed)
SLP Cancellation Note  Patient Details Name: Andrew Warren MRN: [id number] DOB: August 09, 1950   Cancelled treatment:       Reason Eval/Treat Not Completed: Patient at procedure or test/unavailable. Will continue efforts to complete BSE.  Andrew Warren B. Quentin Ore, [facility], [place] Speech Language Pathologist Office: [phone] Pager: [phone]  Andrew Warren 01/07/2020, 9:31 AM

## 2019-12-29 NOTE — Anesthesia Preprocedure Evaluation (Signed)
Anesthesia Evaluation  Patient identified by MRN, date of birth, ID band Patient awake    Reviewed: Allergy & Precautions, NPO status , Patient's Chart, lab work & pertinent test results  Airway Mallampati: I       Dental  (+) Edentulous Upper, Edentulous Lower   Pulmonary former smoker,    Pulmonary exam normal        Cardiovascular negative cardio ROS   Rhythm:Regular Rate:Tachycardia     Neuro/Psych PSYCHIATRIC DISORDERS Bipolar Disorder negative neurological ROS     GI/Hepatic Neg liver ROS,   Endo/Other  diabetes, Type 2, Oral Hypoglycemic Agents  Renal/GU Renal InsufficiencyRenal disease     Musculoskeletal   Abdominal (+) + obese,   Peds  Hematology  (+) anemia ,   Anesthesia Other Findings   Reproductive/Obstetrics                             Anesthesia Physical Anesthesia Plan  ASA: III  Anesthesia Plan: MAC   Post-op Pain Management:    Induction:   PONV Risk Score and Plan:   Airway Management Planned: Mask and Natural Airway  Additional Equipment: None  Intra-op Plan:   Post-operative Plan:   Informed Consent: I have reviewed the patients History and Physical, chart, labs and discussed the procedure including the risks, benefits and alternatives for the proposed anesthesia with the patient or authorized representative who has indicated his/her understanding and acceptance.       Plan Discussed with: CRNA  Anesthesia Plan Comments:         Anesthesia Quick Evaluation

## 2019-12-29 NOTE — Progress Notes (Signed)
PROGRESS NOTE    Andrew Warren  FHL:[id number] DOB: Nov 01, 1950 DOA: 12/26/2019 PCP: [name], MD   No chief complaint on file.  Brief Narrative:  Andrew Warren is Andrew Warren 69 y.o. male with medical history significant of bipolar disorder, HTN, HLD, and metastatic cancer who presents to the hospital after Andrew Warren clinic visit.    History is obtained from patient and the medical record.  Limited from patient due to encephalopathy.  Wife not at bedside and unable to be reached by phone.  Per oncology note, he was seen by orthopedics outpatient who diagnosed cancer via MRI and he was referred to oncology in early October 10/21.  He has not had any definitive diagnosis with biopsy yet.  He's taking morphine for pain in his back, chest and upper abdomen.  He's noticed recent urinary frequency and incontinence over the past 2 days.  He hasn't had Andrew Warren BM in weeks.  He's slower to speak and respond since heing on morphine.  His wife helps him ambulate, use the bathroom over the past week.  Seems like he's been weaker over the apst several weeks.  He notes he's constipated and has issues urinating.  C/o numbness to groin.  He's unable to clearly describe timing.  He complains of pain, but isn't able to describe this well.  No smoking or etoh per his report.   Assessment & Plan:   Active Problems:   Metastatic cancer ([place])   Esophageal mass   Abnormal gastrointestinal positron emission tomography (PET) scan  Saddle Anesthesia  Incontinence  Constipation  Concern for Cord Compression in setting of metastatic disease:  Follow MRI C/T/L spine without contrast given CKD MRI c spine with scattered osseous metastatic dz involving the cervical spine (see report) MRI with extensive diffuse osseous mets throughout visualized thoracolumbar spine and pelvis.  Early extraosseous extension of tumor at level of the sacrum /S1 segment with tumor encroaching upon the descending S1 nerve roots bilaterally, R > L.   Slight mass effect on the descending R S1 nerve root without overt neural impingement.  No cord or cauda equina compression.  (see report)  thoracic MRI with diffuse osseous metastatic disease MRI brain canceled due to inability to tolerate Per Dr. 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  The primary is presumably an area of distal esophageal wall thickening and hypermetabolism given the clinical hx.  See report. 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  Will need to discuss additionally with wife and/or pt when mental status improves.  DVT prophylaxis: SCD Code Status: full   Family Communication: none at bedside Disposition:   Status is: Inpatient  Remains inpatient appropriate because:Inpatient level of care appropriate due to severity of illness   Dispo: The patient is from: Home              Anticipated d/c is to: pending              Anticipated d/c date is: > 3 days              Patient currently is not medically stable to d/c.  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 Biopsied. - Gastritis. Biopsied. - Erythematous duodenopathy. Biopsied. - Normal second portion of the duodenum. Recommendations Soft diet. - Continue present medications. - Await pathology results. - Follow up with Oncology - Use Protonix (pantoprazole) 40 mg PO BID. - Use sucralfate suspension 1 gram PO QID. - Called his wife and discussed endoscopic findings - GI is available if needed, please call with any questions  Antimicrobials:  Anti-infectives (From admission, onward)   None        Subjective: No new complaints today Somnolent after procedure and mri with ativan  Objective: Vitals:   01/04/2020 1000 12/24/2019 1045 12/18/2019 1518 12/28/2019 1528  BP: 108/70 111/73 119/82   Pulse: 96 99 (!) 108 (!) 103  Resp: (!) 22 15 17 16   Temp:  98.4 F (36.9 C) 99.6 F (37.6 C)   TempSrc:  Oral Oral   SpO2: 99% 91% 98% 99%  Weight:      Height:        Intake/Output Summary (Last 24 hours) at 12/17/2019 1846 Last data filed at 12/22/2019 1321 Gross per 24 hour  Intake 500 ml  Output 3250 ml  Net -2750 ml   Filed Weights   01/06/2020 1700 12/28/19 0508  Weight: 92.4 kg 93.5 kg    Examination:  General: No acute distress. Cardiovascular: Heart sounds show Andrew Warren regular rate, and rhythm. Lungs: Clear to auscultation bilaterally, snoring respirations Abdomen: Soft, nontender, nondistended Neurological: disoriented, somnolent, awakens to voice Moves all extremities  4 with equal strength. Cranial nerves II through XII grossly intact. Skin: Warm and dry. No rashes or lesions. Extremities: No clubbing or cyanosis. No edema.   Data Reviewed: I have personally reviewed following labs and imaging studies  CBC: Recent Labs  Lab 12/12/2019 1748 12/28/19 0555 01/05/2020 0714  WBC 13.1* 12.0* 12.1*  NEUTROABS 9.6*  --  8.3*  HGB 11.9* 11.7* 11.5*  HCT 38.7* 38.5* 37.5*  MCV 88.4 89.7 88.0  PLT 256 224 604    Basic Metabolic Panel: Recent Labs  Lab 12/26/2019 1748 12/28/19 0555 01/03/2020 0714  NA 137 140 145  K 4.8 5.1 5.1  CL 101 103 108  CO2 25 26 27   GLUCOSE 92 96 123*  BUN 51* 45* 34*  CREATININE 1.89* 1.77* 1.65*  CALCIUM 10.9* 10.5* 10.6*  MG 2.5*  --  2.3  PHOS 4.4  --  3.7    GFR: Estimated Creatinine Clearance: 47.5 mL/min (Abass Misener) (by C-G formula based  on SCr of 1.65 mg/dL (H)).  Liver Function Tests: Recent Labs  Lab 12/15/2019 1748 12/28/19 0555 12/14/2019 0714  AST 19 22 21   ALT 12 12 13   ALKPHOS 100 88 96  BILITOT 0.5 0.4 0.3  PROT 6.9 6.3* 6.1*  ALBUMIN 3.3* 2.9* 2.8*    CBG: Recent Labs  Lab 12/13/2019 0733 12/28/2019 0859 01/04/2020 1153 12/15/2019 1247 12/14/2019 1714  GLUCAP 170* 158* 68* 123* 75     Recent Results (from the past 240 hour(s))  Respiratory Panel by RT PCR (Flu Elkin Belfield&B, Covid) - Nasopharyngeal Swab     Status: None   Collection Time: 01/07/2020  5:20 PM   Specimen: Nasopharyngeal Swab  Result Value Ref Range Status   SARS Coronavirus 2 by RT PCR NEGATIVE NEGATIVE Final    Comment: (NOTE) SARS-CoV-2 target nucleic acids are NOT DETECTED.  The SARS-CoV-2 RNA is generally detectable in upper respiratoy specimens during the acute phase of infection. The lowest concentration of SARS-CoV-2 viral copies this assay can detect is 131 copies/mL. Sherrian Nunnelley negative result does not preclude SARS-Cov-2 infection and should not be used as the sole basis for treatment or other patient management decisions. Kailly Richoux negative result  may occur with  improper specimen collection/handling, submission of specimen other than nasopharyngeal swab, presence of viral mutation(s) within the areas targeted by this assay, and inadequate number of viral copies (<131 copies/mL). Nasia Cannan negative result must be combined with clinical observations, patient history, and epidemiological information. The expected result is Negative.  Fact Sheet for Patients:  PinkCheek.be  Fact Sheet for Healthcare Providers:  GravelBags.it  This test is no t yet approved or cleared by the [place] FDA and  has been authorized for detection and/or diagnosis of SARS-CoV-2 by FDA under an Emergency Use Authorization (EUA). This EUA will remain  in effect (meaning this test can be used) for the duration of the COVID-19 declaration under Section 564(b)(1) of the Act, 21 U.S.C. section 360bbb-3(b)(1), unless the authorization is terminated or revoked sooner.     Influenza Catlin Aycock by PCR NEGATIVE NEGATIVE Final   Influenza B by PCR NEGATIVE NEGATIVE Final    Comment: (NOTE) The Xpert Xpress SARS-CoV-2/FLU/RSV assay is intended as an aid in  the diagnosis of influenza from Nasopharyngeal swab specimens and  should not be used as Ferry Matthis sole basis for treatment. Nasal washings and  aspirates are unacceptable for Xpert Xpress SARS-CoV-2/FLU/RSV  testing.  Fact Sheet for Patients: PinkCheek.be  Fact Sheet for Healthcare Providers: GravelBags.it  This test is not yet approved or cleared by the [place] FDA and  has been authorized for detection and/or diagnosis of SARS-CoV-2 by  FDA under an Emergency Use Authorization (EUA). This EUA will remain  in effect (meaning this test can be used) for the duration of the  Covid-19 declaration under Section 564(b)(1) of the Act, 21  U.S.C. section 360bbb-3(b)(1), unless the authorization is  terminated  or revoked. Performed at [facility], [address], [place], [place] [address]          Radiology Studies: MR CERVICAL SPINE WO CONTRAST  Result Date: 01/02/2020 CLINICAL DATA:  Initial evaluation for concern for metastatic disease, incontinence, back pain. EXAM: MRI CERVICAL SPINE WITHOUT CONTRAST TECHNIQUE: Multiplanar, multisequence MR imaging of the cervical spine was performed. No intravenous contrast was administered. COMPARISON:  Prior PET-CT from 12/12/2019 FINDINGS: Alignment: Examination moderately degraded by motion artifact. Straightening of the normal cervical lordosis.  No listhesis. Vertebrae: Scattered osseous metastatic disease seen involving  the cervical spine, with definite involvement of the C7, T1, T2, and T3 vertebral bodies. Abnormal signal intensity involving the C5 and C6 vertebral bodies could be related to metastatic disease or possibly reactive degenerative change. Metastatic lesions noted involving the occipital condyles bilaterally. Abnormal edema centered about the right C7-T1 facet likely reflects osseous metastatic disease as well (series 11, image 4). Probable extraosseous extension into the adjacent soft tissues at this level (series 12, image 29) no other definite or convincing extraosseous extension seen on this motion degraded exam. No appreciable epidural involvement. Vertebral body height maintained without pathologic fracture. Cord: Signal intensity within the cervical spinal cord is grossly within normal limits. No convincing cord signal abnormality on this motion degraded exam. Posterior Fossa, vertebral arteries, paraspinal tissues: Retro cerebellar cyst versus mega cisterna magna noted at the visualized posterior fossa. Craniocervical junction within normal limits. Paraspinous soft tissues otherwise unremarkable. Normal flow voids seen within the vertebral arteries bilaterally. Disc levels: C2-C3: Negative interspace. Moderate right  with mild left facet degeneration. No significant canal or foraminal stenosis. C3-C4: Minimal disc bulge. Mild to moderate right with mild left facet hypertrophy. No significant spinal stenosis. Foramina remain patent. C4-C5: Shallow central disc osteophyte complex indents the ventral thecal sac. Mild spinal stenosis without significant cord deformity. Foramina remain patent. C5-C6: Broad-based posterior disc osteophyte complex flattens and indents the ventral thecal sac, slightly eccentric to the left. Resultant mild spinal stenosis with mild flattening of the left hemi cord. No definite cord signal changes. Superimposed right greater than left uncovertebral hypertrophy with no more than mild bilateral C6 foraminal narrowing. C6-C7: Degenerative intervertebral disc space narrowing with diffuse disc osteophyte. Mild flattening of the ventral thecal sac without significant spinal stenosis. Right greater than left uncovertebral spurring with resultant moderate right C7 foraminal stenosis. No significant left foraminal narrowing. C7-T1: Minimal disc bulge. Osseous metastatic disease involves the right C7-T1 facet. Probable involvement of the adjacent right T1 transverse process and posterior right first rib (series 12, images 29, 30). No significant epidural extension at this time. Spinal canal remains patent. Neural foramina remain grossly patent as well. IMPRESSION: 1. Scattered osseous metastatic disease involving the cervical spine as above. No significant epidural extension at this time. No pathologic fracture or other complication. 2. Metastatic deposit centered about the right C7-T1 facet with associated extraosseous extension into the surrounding soft tissues as above. No other significant extra osseous disease identified on this motion degraded exam. 3. Moderate multilevel cervical spondylosis as above, with resultant mild spinal stenosis at C4-5 through C6-7. Moderate right C7 foraminal narrowing related to  disc osteophyte and uncovertebral disease. Electronically Signed   By: Jeannine Boga M.D.   On: 12/25/2019 21:59   MR THORACIC SPINE WO CONTRAST  Result Date: 12/12/2019 CLINICAL DATA:  Metastatic esophageal cancer EXAM: MRI THORACIC SPINE WITHOUT CONTRAST TECHNIQUE: Multiplanar, multisequence MR imaging of the thoracic spine was performed. No intravenous contrast was administered. COMPARISON:  None. FINDINGS: Alignment:  Anteroposterior alignment is maintained. Vertebrae: STIR hyperintense lesions throughout the thoracic spine reflecting metastatic disease with involvement of posterior elements. No acute compression deformity. No significant epidural extension. Cord:  Normal caliber and signal. Paraspinal and other soft tissues: Unremarkable. Disc levels: Mild multilevel degenerative disc disease and facet arthropathy. There is no high-grade degenerative stenosis. IMPRESSION: Diffuse osseous metastatic disease. No acute compression deformity or significant epidural extension. Electronically Signed   By: Macy Mis M.D.   On: 01/03/2020 14:37   MR LUMBAR SPINE WO CONTRAST  Result Date:  12/23/2019 CLINICAL DATA:  Initial evaluation for back pain, incontinence, metastatic disease evaluation. EXAM: MRI LUMBAR SPINE WITHOUT CONTRAST TECHNIQUE: Multiplanar, multisequence MR imaging of the lumbar spine was performed. No intravenous contrast was administered. COMPARISON:  Prior PET-CT from 12/12/2019 FINDINGS: Segmentation: Standard. Lowest well-formed disc space labeled the L5-S1 level. Alignment: 2 mm retrolisthesis of L2 on L3. Alignment otherwise normal with preservation of the normal lumbar lordosis. Vertebrae: Extensive diffuse osseous metastases seen throughout the visualized thoracolumbar spine as well as the sacrum and pelvis. There is involvement of the centrally all levels. For reference purposes, largest of these lesions involves the left anterior aspect of the L2 vertebral body and  measures approximately 3.2 cm in greatest dimension (series 11, image 11). Vertebral body height maintained without pathologic fracture. There is early extraosseous extension at the level of the sacrum/S1 segment, with tumor encroaching upon the descending S1 nerve roots bilaterally, right greater than left (series 13, image 34). Slight mass effect on the descending right S1 nerve root which is somewhat flattened and displaced posteriorly. No overt neural impingement. No other significant extra osseous extension of tumor seen elsewhere within the lumbar spine at this time. No other significant epidural extension. Conus medullaris and cauda equina: Conus extends to the T12-L1 level. Conus and cauda equina appear normal. No evidence for compression of the distal cord or nerve roots of the cauda equina. Paraspinal and other soft tissues: Left periaortic/retroperitoneal lymph nodes measuring up to 9 mm noted (series 12, image 5), nonspecific, but could reflect nodal metastases. Atherosclerotic change noted within the visualized aorta. Multiple scattered cystic lesions noted about the kidneys, several of which demonstrate intrinsic T1 hyperintensity within the right kidney (series 13, image 10). Findings are indeterminate, and could reflect proteinaceous and/or hemorrhagic cyst. These are not well assessed on this noncontrast examination. Paraspinous soft tissues demonstrate no other acute finding. Disc levels: T12-L1: Mild diffuse disc bulge with disc desiccation. Chronic endplate Schmorl's node deformities. No significant spinal stenosis. Foramina remain patent. L1-2: Mild disc bulge with disc desiccation. Chronic endplate Schmorl's node deformity. Mild facet hypertrophy. No spinal stenosis. Foramina remain patent. L2-3: Trace retrolisthesis. Degenerative intervertebral disc space narrowing with mild diffuse disc bulge and disc desiccation. Mild left greater than right facet hypertrophy. Resultant mild narrowing of the  lateral recesses bilaterally, left greater than right. Central canal remains patent. No significant foraminal encroachment. L3-4: Disc desiccation with mild disc bulge. Central annular fissure. Mild bilateral facet hypertrophy. Resultant mild lateral recess narrowing bilaterally. Central canal remains patent. No significant foraminal encroachment. L4-5: Disc desiccation with mild diffuse disc bulge. Central annular fissure. Mild facet and ligament flavum hypertrophy. Resultant mild narrowing of the right lateral recess. Foramina remain patent. L5-S1: Disc desiccation without significant disc bulge. Severe right with moderate left facet degeneration. Mild epidural lipomatosis. No significant canal or foraminal stenosis. IMPRESSION: 1. Extensive diffuse osseous metastases throughout the visualized thoracolumbar spine and pelvis. No pathologic fracture. 2. Early extraosseous extension of tumor at the level of the sacrum/S1 segment, with tumor encroaching upon the descending S1 nerve roots bilaterally, right greater than left. Slight mass effect on the descending right S1 nerve root without overt neural impingement. 3. No other significant extra osseous extension or epidural tumor elsewhere within the lumbar spine. No cord or cauda equina compression. 4. Left periaortic/retroperitoneal lymph nodes measuring up to 9 mm, nonspecific, but could reflect nodal metastases. 5. Mild multilevel degenerative spondylosis with resultant mild lateral recess narrowing bilaterally at L2-3 and L3-4, and on the right  at L4-5. Electronically Signed   By: Jeannine Boga M.D.   On: 01/06/2020 21:46   DG CHEST PORT 1 VIEW  Result Date: 12/19/2019 CLINICAL DATA:  Metastatic cancer EXAM: PORTABLE CHEST 1 VIEW COMPARISON:  03/26/2014 FINDINGS: Calcified granuloma in the right mid lung. Heart is normal size. No confluent opacities or effusions. No acute bony abnormality. IMPRESSION: No active disease. Electronically Signed   By:  Rolm Baptise M.D.   On: 01/03/2020 20:25        Scheduled Meds: . atorvastatin  10 mg Oral q1800  . divalproex  1,500 mg Oral QHS  . feeding supplement  237 mL Oral BID BM  . insulin aspart  0-5 Units Subcutaneous QHS  . insulin aspart  0-9 Units Subcutaneous TID WC  . multivitamin with minerals  1 tablet Oral Daily  . pantoprazole  40 mg Oral BID  . QUEtiapine  25 mg Oral QHS  . sucralfate  1 g Oral TID WC & HS  . tamsulosin  0.4 mg Oral Daily   Continuous Infusions: . dextrose 5 % and 0.9% NaCl 100 mL/hr at 01/04/2020 0053     LOS: 2 days    Time spent: over 30 min    Fayrene Helper, MD [facility]   To contact the attending provider between 7A-7P or the covering provider during after hours 7P-7A, please log into the web site [email or web address] and access using universal [place] password for that web site. If you do not have the password, please call the hospital operator.  12/23/2019, 6:46 PM

## 2019-12-29 NOTE — Interval H&P Note (Signed)
History and Physical Interval Note:  01/04/2020 9:38 AM  Andrew Warren  has presented today for surgery, with the diagnosis of metastatic cancer- r/o esophageal primary.  The various methods of treatment have been discussed with the patient and family. After consideration of risks, benefits and other options for treatment, the patient has consented to  Procedure(s): ESOPHAGOGASTRODUODENOSCOPY (EGD) WITH PROPOFOL (N/A) BIOPSY as a surgical intervention.  The patient's history has been reviewed, patient examined, no change in status, stable for surgery.  I have reviewed the patient's chart and labs.  Questions were answered to the patient's satisfaction.     Ambrielle Kington

## 2019-12-29 NOTE — Op Note (Addendum)
[facility] Patient Name: Andrew Warren Procedure Date: 01/06/2020 MRN: [id number] Attending MD: Mauri Pole , MD Date of Birth: 05-Aug-1950 CSN: [id number] Age: 69 Admit Type: Inpatient Procedure:                Upper GI endoscopy Indications:              Endoscopy to confirm suspected neoplastic lesion of                            the esophagus seen on previous imaging study,                            Abnormal PET scan of the GI tract Providers:                Mauri Pole, MD, Jeanella Cara, RN,                            Tyrone Apple, Technician, Arnoldo Hooker, CRNA Referring MD:              Medicines:                Monitored Anesthesia Care Complications:            No immediate complications. Estimated Blood Loss:     Estimated blood loss was minimal. Procedure:                Pre-Anesthesia Assessment:                           - Prior to the procedure, a History and Physical                            was performed, and patient medications and                            allergies were reviewed. The patient's tolerance of                            previous anesthesia was also reviewed. The risks                            and benefits of the procedure and the sedation                            options and risks were discussed with the patient.                            All questions were answered, and informed consent                            was obtained. Prior Anticoagulants: The patient has                            taken no previous anticoagulant or antiplatelet  agents. ASA Grade Assessment: III - A patient with                            severe systemic disease. After reviewing the risks                            and benefits, the patient was deemed in                            satisfactory condition to undergo the procedure.                           After obtaining informed consent, the  endoscope was                            passed under direct vision. Throughout the                            procedure, the patient's blood pressure, pulse, and                            oxygen saturations were monitored continuously. The                            GIF-H190 ([id number] Olympus gastroscope was                            introduced through the mouth, and advanced to the                            second part of duodenum. The upper GI endoscopy was                            accomplished without difficulty. The patient                            tolerated the procedure well. Scope In: Scope Out: Findings:      A large, ulcerating mass with bleeding was found in the distal       esophagus, arising from EG junction, 40 to 45 cm from the incisors. The       mass was non-obstructing and partially circumferential (involving two       thirds of the lumen circumference). Biopsies were taken with a cold       forceps for histology.      Patchy mild inflammation characterized by congestion (edema), erosions       and erythema was found in the entire examined stomach. Biopsies were       taken with a cold forceps for Helicobacter pylori testing.      The cardia and gastric fundus were normal on retroflexion.      Patchy mildly erythematous mucosa without active bleeding was found in       the duodenal bulb. Biopsies were taken with a cold forceps for histology.      The second portion of the duodenum was normal. 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 Biopsied.                           - Gastritis. Biopsied.                           - Erythematous duodenopathy. Biopsied.                           - Normal second portion of the duodenum. Moderate Sedation:      Not Applicable - Patient had care per Anesthesia. Recommendation:           - Soft diet.                           - Continue present  medications.                           - Await pathology results.                           - Follow up with Oncology                           - Use Protonix (pantoprazole) 40 mg PO BID.                           - Use sucralfate suspension 1 gram PO QID.                           - Called his wife and discussed endoscopic findings                           - GI is available if needed, please call with any                            questions Procedure Code(s):        --- Professional ---                           [phone], Esophagogastroduodenoscopy, flexible,                            transoral; with biopsy, single or multiple Diagnosis Code(s):        --- Professional ---                           D49.0, Neoplasm of unspecified behavior of                            digestive system                           K29.70, Gastritis, unspecified, without bleeding  K31.89, Other diseases of stomach and duodenum                           R93.3, Abnormal findings on diagnostic imaging of                            other parts of digestive tract CPT copyright 2019 [employer]. All rights reserved. The codes documented in this report are preliminary and upon coder review may  be revised to meet current compliance requirements. Mauri Pole, MD 12/19/2019 9:49:29 AM This report has been signed electronically. Number of Addenda: 0

## 2019-12-29 NOTE — Transfer of Care (Signed)
Immediate Anesthesia Transfer of Care Note  Patient: Andrew Warren  Procedure(s) Performed: Procedure(s): ESOPHAGOGASTRODUODENOSCOPY (EGD) WITH PROPOFOL (N/A) BIOPSY  Patient Location: PACU  Anesthesia Type:MAC  Level of Consciousness:  sedated, patient cooperative and responds to stimulation  Airway & Oxygen Therapy:Patient Spontanous Breathing and Patient connected to face mask oxgen  Post-op Assessment:  Report given to PACU RN and Post -op Vital signs reviewed and stable  Post vital signs:  Reviewed and stable  Last Vitals:  Vitals:   12/28/2019 0538 12/17/2019 0844  BP: 117/76 122/77  Pulse: (!) 104 (!) 109  Resp: 17 (!) 21  Temp: 36.7 C 37.1 C  SpO2: 47% 09%    Complications: No apparent anesthesia complications

## 2019-12-29 NOTE — Progress Notes (Signed)
Interventional Radiology Brief Note:  IR planning for liver lesion biopsy today, however note patient s/p EGD with biopsy of esophageal mass today with GI.  Dr. Burr Medico requesting await biopsy results prior to considering further procedures.   Procedure on hold.  Will follow for need based on outcome of pending diagnostic testing.   Brynda Greathouse, MS RD PA-C 10:40 AM

## 2019-12-29 NOTE — Consult Note (Signed)
Radiation Oncology         [phone] ________________________________  Initial inpatient Consultation  Name: Andrew Warren MRN: [id number]  Date of Service: 01/09/2020 DOB: 08-Oct-1950  CC:Selinda Flavin, MD  No ref. provider found   REFERRING PHYSICIAN: No ref. provider found  DIAGNOSIS: 69 y/o male with newly diagnosed widely metastatic cancer with painful metastases to the spine, unknown primary.    ICD-10-CM   1. Metastatic cancer (HCC)  C79.9 DG CHEST PORT 1 VIEW    DG CHEST PORT 1 VIEW  2. Liver mass  R16.0 US BIOPSY (LIVER)    US BIOPSY (LIVER)  3. Cancer (HCC)  C80.1 MR OUTSIDE FILMS EXTREMITY    MR OUTSIDE FILMS EXTREMITY    HISTORY OF PRESENT ILLNESS: Andrew Warren is a 69 y.o. male seen at the request of Dr. Mosetta Putt.  He initially presented to orthopedics in 10/2019 with right shoulder pain s/p injury. He underwent a right shoulder MRI on 11/13/2019 in [facility] showing evidence of metastatic disease to the humeral head and also coracoid and glenoid with several small separate foci consistent with metastatic disease.  He was referred to Dr. Angelene Giovanni at the [facility] on 11/24/2019. Tumor marker CA 19-9 was obtained at that time was 264.18. He subsequently underwent CT C/A/P on 11/28/2019 showing a lytic lesion involving the left scapula at the base of the coracoid process and extending slightly into the glenoid region, measuring 3.0 x 2.2 cm, concerning for malignancy.  This was followed by a PET scan on 12/12/2019 which showed widespread metastatic disease, including to the bones, liver, thoracoabdominal nodes, and the left adrenal gland.  Primary is presumably an area of distal esophageal wall thickening and hypermetabolism, given clinical history.  He subsequently met with Dr. Mosetta Putt on 01/05/2020, who recommended hospital admission to rule out spinal cord compression given progressive, severe low back pain and new onset bowel and bladder dysfunction with known  diffuse metastases throughout the spine as well as to expedite biopsy for definitive primary cancer diagnosis. He was direct admitted to [facility] and underwent lumbar spine MRI which showed extensive, diffuse osseous metastases at essentially all levels, without pathologic fracture. There was early extraosseous extension of tumor at the level of the sacrum/S1 segment, with tumor encroaching upon the descending S1 nerve roots bilaterally, right greater than left, and slight mass effect on the descending S1 right nerve root without overt neural impingement There was no other significant extraosseous extension or epidural tumor and no cord compression. Also noted were nonspecific left periaortic/retroperitoneal lymph nodes measuring up to 9 mm.  A cervical spine MRI performed on admission showed scattered osseous metastatic disease without significant epidural extension, pathologic fracture, or other complication. There is a metastatic deposit centered about the right C7-T1 facet with associated extraosseous extension into surrounding soft tissues.  Earlier this morning, the patient underwent EGD with biopsy under the care of Dr. Lavon Paganini. Pathology from the procedure remains pending.  We have been consulted to discuss the potential for palliative radiotherapy to the spine to assist with more durable pain management.  PREVIOUS RADIATION THERAPY: No  PAST MEDICAL HISTORY:  Past Medical History:  Diagnosis Date   Adenomatous polyps 2004   per H&P in echart by Dr. Lovell Sheehan   Bipolar disorder [facility])    BPH (benign prostatic hyperplasia)    Chronic kidney disease    Diabetes mellitus    Genital warts    High cholesterol    Rheumatoid arthritis(714.0)    S/P  colonoscopy Jan 2008   diverticulosis in sigmoid, Dr. Arnoldo Morale      PAST SURGICAL HISTORY: Past Surgical History:  Procedure Laterality Date   APPENDECTOMY     BIOPSY  03/02/2016   Procedure: BIOPSY;  Surgeon: Daneil Dolin, MD;   [facility];  Service: Endoscopy;;  biopsy evaluate for microscopic colitis   COLONOSCOPY WITH PROPOFOL N/A 03/02/2016   Procedure: COLONOSCOPY WITH PROPOFOL;  Surgeon: Daneil Dolin, MD;  Location: [facility];  Service: Endoscopy;  Laterality: N/A;  10:00am   GALLBLADDER SURGERY  11/2014   POLYPECTOMY  03/02/2016   Procedure: POLYPECTOMY;  Surgeon: Daneil Dolin, MD;  Location: [facility];  Service: Endoscopy;;  cecal polyp   varicocele repair      FAMILY HISTORY:  Family History  Problem Relation Age of Onset   Dementia Mother    Schizophrenia Father    Diabetes Father    Heart disease Maternal Grandfather    Diabetes Maternal Grandfather    Cancer Maternal Grandfather        brain tumor    Heart disease Maternal Grandmother    Diabetes Maternal Grandmother    Heart disease Paternal Grandfather    Diabetes Paternal Grandfather    Heart disease Paternal Grandmother    Diabetes Paternal Grandmother    Seizures Sister    Hypertension Sister    Heart attack Brother    Colon cancer Neg Hx     SOCIAL HISTORY:  Social History   Socioeconomic History   Marital status: Married    Spouse name: Not on file   Number of children: 2   Years of education: Not on file   Highest education level: Not on file  Occupational History   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   Tobacco Use   Smoking status: Former Smoker    Packs/day: 3.00    Years: 30.00    Pack years: 90.00    Types: Cigarettes    Quit date: 08/09/2005    Years since quitting: 14.3   Smokeless tobacco: Never Used  Substance and Sexual Activity   Alcohol use: No   Drug use: No   Sexual activity: Yes  Other Topics Concern   Not on file  Social History Narrative   Not on file   Social Determinants of Health   Financial Resource Strain:    Difficulty of Paying Living Expenses: Not on file  Food Insecurity:    Worried About Charity fundraiser in the Last Year: Not on file     [employer] of Food in the Last Year: Not on file  Transportation Needs:    Lack of Transportation (Medical): Not on file   Lack of Transportation (Non-Medical): Not on file  Physical Activity:    Days of Exercise per Week: Not on file   Minutes of Exercise per Session: Not on file  Stress:    Feeling of Stress : Not on file  Social Connections:    Frequency of Communication with Friends and Family: Not on file   Frequency of Social Gatherings with Friends and Family: Not on file   Attends Religious Services: Not on file   Active Member of Clubs or Organizations: Not on file   Attends Archivist Meetings: Not on file   Marital Status: Not on file  Intimate Partner Violence:    Fear of Current or Ex-Partner: Not on file   Emotionally Abused: Not on file   Physically Abused: Not on file  Sexually Abused: Not on file    ALLERGIES: Penicillins and Lithium  MEDICATIONS:  Current Facility-Administered Medications  Medication Dose Route Frequency Provider Last Rate Last Admin   acetaminophen (TYLENOL) tablet 650 mg  650 mg Oral Q6H PRN Elodia Florence., MD   650 mg at 12/28/19 1328   Or   acetaminophen (TYLENOL) suppository 650 mg  650 mg Rectal Q6H PRN Elodia Florence., MD       atorvastatin (LIPITOR) tablet 10 mg  10 mg Oral q1800 Elodia Florence., MD   10 mg at 12/28/19 1717   dextrose 5 %-0.9 % sodium chloride infusion   Intravenous Continuous Elodia Florence., MD 100 mL/hr at 12/28/2019 0053 New Bag at 12/20/2019 0053   divalproex (DEPAKOTE ER) 24 hr tablet 1,500 mg  1,500 mg Oral QHS Elodia Florence., MD   1,500 mg at 12/28/19 2134   feeding supplement (ENSURE ENLIVE / ENSURE PLUS) liquid 237 mL  237 mL Oral BID BM Elodia Florence., MD   237 mL at 12/28/19 1331   insulin aspart (novoLOG) injection 0-5 Units  0-5 Units Subcutaneous QHS Elodia Florence., MD       insulin aspart (novoLOG) injection 0-9 Units  0-9  Units Subcutaneous TID WC Elodia Florence., MD   2 Units at 12/11/2019 0753   morphine (MSIR) tablet 7.5 mg  7.5 mg Oral Q4H PRN Elodia Florence., MD   7.5 mg at 12/16/2019 1551   multivitamin with minerals tablet 1 tablet  1 tablet Oral Daily Elodia Florence., MD       pantoprazole (PROTONIX) EC tablet 40 mg  40 mg Oral BID Nandigam, Venia Minks, MD       QUEtiapine (SEROQUEL) tablet 25 mg  25 mg Oral QHS Elodia Florence., MD   25 mg at 12/28/19 2134   sucralfate (CARAFATE) 1 GM/10ML suspension 1 g  1 g Oral TID WC & HS Nandigam, Kavitha V, MD       tamsulosin (FLOMAX) capsule 0.4 mg  0.4 mg Oral Daily Elodia Florence., MD   0.4 mg at 12/28/19 1425    REVIEW OF SYSTEMS:  Review of systems is obtained from the patient's wife, [name] since the patient remains sedated from recent EGD procedure as well as Ativan, given for recent MRI.  She reports that the patient has been getting progressively weaker over the past several weeks. He is taking Morphine for low back pain. His wife has had to help him in and out of the bed and assist with ambulate around the house over the past week.  He's had recent urinary frequency and incontinence over the past 2 days and has not had a BM in "weeks".  He also reports pelvic "numbness but cannot clearly describe timing. He denies any chest pain, shortness of breath, cough, fevers, chills, or night sweats.  He has had unintended weight loss over the past 6 months but cannot quantify. He denies nausea or vomiting but has trouble eating solids. He has progressive low back pain into the lower abdomen and right should pain persists but denies any new musculoskeletal or joint aches or pains. A complete review of systems is obtained and is otherwise negative.    PHYSICAL EXAM:  Wt Readings from Last 3 Encounters:  12/28/19 206 lb 2.1 oz (93.5 kg)  12/26/2019 204 lb 14.4 oz (92.9 kg)  09/20/19 224 lb 9.6 oz (  101.9 kg)   Temp Readings from Last 3  Encounters:  12/23/2019 99.6 F (37.6 C) (Oral)  01/07/2020 (!) 97.4 F (36.3 C) (Tympanic)  09/20/19 98.4 F (36.9 C) (Oral)   BP Readings from Last 3 Encounters:  12/30/2019 119/82  01/03/2020 124/78  11/16/19 128/69   Pulse Readings from Last 3 Encounters:  12/12/2019 (!) 103  01/07/2020 (!) 122  11/16/19 80   Pain Assessment Pain Score: 7 /10  In general this is an ill appearing Caucasian male in no acute distress. He is sedated and resting comfortably in bed and really does not participate throughout the examination. HEENT reveals that the patient is normocephalic, atraumatic. EOMs are intact. PERRLA. Skin is intact without any evidence of gross lesions. Cardiopulmonary assessment is negative for acute distress and he exhibits normal effort. The abdomen is soft, non tender, non distended. Lower extremities are negative for pretibial pitting edema, deep calf tenderness, cyanosis or clubbing.   KPS = 40  100 - Normal; no complaints; no evidence of disease. 90   - Able to carry on normal activity; minor signs or symptoms of disease. 80   - Normal activity with effort; some signs or symptoms of disease. 27   - Cares for self; unable to carry on normal activity or to do active work. 60   - Requires occasional assistance, but is able to care for most of his personal needs. 50   - Requires considerable assistance and frequent medical care. 54   - Disabled; requires special care and assistance. 45   - Severely disabled; hospital admission is indicated although death not imminent. 60   - Very sick; hospital admission necessary; active supportive treatment necessary. 10   - Moribund; fatal processes progressing rapidly. 0     - Dead  Karnofsky DA, Abelmann Royersford, Craver LS and Burchenal JH [phone]) The use of the nitrogen mustards in the palliative treatment of carcinoma: with particular reference to bronchogenic carcinoma Cancer 1 [id number]  LABORATORY DATA:  Lab Results  Component Value Date    WBC 12.1 (H) 12/11/2019   HGB 11.5 (L) 12/12/2019   HCT 37.5 (L) 12/15/2019   MCV 88.0 01/06/2020   PLT 213 12/17/2019   Lab Results  Component Value Date   NA 145 12/28/2019   K 5.1 12/28/2019   CL 108 01/07/2020   CO2 27 12/15/2019   Lab Results  Component Value Date   ALT 13 12/26/2019   AST 21 12/23/2019   ALKPHOS 96 12/18/2019   BILITOT 0.3 01/04/2020     RADIOGRAPHY: MR CERVICAL SPINE WO CONTRAST  Result Date: 01/05/2020 CLINICAL DATA:  Initial evaluation for concern for metastatic disease, incontinence, back pain. EXAM: MRI CERVICAL SPINE WITHOUT CONTRAST TECHNIQUE: Multiplanar, multisequence MR imaging of the cervical spine was performed. No intravenous contrast was administered. COMPARISON:  Prior PET-CT from 12/12/2019 FINDINGS: Alignment: Examination moderately degraded by motion artifact. Straightening of the normal cervical lordosis.  No listhesis. Vertebrae: Scattered osseous metastatic disease seen involving the cervical spine, with definite involvement of the C7, T1, T2, and T3 vertebral bodies. Abnormal signal intensity involving the C5 and C6 vertebral bodies could be related to metastatic disease or possibly reactive degenerative change. Metastatic lesions noted involving the occipital condyles bilaterally. Abnormal edema centered about the right C7-T1 facet likely reflects osseous metastatic disease as well (series 11, image 4). Probable extraosseous extension into the adjacent soft tissues at this level (series 12, image 29) no other definite or convincing extraosseous extension seen on  this motion degraded exam. No appreciable epidural involvement. Vertebral body height maintained without pathologic fracture. Cord: Signal intensity within the cervical spinal cord is grossly within normal limits. No convincing cord signal abnormality on this motion degraded exam. Posterior Fossa, vertebral arteries, paraspinal tissues: Retro cerebellar cyst versus mega cisterna magna  noted at the visualized posterior fossa. Craniocervical junction within normal limits. Paraspinous soft tissues otherwise unremarkable. Normal flow voids seen within the vertebral arteries bilaterally. Disc levels: C2-C3: Negative interspace. Moderate right with mild left facet degeneration. No significant canal or foraminal stenosis. C3-C4: Minimal disc bulge. Mild to moderate right with mild left facet hypertrophy. No significant spinal stenosis. Foramina remain patent. C4-C5: Shallow central disc osteophyte complex indents the ventral thecal sac. Mild spinal stenosis without significant cord deformity. Foramina remain patent. C5-C6: Broad-based posterior disc osteophyte complex flattens and indents the ventral thecal sac, slightly eccentric to the left. Resultant mild spinal stenosis with mild flattening of the left hemi cord. No definite cord signal changes. Superimposed right greater than left uncovertebral hypertrophy with no more than mild bilateral C6 foraminal narrowing. C6-C7: Degenerative intervertebral disc space narrowing with diffuse disc osteophyte. Mild flattening of the ventral thecal sac without significant spinal stenosis. Right greater than left uncovertebral spurring with resultant moderate right C7 foraminal stenosis. No significant left foraminal narrowing. C7-T1: Minimal disc bulge. Osseous metastatic disease involves the right C7-T1 facet. Probable involvement of the adjacent right T1 transverse process and posterior right first rib (series 12, images 29, 30). No significant epidural extension at this time. Spinal canal remains patent. Neural foramina remain grossly patent as well. IMPRESSION: 1. Scattered osseous metastatic disease involving the cervical spine as above. No significant epidural extension at this time. No pathologic fracture or other complication. 2. Metastatic deposit centered about the right C7-T1 facet with associated extraosseous extension into the surrounding soft  tissues as above. No other significant extra osseous disease identified on this motion degraded exam. 3. Moderate multilevel cervical spondylosis as above, with resultant mild spinal stenosis at C4-5 through C6-7. Moderate right C7 foraminal narrowing related to disc osteophyte and uncovertebral disease. Electronically Signed   By: Jeannine Boga M.D.   On: 12/30/2019 21:59   MR THORACIC SPINE WO CONTRAST  Result Date: 12/23/2019 CLINICAL DATA:  Metastatic esophageal cancer EXAM: MRI THORACIC SPINE WITHOUT CONTRAST TECHNIQUE: Multiplanar, multisequence MR imaging of the thoracic spine was performed. No intravenous contrast was administered. COMPARISON:  None. FINDINGS: Alignment:  Anteroposterior alignment is maintained. Vertebrae: STIR hyperintense lesions throughout the thoracic spine reflecting metastatic disease with involvement of posterior elements. No acute compression deformity. No significant epidural extension. Cord:  Normal caliber and signal. Paraspinal and other soft tissues: Unremarkable. Disc levels: Mild multilevel degenerative disc disease and facet arthropathy. There is no high-grade degenerative stenosis. IMPRESSION: Diffuse osseous metastatic disease. No acute compression deformity or significant epidural extension. Electronically Signed   By: Macy Mis M.D.   On: 12/21/2019 14:37   MR LUMBAR SPINE WO CONTRAST  Result Date: 12/18/2019 CLINICAL DATA:  Initial evaluation for back pain, incontinence, metastatic disease evaluation. EXAM: MRI LUMBAR SPINE WITHOUT CONTRAST TECHNIQUE: Multiplanar, multisequence MR imaging of the lumbar spine was performed. No intravenous contrast was administered. COMPARISON:  Prior PET-CT from 12/12/2019 FINDINGS: Segmentation: Standard. Lowest well-formed disc space labeled the L5-S1 level. Alignment: 2 mm retrolisthesis of L2 on L3. Alignment otherwise normal with preservation of the normal lumbar lordosis. Vertebrae: Extensive diffuse osseous  metastases seen throughout the visualized thoracolumbar spine as well as the sacrum  and pelvis. There is involvement of the centrally all levels. For reference purposes, largest of these lesions involves the left anterior aspect of the L2 vertebral body and measures approximately 3.2 cm in greatest dimension (series 11, image 11). Vertebral body height maintained without pathologic fracture. There is early extraosseous extension at the level of the sacrum/S1 segment, with tumor encroaching upon the descending S1 nerve roots bilaterally, right greater than left (series 13, image 34). Slight mass effect on the descending right S1 nerve root which is somewhat flattened and displaced posteriorly. No overt neural impingement. No other significant extra osseous extension of tumor seen elsewhere within the lumbar spine at this time. No other significant epidural extension. Conus medullaris and cauda equina: Conus extends to the T12-L1 level. Conus and cauda equina appear normal. No evidence for compression of the distal cord or nerve roots of the cauda equina. Paraspinal and other soft tissues: Left periaortic/retroperitoneal lymph nodes measuring up to 9 mm noted (series 12, image 5), nonspecific, but could reflect nodal metastases. Atherosclerotic change noted within the visualized aorta. Multiple scattered cystic lesions noted about the kidneys, several of which demonstrate intrinsic T1 hyperintensity within the right kidney (series 13, image 10). Findings are indeterminate, and could reflect proteinaceous and/or hemorrhagic cyst. These are not well assessed on this noncontrast examination. Paraspinous soft tissues demonstrate no other acute finding. Disc levels: T12-L1: Mild diffuse disc bulge with disc desiccation. Chronic endplate Schmorl's node deformities. No significant spinal stenosis. Foramina remain patent. L1-2: Mild disc bulge with disc desiccation. Chronic endplate Schmorl's node deformity. Mild facet  hypertrophy. No spinal stenosis. Foramina remain patent. L2-3: Trace retrolisthesis. Degenerative intervertebral disc space narrowing with mild diffuse disc bulge and disc desiccation. Mild left greater than right facet hypertrophy. Resultant mild narrowing of the lateral recesses bilaterally, left greater than right. Central canal remains patent. No significant foraminal encroachment. L3-4: Disc desiccation with mild disc bulge. Central annular fissure. Mild bilateral facet hypertrophy. Resultant mild lateral recess narrowing bilaterally. Central canal remains patent. No significant foraminal encroachment. L4-5: Disc desiccation with mild diffuse disc bulge. Central annular fissure. Mild facet and ligament flavum hypertrophy. Resultant mild narrowing of the right lateral recess. Foramina remain patent. L5-S1: Disc desiccation without significant disc bulge. Severe right with moderate left facet degeneration. Mild epidural lipomatosis. No significant canal or foraminal stenosis. IMPRESSION: 1. Extensive diffuse osseous metastases throughout the visualized thoracolumbar spine and pelvis. No pathologic fracture. 2. Early extraosseous extension of tumor at the level of the sacrum/S1 segment, with tumor encroaching upon the descending S1 nerve roots bilaterally, right greater than left. Slight mass effect on the descending right S1 nerve root without overt neural impingement. 3. No other significant extra osseous extension or epidural tumor elsewhere within the lumbar spine. No cord or cauda equina compression. 4. Left periaortic/retroperitoneal lymph nodes measuring up to 9 mm, nonspecific, but could reflect nodal metastases. 5. Mild multilevel degenerative spondylosis with resultant mild lateral recess narrowing bilaterally at L2-3 and L3-4, and on the right at L4-5. Electronically Signed   By: Jeannine Boga M.D.   On: 12/15/2019 21:46   NM PET Image Initial (PI) Skull Base To Thigh  Result Date:  12/12/2019 CLINICAL DATA:  Initial treatment strategy for neoplasm of esophagus. Bone metastasis. COVID vaccine in left arm 5/21 EXAM: NUCLEAR MEDICINE PET SKULL BASE TO THIGH TECHNIQUE: 11.1 mCi F-18 FDG was injected intravenously. Full-ring PET imaging was performed from the skull base to thigh after the radiotracer. CT data was obtained and used for  attenuation correction and anatomic localization. Fasting blood glucose: 89 mg/dl COMPARISON:  11/20/2019 chest abdomen and pelvic CTs. FINDINGS: Mediastinal blood pool activity: SUV max 3.3 Liver activity: SUV max NA NECK: Laryngeal hypermetabolism is favored to be physiologic, without well-defined CT correlate. No cervical nodal hypermetabolism. Incidental CT findings: No cervical adenopathy. CHEST: Small hypermetabolic mediastinal nodes, including a prevascular node of 7 mm and a S.U.V. max of 12.4 on 63/4. Distal esophageal hypermetabolism with mild wall thickening, including at a S.U.V. max of 18.6 on 97/4. Incidental CT findings: Aortic and coronary artery atherosclerosis. Pulmonary artery enlargement, outflow tract 3.3 cm. Centrilobular and paraseptal emphysema. Central right upper lobe 1.3 cm pulmonary nodule is again favored to represent a hamartoma or granuloma, given central calcification. Not hypermetabolic. ABDOMEN/PELVIS: Bilateral hepatic metastasis which are relatively CT occult. Example in the anterior left hepatic lobe at a S.U.V. max of 13.3 and within the central high right hepatic lobe at a S.U.V. max of 12.9. Abdominal nodal metastasis, with index portacaval node measuring 1.1 cm and a S.U.V. max of 15.9 on 112/4. Multifocal right-sided colonic hypermetabolism is favored to be physiologic. Left adrenal hypermetabolic nodule measures 1.1 cm and a S.U.V. max of 15.0 on 108/4 Incidental CT findings: Extensive colonic diverticulosis. Cholecystectomy. Abdominal aortic atherosclerosis. Nonaneurysmal dilatation at 2.8 cm. SKELETON: Widespread osseous  metastasis. The previously described left scapular/coracoid lesion measures 4.5 cm and a S.U.V. max of 21.0 on 37/4. A sacral lytic lesion measures a S.U.V. max of 16.7. Incidental CT findings: none IMPRESSION: 1. Widespread metastatic disease, including to bones, liver, thoracoabdominal nodes, left adrenal gland. The primary is presumably an area of distal esophageal wall thickening and hypermetabolism, given the clinical history. 2. Incidental findings, including: Aortic atherosclerosis (ICD10-I70.0), coronary artery atherosclerosis and emphysema (ICD10-J43.9). Pulmonary artery enlargement suggests pulmonary arterial hypertension. Electronically Signed   By: Abigail Miyamoto M.D.   On: 12/12/2019 09:20   DG CHEST PORT 1 VIEW  Result Date: 12/17/2019 CLINICAL DATA:  Metastatic cancer EXAM: PORTABLE CHEST 1 VIEW COMPARISON:  03/26/2014 FINDINGS: Calcified granuloma in the right mid lung. Heart is normal size. No confluent opacities or effusions. No acute bony abnormality. IMPRESSION: No active disease. Electronically Signed   By: Rolm Baptise M.D.   On: 01/03/2020 20:25      IMPRESSION/PLAN: 42. 69 y.o. 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 Today, I talked to the patient and his wife, [name], about the findings and workup thus far. We discussed the natural history of metastatic carcinoma and general treatment, highlighting the role of palliative radiotherapy in the management of painful bony metastases. We discussed the available radiation techniques, and focused on the details and logistics of delivery. The recommendation is for a 2 week course of daily external beam radiation to the painful sites of disease in the lumbar spine and sacrum.  We reviewed the anticipated acute and late sequelae associated with radiation in this setting. The patient and his wife were encouraged to ask questions that were answered to their stated  satisfaction.  At the conclusion of our discussion, the patient is in agreement to proceed with the recommended 2 week course of palliative radiotherapy. Given the patient's AMS/sedation, the patient's wife, [name], freely signs written consent to proceed with treatment planning today and a copy of this document will be placed in his medical record. He will have CT SIM today in anticipation of beginning his daily radiation treatments on Sunday, 12/31/19. We discussed the ability  to continue the radiotherapy as an outpatient should his pain improve and his medical team deem him safe/stable for discharge home prior to completion of his radiation. We enjoyed meeting him and his wife today and look forward to continuing to participate in his care.  In a visit lasting 70 minutes, greater than 50% of that time was spent in chart review, preparing for our discussion and floor time, discussing his case and coordinating his care.   Nicholos Johns, PA-C    Tyler Pita, MD  [facility] Direct Dial: [phone]   Fax: [phone] [email or web address]   Skype   [email or web address]

## 2019-12-29 NOTE — Anesthesia Postprocedure Evaluation (Signed)
Anesthesia Post Note  Patient: Andrew Warren  Procedure(s) Performed: ESOPHAGOGASTRODUODENOSCOPY (EGD) WITH PROPOFOL (N/A ) BIOPSY     Patient location during evaluation: Endoscopy Anesthesia Type: MAC Level of consciousness: awake Pain management: pain level controlled Vital Signs Assessment: post-procedure vital signs reviewed and stable Respiratory status: spontaneous breathing Cardiovascular status: stable Postop Assessment: no apparent nausea or vomiting Anesthetic complications: no   No complications documented.  Last Vitals:  Vitals:   12/24/2019 0950 01/07/2020 1000  BP: 117/76 108/70  Pulse: 95 96  Resp: 20 (!) 22  Temp:    SpO2: 97% 99%    Last Pain:  Vitals:   01/05/2020 0942  TempSrc: Axillary  PainSc:                  [name]

## 2019-12-29 NOTE — Progress Notes (Signed)
Multiple attempts to get MRI's, brought patient down for MRI Brain and T spine, pt states he cannot lay his head flat enough to fit into head coil and we attempted to use flex coil, unable to obtain Patient in too much pain to continue   Best obtainable images

## 2019-12-29 NOTE — Consult Note (Signed)
Consultation Note Date: 12/28/2019   Patient Name: Andrew Warren  DOB: Dec 08, 1950  MRN: [id number]  Age / Sex: 69 y.o., male  PCP: [name], MD Referring Physician: Elodia Florence., *  Reason for Consultation: Establishing goals of care, Non pain symptom management and Pain control  HPI/Patient Profile: 69 y.o. male  with past medical history of rheumatoid arthritis, CKD, DM, and bipoloar disorder.  He was diagnosed with cancer in early October but was in the process of an outpatient work up and was unaware of the extent of his cancer.  He was admitted on 01/09/2020 with encephalopathy and urinary incontinence. Imaging shows metastasis thru his entire spine encroaching on the S1 nerve root, as well as mets in the liver and thoracoabdominal nodes.  EGD done 11/19 showed a large distal esophageal ulcerating mass.  Biopsies were taken.  Clinical Assessment and Goals of Care:  I have reviewed medical records including EPIC notes, labs and imaging, received report from the bedside RN, examined the patient and spoke with his wife, [name] on the phone to discuss diagnosis prognosis, [place], EOL wishes, disposition and options.  Lynx is unable to talk with me.  His eyes appear glazed and he is able to grunt one of my questions.   I introduced Palliative Medicine as specialized medical care for people living with serious illness. It focuses on providing relief from the symptoms and stress of a serious illness.   We discussed a brief life review of the patient.   Tavaris and [name] have been married for 30 years. Each of them has children from a prior marriage.  They work together driving a Teacher, music.  [name] mentions they work for the [employer].  She describes Barnabas Lister as a strong, independent man.  He does not attend church but is a [place].   She talked with me about his bipolar  disease.  In the 30 years they have been together he has always taken his medications and his bipolar has remained well controlled other than 1 break 6 years ago when he was taken off of Lithium due to kidney disease.  Kim describes Rumi's rapid decline over the last 7 weeks.  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  They visited the doctor and imaging showed cancer in the shoulder.  Since then they have had a piecemeal work up and felt very frustrated.  Over the past week Hasten has become confused.  He can't get up and down but himself.  He urinates every 20 minutes.  He has severe pain.  He doesn't sleep.  [name] explains that she has been thru cancer with her father, sister, and herself.   We discussed that the cancer was advanced and seems to be causing a rapid decline.  [name] wants to support Diyari and she does not want him to have prolonged suffering.  She would rather have him go quickly than suffer.  We discussed code status.  [name] felt certain that as  Kylen has a terminal disease he would not want to be aggressively resuscitated in the event of a code and he would not want life support.  We talked further about his symptoms.  She tells me he has not had a bowel movement in 1 month.  He has severe neck pain, and has been incontinent of urine for 3 days.  [name] expressed appreciation for Dr. Ernestina Penna care and the information she has provided.  We will take it day by day, wait for the biopsy results and see what options are available.  Questions and concerns were addressed.  The family was encouraged to call with questions or concerns.   Primary Decision Maker:  NEXT OF KIN  WIfe, [name].    SUMMARY OF RECOMMENDATIONS    Code status changed to DNR  [name] is supportive of Robin's desire to "Take chemo and go into remission" but she doesn't feel this is realistic and she does not want him to have prolonged suffering.  Palliative radiation to  the spine has been ordered.  Biopsy results will likely return Monday.  PMT will follow with you.  Code Status/Advance Care Planning:  DNR   Symptom Management:   Agree with MSIR PRN  Senna two tabs BID  ICE / heat for pain  Scheduled tylenol for 12 doses.   Additional Recommendations (Limitations, Scope, Preferences):  Full Scope Treatment  Palliative Prophylaxis:   Delirium Protocol  Psycho-social/Spiritual:   Desire for further Chaplaincy support: welcome   Prognosis:   Very concerned given rapidly progressive metastatic cancer and patient's rapid decline since 9/24.  Discharge Planning: To Be Determined      Primary Diagnoses: Present on Admission:  Metastatic cancer ([place])   I have reviewed the medical record, interviewed the patient and family, and examined the patient. The following aspects are pertinent.  Past Medical History:  Diagnosis Date   Adenomatous polyps 2004   per H&P in echart by Dr. Arnoldo Morale   Bipolar disorder [facility])    BPH (benign prostatic hyperplasia)    Chronic kidney disease    Diabetes mellitus    Genital warts    High cholesterol    Rheumatoid arthritis(714.0)    S/P colonoscopy Jan 2008   diverticulosis in sigmoid, Dr. Arnoldo Morale   Social History   Socioeconomic History   Marital status: Married    Spouse name: Not on file   Number of children: 2   Years of education: Not on file   Highest education level: Not on file  Occupational History   Occupation: truker driver   Tobacco Use   Smoking status: Former Smoker    Packs/day: 3.00    Years: 30.00    Pack years: 90.00    Types: Cigarettes    Quit date: 08/09/2005    Years since quitting: 14.3   Smokeless tobacco: Never Used  Substance and Sexual Activity   Alcohol use: No   Drug use: No   Sexual activity: Yes  Other Topics Concern   Not on file  Social History Narrative   Not on file   Social Determinants of Health   Financial  Resource Strain:    Difficulty of Paying Living Expenses: Not on file  Food Insecurity:    Worried About Charity fundraiser in the Last Year: Not on file   [employer] of Food in the Last Year: Not on file  Transportation Needs:    Lack of Transportation (Medical): Not on file   Lack of Transportation (Non-Medical):  Not on file  Physical Activity:    Days of Exercise per Week: Not on file   Minutes of Exercise per Session: Not on file  Stress:    Feeling of Stress : Not on file  Social Connections:    Frequency of Communication with Friends and Family: Not on file   Frequency of Social Gatherings with Friends and Family: Not on file   Attends Religious Services: Not on file   Active Member of Clubs or Organizations: Not on file   Attends Archivist Meetings: Not on file   Marital Status: Not on file   Family History  Problem Relation Age of Onset   Dementia Mother    Schizophrenia Father    Diabetes Father    Heart disease Maternal Grandfather    Diabetes Maternal Grandfather    Cancer Maternal Grandfather        brain tumor    Heart disease Maternal Grandmother    Diabetes Maternal Grandmother    Heart disease Paternal Grandfather    Diabetes Paternal Grandfather    Heart disease Paternal Grandmother    Diabetes Paternal Grandmother    Seizures Sister    Hypertension Sister    Heart attack Brother    Colon cancer Neg Hx     Allergies  Allergen Reactions   Penicillins Shortness Of Breath    Has patient had a PCN reaction causing immediate rash, facial/tongue/throat swelling, SOB or lightheadedness with hypotension: Yes Has patient had a PCN reaction causing severe rash involving mucus membranes or skin necrosis: No Has patient had a PCN reaction that required hospitalization No Has patient had a PCN reaction occurring within the last 10 years: Yes If all of the above answers are "NO", then may proceed with Cephalosporin  use.  Shortness of breath/ tightning in throat   Lithium     Caused renal failure    Vital Signs: BP 119/82 (BP Location: Right Arm)    Pulse (!) 103    Temp 99.6 F (37.6 C) (Oral)    Resp 16    Ht 5\' 8"  (1.727 m)    Wt 93.5 kg    SpO2 99%    BMI 31.34 kg/m  Pain Scale: 0-10   Pain Score: Asleep   SpO2: SpO2: 99 % O2 Device:SpO2: 99 % O2 Flow Rate: .O2 Flow Rate (L/min): 2 L/min    Palliative Assessment/Data: 20%     Time In: 5:00 Time Out: 6:10 Time Total: 70 min. Visit consisted of counseling and education dealing with the complex and emotionally intense issues surrounding the need for palliative care and symptom management in the setting of serious and potentially life-threatening illness. Greater than 50%  of this time was spent counseling and coordinating care related to the above assessment and plan.  Signed by: Florentina Jenny, PA-C Palliative Medicine  Please contact Palliative Medicine Team phone at [phone] for questions and concerns.  For individual provider: See Shea Evans

## 2019-12-30 DIAGNOSIS — C787 Secondary malignant neoplasm of liver and intrahepatic bile duct: Secondary | ICD-10-CM | POA: Diagnosis not present

## 2019-12-30 LAB — GLUCOSE, CAPILLARY
Glucose-Capillary: 136 mg/dL — ABNORMAL HIGH (ref 70–99)
Glucose-Capillary: 246 mg/dL — ABNORMAL HIGH (ref 70–99)
Glucose-Capillary: 182 mg/dL — ABNORMAL HIGH (ref 70–99)
Glucose-Capillary: 145 mg/dL — ABNORMAL HIGH (ref 70–99)

## 2019-12-30 LAB — CBC WITH DIFFERENTIAL/PLATELET
Abs Immature Granulocytes: 0.26 10*3/uL — ABNORMAL HIGH (ref 0.00–0.07)
Basophils Absolute: 0.1 10*3/uL (ref 0.0–0.1)
Basophils Relative: 1 %
Eosinophils Absolute: 0.2 10*3/uL (ref 0.0–0.5)
Eosinophils Relative: 2 %
HCT: 38.9 % — ABNORMAL LOW (ref 39.0–52.0)
Hemoglobin: 11.1 g/dL — ABNORMAL LOW (ref 13.0–17.0)
Immature Granulocytes: 3 %
Lymphocytes Relative: 13 %
Lymphs Abs: 1.3 10*3/uL (ref 0.7–4.0)
MCH: 26.8 pg (ref 26.0–34.0)
MCHC: 28.5 g/dL — ABNORMAL LOW (ref 30.0–36.0)
MCV: 94 fL (ref 80.0–100.0)
Monocytes Absolute: 1.3 10*3/uL — ABNORMAL HIGH (ref 0.1–1.0)
Monocytes Relative: 12 %
Neutro Abs: 7.1 10*3/uL (ref 1.7–7.7)
Neutrophils Relative %: 69 %
Platelets: 204 10*3/uL (ref 150–400)
RBC: 4.14 MIL/uL — ABNORMAL LOW (ref 4.22–5.81)
RDW: 14.6 % (ref 11.5–15.5)
WBC: 10.2 10*3/uL (ref 4.0–10.5)
nRBC: 0 % (ref 0.0–0.2)

## 2019-12-30 LAB — COMPREHENSIVE METABOLIC PANEL
ALT: 11 U/L (ref 0–44)
AST: 23 U/L (ref 15–41)
Albumin: 2.6 g/dL — ABNORMAL LOW (ref 3.5–5.0)
Alkaline Phosphatase: 88 U/L (ref 38–126)
Anion gap: 11 (ref 5–15)
BUN: 30 mg/dL — ABNORMAL HIGH (ref 8–23)
CO2: 25 mmol/L (ref 22–32)
Calcium: 10 mg/dL (ref 8.9–10.3)
Chloride: 110 mmol/L (ref 98–111)
Creatinine, Ser: 1.82 mg/dL — ABNORMAL HIGH (ref 0.61–1.24)
GFR, Estimated: 40 mL/min — ABNORMAL LOW (ref >60)
Glucose, Bld: 150 mg/dL — ABNORMAL HIGH (ref 70–99)
Potassium: 5.5 mmol/L — ABNORMAL HIGH (ref 3.5–5.1)
Sodium: 146 mmol/L — ABNORMAL HIGH (ref 135–145)
Total Bilirubin: 0.6 mg/dL (ref 0.3–1.2)
Total Protein: 5.7 g/dL — ABNORMAL LOW (ref 6.5–8.1)

## 2019-12-30 LAB — PHOSPHORUS: Phosphorus: 4.6 mg/dL (ref 2.5–4.6)

## 2019-12-30 LAB — MAGNESIUM: Magnesium: 2.4 mg/dL (ref 1.7–2.4)

## 2019-12-30 MED ORDER — ENOXAPARIN SODIUM 40 MG/0.4ML ~~LOC~~ SOLN
40.0000 mg | SUBCUTANEOUS | Status: DC
  Administered 2019-12-30 – 2020-01-06 (×8): 40 mg via SUBCUTANEOUS
  Filled 2019-12-30 (×8): qty 0.4

## 2019-12-30 MED ORDER — SODIUM ZIRCONIUM CYCLOSILICATE 10 G PO PACK
10.0000 g | PACK | Freq: Once | ORAL | Status: AC
  Administered 2019-12-30: 10 g via ORAL
  Filled 2019-12-30: qty 1

## 2019-12-30 MED ORDER — ACETAMINOPHEN 160 MG/5ML PO SOLN: 1000.0000 mg | Freq: Four times a day (QID) | ORAL | Status: DC | PRN

## 2019-12-30 MED ORDER — ACETAMINOPHEN 160 MG/5ML PO SOLN: 650.0000 mg | Freq: Four times a day (QID) | ORAL | Status: DC | PRN

## 2019-12-30 MED ORDER — ACETAMINOPHEN 160 MG/5ML PO SOLN
1000.0000 mg | Freq: Three times a day (TID) | ORAL | Status: DC | PRN
  Administered 2019-12-31: 1000 mg via ORAL
  Filled 2019-12-30: qty 40.6

## 2019-12-30 MED ORDER — DEXTROSE-NACL 5-0.45 % IV SOLN: INTRAVENOUS | Status: AC

## 2019-12-30 NOTE — Evaluation (Signed)
Clinical/Bedside Swallow Evaluation Patient Details  Name: Andrew Warren MRN: [id number] Date of Birth: 09/02/1950  Today's Date: 12/30/2019 Time: SLP Start Time (ACUTE ONLY): 46 SLP Stop Time (ACUTE ONLY): 1700 SLP Time Calculation (min) (ACUTE ONLY): 25 min  Past Medical History:  Past Medical History:  Diagnosis Date  . Adenomatous polyps 2004   per H&P in echart by Dr. Arnoldo Morale  . Bipolar disorder ([place])   . BPH (benign prostatic hyperplasia)   . Chronic kidney disease   . Diabetes mellitus   . Genital warts   . High cholesterol   . Rheumatoid arthritis(714.0)   . S/P colonoscopy Jan 2008   diverticulosis in sigmoid, Dr. Arnoldo Morale   Past Surgical History:  Past Surgical History:  Procedure Laterality Date  . APPENDECTOMY    . BIOPSY  03/02/2016   Procedure: BIOPSY;  Surgeon: Daneil Dolin, MD;  [facility];  Service: Endoscopy;;  biopsy evaluate for microscopic colitis  . COLONOSCOPY WITH PROPOFOL N/A 03/02/2016   Procedure: COLONOSCOPY WITH PROPOFOL;  Surgeon: Daneil Dolin, MD;  Location: [facility];  Service: Endoscopy;  Laterality: N/A;  10:00am  . GALLBLADDER SURGERY  11/2014  . POLYPECTOMY  03/02/2016   Procedure: POLYPECTOMY;  Surgeon: Daneil Dolin, MD;  Location: [facility];  Service: Endoscopy;;  cecal polyp  . varicocele repair     HPI:  69yo male admitted 12/19/2019 with incontinence, saddle anesthesia, encephalopathy. PMH: bipolar disorder, HTN, HLD, metastatic cancer, BPH, DM, RA. Esophageal cancer, stage 4, multiple hepatic hypoechoic lesions 12/28/2019 CXR revealed no active disease; BSE generated.  Assessment / Plan / Recommendation Clinical Impression  Pt seen for a BSE with current diet being clear liquids.  Pt stated he had difficulty swallowing, but wife denies dysphagia.  Pt not good historian initiallly d/t pain requiring narcotics when first admitted.  Pt consumed thin via straw, puree and solids with adequate mastication noted,  good oral/pharyngeal control and timely swallow. No overt s/s of aspiration noted throughout assessment.  Pt exhibited min belching during BSE, but he does have a pmhx of GERD which could contribute to air retention.  Recommend as pt is able to medically tolerate, to progress current diet from clear liquids to regular/thin liquids to increase satiety.  ST will f/u for diet tolerance while in acute setting.  Thank you for this consult. SLP Visit Diagnosis: Dysphagia, unspecified (R13.10)    Aspiration Risk  Mild aspiration risk    Diet Recommendation   Regular/thin liquids (as tolerated)  Medication Administration: Other (Comment) (Whole meds with puree or thin (prn))    Other  Recommendations Oral Care Recommendations: Oral care BID   Follow up Recommendations Other (comment) (TBD)      Frequency and Duration min 1 x/week  1 week       Prognosis Prognosis for Safe Diet Advancement: Good      Swallow Study   General Date of Onset: 01/04/2020 HPI: 69yo male admitted 12/22/2019 with incontinence, saddle anesthesia, encephalopathy. PMH: bipolar disorder, HTN, HLD, metastatic cancer, BPH, DM, RA. Esophageal cancer, stage 4, multiple hepatic hypoechoic lesions Type of Study: Bedside Swallow Evaluation Previous Swallow Assessment: n/a Diet Prior to this Study: Thin liquids Temperature Spikes Noted: Yes Respiratory Status: Nasal cannula History of Recent Intubation: No Behavior/Cognition: Alert;Cooperative;Requires cueing Oral Cavity Assessment: Within Functional Limits Oral Care Completed by SLP: No Oral Cavity - Dentition: Dentures, top;Dentures, bottom Vision: Functional for self-feeding Self-Feeding Abilities: Needs assist Patient Positioning: Upright in bed Baseline Vocal  Quality: Normal Volitional Cough: Strong Volitional Swallow: Able to elicit    Oral/Motor/Sensory Function Overall Oral Motor/Sensory Function: Within functional limits   Ice Chips Ice chips: Not tested   Thin  Liquid Thin Liquid: Within functional limits Presentation: Straw    Nectar Thick Nectar Thick Liquid: Not tested   Honey Thick Honey Thick Liquid: Not tested   Puree Puree: Within functional limits Presentation: Spoon   Solid     Solid: Within functional limits Presentation: Self Fed      Elvina Sidle, M.S., CCC-SLP 12/30/2019,5:41 PM

## 2019-12-30 NOTE — Progress Notes (Signed)
PHARMACY NOTE -  Lovenox  Pharmacy has been assisting with dosing of Lovenox for VTE ppx.  Dosage remains stable at 40 mg SQ q24 hr based on current renal function  Pharmacy will sign off, following peripherally for dose adjustments. Please reconsult if a change in clinical status warrants re-evaluation of dosage.  Reuel Boom, PharmD, BCPS [phone] 12/30/2019, 3:04 PM

## 2019-12-30 NOTE — Plan of Care (Signed)
  Problem: Clinical Measurements: Goal: Will remain free from infection Outcome: Progressing   Problem: Pain Managment: Goal: General experience of comfort will improve Outcome: Progressing   Problem: Safety: Goal: Ability to remain free from injury will improve Outcome: Progressing   

## 2019-12-30 NOTE — Progress Notes (Signed)
PROGRESS NOTE    Andrew Warren  WUJ:[id number] DOB: May 23, 1950 DOA: 12/31/2019 PCP: Andrew Percy, MD   No chief complaint on file.  Brief Narrative:  Andrew Warren is Andrew Warren 69 y.o. male with medical history significant of bipolar disorder, HTN, HLD, and metastatic cancer who presents to the hospital after Andrew Warren clinic visit.    History is obtained from patient and the medical record.  Limited from patient due to encephalopathy.  Wife not at bedside and unable to be reached by phone.  Per oncology note, he was seen by orthopedics outpatient who diagnosed cancer via MRI and he was referred to oncology in early October 10/21.  He has not had any definitive diagnosis with biopsy yet.  He's taking morphine for pain in his back, chest and upper abdomen.  He's noticed recent urinary frequency and incontinence over the past 2 days.  He hasn't had Andrew Warren BM in weeks.  He's slower to speak and respond since heing on morphine.  His wife helps him ambulate, use the bathroom over the past week.  Seems like he's been weaker over the apst several weeks.  He notes he's constipated and has issues urinating.  C/o numbness to groin.  He's unable to clearly describe timing.  He complains of pain, but isn't able to describe this well.  No smoking or etoh per his report.   Assessment & Plan:   Active Problems:   Metastatic cancer ([place])   Esophageal mass   Abnormal gastrointestinal positron emission tomography (PET) scan   Palliative care encounter  Saddle Anesthesia   Incontinence   Constipation   Concern for Cord Compression in setting of metastatic disease:  Follow MRI C/T/L spine without contrast given CKD MRI c spine with scattered osseous metastatic dz involving the cervical spine (see report) MRI with extensive diffuse osseous mets throughout visualized thoracolumbar spine and pelvis.  Early extraosseous extension of tumor at level of the sacrum /S1 segment with tumor encroaching upon the descending S1  nerve roots bilaterally, R > L.  Slight mass effect on the descending R S1 nerve root without overt neural impingement.  No cord or cauda equina compression.  (see report)  thoracic MRI with diffuse osseous metastatic disease MRI brain canceled due to inability to tolerate Per Dr. 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  The primary is presumably an area of distal esophageal wall thickening and hypermetabolism given the clinical hx.  See report. 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  Bipolar: on depakote, seroquel at home, will continue for now, likely needs adjustment with encephalopathy  HLD: resume lipitor   HTN: hold lisinopril for now  BPH: resume flomax  Follow EKG for QT - pending  DVT prophylaxis: lovenox Code Status: DNR Family Communication: sister at bedside, wife over phone Disposition:   Status is: Inpatient  Remains inpatient appropriate because:Inpatient level of care appropriate due to severity of illness   Dispo: The patient is from: Home              Anticipated d/c is to: pending              Anticipated d/c date is: > 3 days              Patient currently is not medically stable to d/c.  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 Biopsied. - Gastritis. Biopsied. - Erythematous duodenopathy. Biopsied. - Normal second portion of the duodenum. Recommendations Soft diet. - Continue present medications. - Await pathology results. - Follow up with Oncology - Use Protonix (pantoprazole) 40 mg PO BID. - Use sucralfate suspension 1 gram PO QID. - Called his wife and discussed endoscopic findings - GI is available if needed, please call with any questions  Antimicrobials:  Anti-infectives (From admission, onward)   None        Subjective: No new complaints Just got his pain meds  Objective: Vitals:   12/12/2019 2204 12/30/19 0558 12/30/19 1502 12/30/19 1937  BP: 117/77 113/71 126/73 124/73  Pulse: (!) 110 (!) 107 (!) 105 (!) 102  Resp: 18 20 18 16   Temp: 100 F (37.8 C) 98.4 F (36.9 C) 99.2 F (37.3 C) 98.4 F (36.9 C)  TempSrc: Oral Oral Oral Oral  SpO2: 98% 97% 98% 91%  Weight:      Height:        Intake/Output Summary (Last 24 hours) at 12/30/2019 2016 Last data filed at 12/30/2019 1812 Gross per 24 hour  Intake 1776.42 ml  Output 2751 ml  Net -974.58 ml   Filed Weights   12/20/2019 1700 12/28/19  0508  Weight: 92.4 kg 93.5 kg    Examination:  General: No acute distress. Cardiovascular: Heart sounds show Andrew Warren regular rate, and rhythm. Lungs: Clear to auscultation bilaterally Abdomen: Soft, nontender, nondistended Neurological: Alert, improved compared to yesterday, less lethargy. Moves all extremities 4 . Cranial nerves II through XII grossly intact. Skin: Warm and dry. No rashes or lesions. Extremities: No clubbing or cyanosis. No edema.    Data Reviewed: I have personally reviewed following labs and imaging studies  CBC: Recent Labs  Lab 12/17/2019 1748 12/28/19 0555 01/07/2020 0714 12/30/19 0643  WBC 13.1* 12.0* 12.1* 10.2  NEUTROABS 9.6*  --  8.3* 7.1  HGB 11.9* 11.7* 11.5* 11.1*  HCT 38.7* 38.5* 37.5* 38.9*  MCV 88.4 89.7 88.0 94.0  PLT 256 224 213 638    Basic Metabolic Panel: Recent Labs  Lab 12/30/2019 1748 12/28/19 0555 01/03/2020 0714 12/30/19 0643  NA 137 140 145 146*  K 4.8 5.1 5.1 5.5*  CL 101  103 108 110  CO2 25 26 27 25   GLUCOSE 92 96 123* 150*  BUN 51* 45* 34* 30*  CREATININE 1.89* 1.77* 1.65* 1.82*  CALCIUM 10.9* 10.5* 10.6* 10.0  MG 2.5*  --  2.3 2.4  PHOS 4.4  --  3.7 4.6    GFR: Estimated Creatinine Clearance: 43.1 mL/min (Shondra Capps) (by C-G formula based on SCr of 1.82 mg/dL (H)).  Liver Function Tests: Recent Labs  Lab 12/13/2019 1748 12/28/19 0555 01/09/2020 0714 12/30/19 0643  AST 19 22 21 23   ALT 12 12 13 11   ALKPHOS 100 88 96 88  BILITOT 0.5 0.4 0.3 0.6  PROT 6.9 6.3* 6.1* 5.7*  ALBUMIN 3.3* 2.9* 2.8* 2.6*    CBG: Recent Labs  Lab 12/14/2019 1714 01/02/2020 2157 12/30/19 0728 12/30/19 1155 12/30/19 1700  GLUCAP 75 93 136* 246* 182*     Recent Results (from the past 240 hour(s))  Respiratory Panel by RT PCR (Flu Omero Kowal&B, Covid) - Nasopharyngeal Swab     Status: None   Collection Time: 01/07/2020  5:20 PM   Specimen: Nasopharyngeal Swab  Result Value Ref Range Status   SARS Coronavirus 2 by RT PCR NEGATIVE NEGATIVE Final     Comment: (NOTE) SARS-CoV-2 target nucleic acids are NOT DETECTED.  The SARS-CoV-2 RNA is generally detectable in upper respiratoy specimens during the acute phase of infection. The lowest concentration of SARS-CoV-2 viral copies this assay can detect is 131 copies/mL. Takira Sherrin negative result does not preclude SARS-Cov-2 infection and should not be used as the sole basis for treatment or other patient management decisions. Shrita Thien negative result may occur with  improper specimen collection/handling, submission of specimen other than nasopharyngeal swab, presence of viral mutation(s) within the areas targeted by this assay, and inadequate number of viral copies (<131 copies/mL). Ladoris Lythgoe negative result must be combined with clinical observations, patient history, and epidemiological information. The expected result is Negative.  Fact Sheet for Patients:  PinkCheek.be  Fact Sheet for Healthcare Providers:  GravelBags.it  This test is no t yet approved or cleared by the [place] FDA and  has been authorized for detection and/or diagnosis of SARS-CoV-2 by FDA under an Emergency Use Authorization (EUA). This EUA will remain  in effect (meaning this test can be used) for the duration of the COVID-19 declaration under Section 564(b)(1) of the Act, 21 U.S.C. section 360bbb-3(b)(1), unless the authorization is terminated or revoked sooner.     Influenza Keighan Amezcua by PCR NEGATIVE NEGATIVE Final   Influenza B by PCR NEGATIVE NEGATIVE Final    Comment: (NOTE) The Xpert Xpress SARS-CoV-2/FLU/RSV assay is intended as an aid in  the diagnosis of influenza from Nasopharyngeal swab specimens and  should not be used as Marcayla Budge sole basis for treatment. Nasal washings and  aspirates are unacceptable for Xpert Xpress SARS-CoV-2/FLU/RSV  testing.  Fact Sheet for Patients: PinkCheek.be  Fact Sheet for Healthcare  Providers: GravelBags.it  This test is not yet approved or cleared by the [place] FDA and  has been authorized for detection and/or diagnosis of SARS-CoV-2 by  FDA under an Emergency Use Authorization (EUA). This EUA will remain  in effect (meaning this test can be used) for the duration of the  Covid-19 declaration under Section 564(b)(1) of the Act, 21  U.S.C. section 360bbb-3(b)(1), unless the authorization is  terminated or revoked. Performed at [facility], [place] [address] [place], [place] [address]          Radiology Studies: MR THORACIC SPINE  WO CONTRAST  Result Date: 12/20/2019 CLINICAL DATA:  Metastatic esophageal cancer EXAM: MRI THORACIC SPINE WITHOUT CONTRAST TECHNIQUE: Multiplanar, multisequence MR imaging of the thoracic spine was performed. No intravenous contrast was administered. COMPARISON:  None. FINDINGS: Alignment:  Anteroposterior alignment is maintained. Vertebrae: STIR hyperintense lesions throughout the thoracic spine reflecting metastatic disease with involvement of posterior elements. No acute compression deformity. No significant epidural extension. Cord:  Normal caliber and signal. Paraspinal and other soft tissues: Unremarkable. Disc levels: Mild multilevel degenerative disc disease and facet arthropathy. There is no high-grade degenerative stenosis. IMPRESSION: Diffuse osseous metastatic disease. No acute compression deformity or significant epidural extension. Electronically Signed   By: Macy Mis M.D.   On: 12/25/2019 14:37        Scheduled Meds:  atorvastatin  10 mg Oral q1800   divalproex  1,500 mg Oral QHS   enoxaparin (LOVENOX) injection  40 mg Subcutaneous Q24H   feeding supplement  237 mL Oral BID BM   insulin aspart  0-5 Units Subcutaneous QHS   insulin aspart  0-9 Units Subcutaneous TID WC   pantoprazole  40 mg Oral BID   QUEtiapine  25 mg Oral QHS   senna  2 tablet Oral  BID   sucralfate  1 g Oral TID WC & HS   tamsulosin  0.4 mg Oral Daily   Continuous Infusions:  dextrose 5 % and 0.45% NaCl 75 mL/hr at 12/30/19 1600     LOS: 3 days    Time spent: over 30 min    Fayrene Helper, MD [facility]   To contact the attending provider between 7A-7P or the covering provider during after hours 7P-7A, please log into the web site [email or web address] and access using universal [place] password for that web site. If you do not have the password, please call the hospital operator.  12/30/2019, 8:16 PM

## 2019-12-31 ENCOUNTER — Ambulatory Visit: Payer: Medicare Other

## 2019-12-31 DIAGNOSIS — C801 Malignant (primary) neoplasm, unspecified: Secondary | ICD-10-CM

## 2019-12-31 DIAGNOSIS — G9341 Metabolic encephalopathy: Secondary | ICD-10-CM | POA: Diagnosis not present

## 2019-12-31 DIAGNOSIS — Z515 Encounter for palliative care: Secondary | ICD-10-CM | POA: Diagnosis not present

## 2019-12-31 DIAGNOSIS — K2289 Other specified disease of esophagus: Secondary | ICD-10-CM | POA: Diagnosis not present

## 2019-12-31 LAB — CBC WITH DIFFERENTIAL/PLATELET
Abs Immature Granulocytes: 0.27 10*3/uL — ABNORMAL HIGH (ref 0.00–0.07)
Basophils Absolute: 0.1 10*3/uL (ref 0.0–0.1)
Basophils Relative: 1 %
Eosinophils Absolute: 0.2 10*3/uL (ref 0.0–0.5)
Eosinophils Relative: 2 %
HCT: 37.9 % — ABNORMAL LOW (ref 39.0–52.0)
Hemoglobin: 11.5 g/dL — ABNORMAL LOW (ref 13.0–17.0)
Immature Granulocytes: 2 %
Lymphocytes Relative: 12 %
Lymphs Abs: 1.5 10*3/uL (ref 0.7–4.0)
MCH: 27.1 pg (ref 26.0–34.0)
MCHC: 30.3 g/dL (ref 30.0–36.0)
MCV: 89.2 fL (ref 80.0–100.0)
Monocytes Absolute: 1.5 10*3/uL — ABNORMAL HIGH (ref 0.1–1.0)
Monocytes Relative: 13 %
Neutro Abs: 8.3 10*3/uL — ABNORMAL HIGH (ref 1.7–7.7)
Neutrophils Relative %: 70 %
Platelets: 199 10*3/uL (ref 150–400)
RBC: 4.25 MIL/uL (ref 4.22–5.81)
RDW: 14.4 % (ref 11.5–15.5)
WBC: 11.8 10*3/uL — ABNORMAL HIGH (ref 4.0–10.5)
nRBC: 0 % (ref 0.0–0.2)

## 2019-12-31 LAB — COMPREHENSIVE METABOLIC PANEL
ALT: 12 U/L (ref 0–44)
AST: 19 U/L (ref 15–41)
Albumin: 2.5 g/dL — ABNORMAL LOW (ref 3.5–5.0)
Alkaline Phosphatase: 92 U/L (ref 38–126)
Anion gap: 10 (ref 5–15)
BUN: 20 mg/dL (ref 8–23)
CO2: 26 mmol/L (ref 22–32)
Calcium: 10.2 mg/dL (ref 8.9–10.3)
Chloride: 107 mmol/L (ref 98–111)
Creatinine, Ser: 1.51 mg/dL — ABNORMAL HIGH (ref 0.61–1.24)
GFR, Estimated: 50 mL/min — ABNORMAL LOW (ref >60)
Glucose, Bld: 114 mg/dL — ABNORMAL HIGH (ref 70–99)
Potassium: 4.4 mmol/L (ref 3.5–5.1)
Sodium: 143 mmol/L (ref 135–145)
Total Bilirubin: 0.3 mg/dL (ref 0.3–1.2)
Total Protein: 5.8 g/dL — ABNORMAL LOW (ref 6.5–8.1)

## 2019-12-31 LAB — MAGNESIUM: Magnesium: 1.8 mg/dL (ref 1.7–2.4)

## 2019-12-31 LAB — URINE CULTURE: Culture: <10000 — AB

## 2019-12-31 LAB — GLUCOSE, CAPILLARY
Glucose-Capillary: 107 mg/dL — ABNORMAL HIGH (ref 70–99)
Glucose-Capillary: 190 mg/dL — ABNORMAL HIGH (ref 70–99)
Glucose-Capillary: 143 mg/dL — ABNORMAL HIGH (ref 70–99)
Glucose-Capillary: 156 mg/dL — ABNORMAL HIGH (ref 70–99)

## 2019-12-31 LAB — PHOSPHORUS: Phosphorus: 2.8 mg/dL (ref 2.5–4.6)

## 2019-12-31 MED ORDER — DEXTROSE-NACL 5-0.45 % IV SOLN: INTRAVENOUS | Status: AC

## 2019-12-31 MED ORDER — METOPROLOL TARTRATE 5 MG/5ML IV SOLN
5.0000 mg | Freq: Once | INTRAVENOUS | Status: AC
  Administered 2019-12-31: 5 mg via INTRAVENOUS
  Filled 2019-12-31: qty 5

## 2019-12-31 NOTE — Progress Notes (Signed)
Daily Progress Note   Patient Name: Andrew Warren       Date: 12/31/2019 DOB: 1950-10-05  Age: 69 y.o. MRN#: [id number] Attending Physician: Bonnielee Haff, MD Primary Care Physician: [name], MD Admit Date: 12/14/2019  Reason for Consultation/Follow-up: To discuss complex medical decision making related to patient's goals of care  Subjective: Visited patient at bedside.  He is able to tell me he is at [facility], but unable to answer questions about why he is here or what his thoughts are about his care. (confused).  Sister is at bedside.  She is an NP and has travelled from out of state ([place] to support him.    She and patient's wife have expressed frustration about not understanding how advanced the cancer is or being able to read his imaging reports.  I printed the reports and reviewed them with patient's sister.  Andrew Warren is unable to talk with me about his goals or comprehend his current situation.  Sister explains that they understand he can not be cured but would like to buy him more time if possible.  I called patient's wife Andrew Warren.  She has a good understanding of his health status.  She is open to options that will improve his symptoms but does not want his life prolonged in order for him to suffer. Awaiting biopsy results and options to be discussed Mon or Tues.  Assessment: Patient awake and appears comfortable but is confused.   Patient Profile/HPI:  69 y.o. male  with past medical history of rheumatoid arthritis, CKD, DM, and bipoloar disorder.  He was diagnosed with cancer in early October but was in the process of an outpatient work up and was unaware of the extent of his cancer.  He was admitted on 12/16/2019 with encephalopathy and urinary incontinence. Imaging shows  metastasis thru his entire spine encroaching on the S1 nerve root, as well as mets in the liver and thoracoabdominal nodes.  EGD done 11/19 showed a large distal esophageal ulcerating mass.  Biopsies were taken.   Length of Stay: 4   Vital Signs: BP 120/80 (BP Location: Right Arm)   Pulse (!) 122   Temp 98.9 F (37.2 C) (Oral)   Resp 18   Ht 5\' 8"  (1.727 m)   Wt 93.5 kg   SpO2 94%  BMI 31.34 kg/m  SpO2: SpO2: 94 % O2 Device: O2 Device: Nasal Cannula O2 Flow Rate: O2 Flow Rate (L/min): 2 L/min       Palliative Assessment/Data:  30%     Palliative Care Plan    Recommendations/Plan:  Waiting for biopsy results and to have oncology weigh in on treatment options.  Palliative radiation planned (10 treatments) for pain control.  PMT will follow next week after Oncology has presented options.  If patient is confused then wife Andrew Warren is Media planner.  Code Status:  DNR  Prognosis:  Likely weeks to perhaps months without oncologic therapy.  He is appropriate for hospice if he chooses no chemo or radiation.  Discharge Planning:  To Be Determined  Care plan was discussed with Wife and Sister.  Thank you for allowing the Palliative Medicine Team to assist in the care of this patient.  Total time spent:  35 min.     Greater than 50%  of this time was spent counseling and coordinating care related to the above assessment and plan.  Florentina Jenny, PA-C Palliative Medicine  Please contact Palliative MedicineTeam phone at [phone] for questions and concerns between 7 am - 7 pm.   Please see AMION for individual provider pager numbers.

## 2019-12-31 NOTE — Progress Notes (Signed)
   12/31/19 0535  Assess: MEWS Score  Temp 99.7 F (37.6 C)  BP 122/75  Pulse Rate (!) 130  Resp 16  Level of Consciousness Alert  SpO2 97 %  O2 Device Nasal Cannula  O2 Flow Rate (L/min) 2 L/min  Assess: MEWS Score  MEWS Temp 0  MEWS Systolic 0  MEWS Pulse 3  MEWS RR 0  MEWS LOC 0  MEWS Score 3  MEWS Score Color Yellow  Assess: if the MEWS score is Yellow or Red  Were vital signs taken at a resting state? Yes  Focused Assessment Change from prior assessment (see assessment flowsheet)  Early Detection of Sepsis Score *See Row Information* Medium  MEWS guidelines implemented *See Row Information* Yes  Treat  MEWS Interventions Administered prn meds/treatments  Pain Scale 0-10  Pain Score 5  Pain Type Acute pain  Pain Location Shoulder  Pain Orientation Left  Pain Descriptors / Indicators Aching  Pain Frequency Constant  Pain Onset On-going  Patients Stated Pain Goal 2  Pain Intervention(s) Medication (See eMAR)  Take Vital Signs  Increase Vital Sign Frequency  Yellow: Q 2hr X 2 then Q 4hr X 2, if remains yellow, continue Q 4hrs  Escalate  MEWS: Escalate Yellow: discuss with charge nurse/RN and consider discussing with provider and RRT  Notify: Charge Nurse/RN  Name of Charge Nurse/RN Notified [place]  Date Charge Nurse/RN Notified 12/31/19  Time Charge Nurse/RN Notified [id number]  Notify: Provider  Provider Name/Title Blount NP  Date Provider Notified 12/31/19  Time Provider Notified [phone]  Notification Type Page  Notification Reason Change in status  Response See new orders  Date of Provider Response 12/31/19  Time of Provider Response 0545  metoprolol 5mg  iv and MSIR 7.5mg  given , patient denies chest pain or SOB, patient is resting comfortably. Will follow yellow [place] protocols

## 2019-12-31 NOTE — Progress Notes (Signed)
  Radiation Oncology         [phone]) [phone] ________________________________  Name: Andrew Warren MRN: [id number]  Date: 12/25/2019  DOB: 1950/03/07  INPATIENT  SIMULATION AND TREATMENT PLANNING NOTE    ICD-10-CM   1. 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 patient with lumbar sacral metastasis  NARRATIVE:  The patient was brought to the [place].  Identity was confirmed.  All relevant records and images related to the planned course of therapy were reviewed.  The patient freely provided informed written consent to proceed with treatment after reviewing the details related to the planned course of therapy. The consent form was witnessed and verified by the simulation staff.  Then, the patient was set-up in a stable reproducible  supine position for radiation therapy.  CT images were obtained.  Surface markings were placed.  The CT images were loaded into the planning software.  Then the target and avoidance structures were contoured including kidneys.  Treatment planning then occurred.  The radiation prescription was entered and confirmed.  Then, I designed and supervised the construction of a total of 5 medically necessary complex treatment devices with VacLoc positioner and 4 MLCs to shield kidneys and bowel using orthogonal gantry angles.  I have requested : 3D Simulation  I have requested a DVH of the following structures: Left Kidney, Right Kidney and target.  PLAN:  The patient will receive 30 Gy in 10 fractions.  ________________________________  Sheral Apley Tammi Klippel, M.D.

## 2019-12-31 NOTE — Progress Notes (Signed)
PROGRESS NOTE    Andrew Warren  PYY:[id number] DOB: 1950/03/27 DOA: 01/07/2020 PCP: [name], MD   No chief complaint on file.  Brief Narrative:  Andrew Warren is a 69 y.o. male with medical history significant of bipolar disorder, HTN, HLD, and metastatic cancer who presents to the hospital after a clinic visit.    Per oncology note, he was seen by orthopedics outpatient who diagnosed cancer via MRI and he was referred to oncology in early October 10/21.  He has not had any definitive diagnosis with biopsy yet.  He's taking morphine for pain in his back, chest and upper abdomen.  He's noticed recent urinary frequency and incontinence over the past 2 days.  He hasn't had a BM in weeks.  He's slower to speak and respond since heing on morphine.  His wife helps him ambulate, use the bathroom over the past week.  Seems like he's been weaker over the apst several weeks.  He notes he's constipated and has issues urinating.  C/o numbness to groin.  He's unable to clearly describe timing.  He complains of pain, but isn't able to describe this well.  No smoking or etoh per his report.   Assessment & Plan:   Saddle Anesthesia  Incontinence  Constipation  Concern for Cord Compression in setting of metastatic disease:  MRI c spine with scattered osseous metastatic dz involving the cervical spine (see report) MRI with extensive diffuse osseous mets throughout visualized thoracolumbar spine and pelvis.  Early extraosseous extension of tumor at level of the sacrum /S1 segment with tumor encroaching upon the descending S1 nerve roots bilaterally, R > L.  Slight mass effect on the descending R S1 nerve root without overt neural impingement.  No cord or cauda equina compression.  (see report)  thoracic MRI with diffuse osseous metastatic disease MRI brain canceled due to inability to tolerate Per Dr. [name] no compressive lesion in lumbar region or conus.  No surgical intervention was  recommended. 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  Plan is for palliative radiation treatment.  If bx from EGD unrevealing, plan for IR c/s for bx of liver lesion PET 12/12/19 with widespread metastatic dz including to bones, liver, thoracoabdominal nodes, L adrenal gland.  The primary is presumably an area of distal esophageal wall thickening and hypermetabolism given the clinical hx.  See report. Palliative care c/s -> DNR, appreciate palliative recs  Acute Metabolic Encephalopathy Per oncology note, this is related to his morphine.  Ammonia B12 and TSH levels were normal. Unable to tolerate MRI brain due to pain  Mentation seems to be stable though he still very confused.    Diabetes mellitus type 2 SSI. Holding glipizide. Follow A1c (5.9)  Hyperkalemia Patient was given Lokelma with improvement in potassium level.  Hypernatremia Mild.  Noted to be better today.    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  Monitor urine output.  Bipolar disorder On depakote, seroquel at home.  Being continued.  Hyperlipidemia Continue Lipitor  Essential hypertension Holding lisinopril  History of BPH Continue Flomax.    DVT prophylaxis: lovenox Code Status: DNR Family Communication: No family at bedside Disposition: Unclear.  PT and OT evaluation.  Status is: Inpatient  Remains inpatient appropriate because:Inpatient level of care appropriate due to severity of illness   Dispo: The patient is from: Home              Anticipated d/c is to: pending              Anticipated d/c date is: > 3 days              Patient  currently is not medically stable to d/c.  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 Biopsied. - Gastritis. Biopsied. - Erythematous duodenopathy. Biopsied. - Normal second portion of the duodenum. Recommendations Soft diet. - Continue present medications. - Await pathology results. - Follow up with Oncology - Use Protonix (pantoprazole) 40 mg PO BID. - Use sucralfate suspension 1 gram PO QID. - Called his wife and discussed endoscopic findings - GI is available if needed, please call with any questions  Antimicrobials:  Anti-infectives (From admission, onward)   None        Subjective: Patient distracted.  Does not answer any questions.  Objective: Vitals:   12/30/19 1937 12/31/19 0535 12/31/19 0745 12/31/19 0957  BP: 124/73 122/75 108/73 120/80  Pulse: (!) 102 (!) 130 (!) 111 (!) 122  Resp: 16 16 16 18   Temp: 98.4 F (36.9 C) 99.7 F (37.6 C) 99.6 F (37.6 C) 98.9 F (37.2 C)  TempSrc: Oral Oral Oral Oral  SpO2: 91% 97% 94% 94%  Weight:      Height:        Intake/Output Summary (Last 24 hours) at 12/31/2019 1201 Last data filed at 12/31/2019 4665 Gross per 24 hour  Intake 1119.41 ml  Output 3650 ml  Net -2530.59 ml   Filed Weights   01/05/2020 1700 12/28/19 0508  Weight: 92.4 kg 93.5 kg    Examination:  General appearance: Awake alert.  In no distress.  Distracted. Resp: Clear to auscultation bilaterally.  Normal effort Cardio: S1-S2 is normal regular.  No S3-S4.  No rubs murmurs or bruit GI: Abdomen is soft.  Nontender nondistended.  Bowel sounds are present normal.  No masses organomegaly Extremities: Noted to be moving all his extremities Neurologic: Noted to be confused.  No focal neurological deficits.      Data Reviewed: I have personally reviewed following labs and imaging studies  CBC: Recent Labs  Lab 12/12/2019 1748 12/28/19 0555  01/03/2020 0714 12/30/19 0643 12/31/19 0629  WBC 13.1* 12.0* 12.1* 10.2 11.8*  NEUTROABS 9.6*  --  8.3* 7.1 8.3*  HGB 11.9* 11.7* 11.5* 11.1* 11.5*  HCT 38.7* 38.5* 37.5* 38.9* 37.9*  MCV 88.4 89.7 88.0 94.0 89.2  PLT 256 224 213 204 993    Basic Metabolic Panel: Recent Labs  Lab 12/25/2019 1748 12/28/19 0555 12/25/2019 0714 12/30/19 0643 12/31/19 0629  NA 137 140 145 146* 143  K 4.8 5.1 5.1 5.5* 4.4  CL 101 103 108 110 107  CO2 25 26 27 25 26   GLUCOSE 92 96 123* 150* 114*  BUN 51* 45* 34* 30* 20  CREATININE 1.89* 1.77* 1.65* 1.82* 1.51*  CALCIUM 10.9* 10.5* 10.6* 10.0 10.2  MG 2.5*  --  2.3 2.4 1.8  PHOS 4.4  --  3.7 4.6 2.8    GFR: Estimated Creatinine Clearance: 51.9 mL/min (A) (by C-G formula based on SCr of 1.51 mg/dL (H)).  Liver Function  Tests: Recent Labs  Lab 12/17/2019 1748 12/28/19 0555 12/27/2019 0714 12/30/19 0643 12/31/19 0629  AST 19 22 21 23 19   ALT 12 12 13 11 12   ALKPHOS 100 88 96 88 92  BILITOT 0.5 0.4 0.3 0.6 0.3  PROT 6.9 6.3* 6.1* 5.7* 5.8*  ALBUMIN 3.3* 2.9* 2.8* 2.6* 2.5*    CBG: Recent Labs  Lab 12/30/19 1155 12/30/19 1700 12/30/19 2208 12/31/19 0747 12/31/19 1149  GLUCAP 246* 182* 145* 107* 190*     Recent Results (from the past 240 hour(s))  Respiratory Panel by RT PCR (Flu A&B, Covid) - Nasopharyngeal Swab     Status: None   Collection Time: 01/02/2020  5:20 PM   Specimen: Nasopharyngeal Swab  Result Value Ref Range Status   SARS Coronavirus 2 by RT PCR NEGATIVE NEGATIVE Final    Comment: (NOTE) SARS-CoV-2 target nucleic acids are NOT DETECTED.  The SARS-CoV-2 RNA is generally detectable in upper respiratoy specimens during the acute phase of infection. The lowest concentration of SARS-CoV-2 viral copies this assay can detect is 131 copies/mL. A negative result does not preclude SARS-Cov-2 infection and should not be used as the sole basis for treatment or other patient management decisions. A negative result may occur with    improper specimen collection/handling, submission of specimen other than nasopharyngeal swab, presence of viral mutation(s) within the areas targeted by this assay, and inadequate number of viral copies (<131 copies/mL). A negative result must be combined with clinical observations, patient history, and epidemiological information. The expected result is Negative.  Fact Sheet for Patients:  PinkCheek.be  Fact Sheet for Healthcare Providers:  GravelBags.it  This test is no t yet approved or cleared by the [place] FDA and  has been authorized for detection and/or diagnosis of SARS-CoV-2 by FDA under an Emergency Use Authorization (EUA). This EUA will remain  in effect (meaning this test can be used) for the duration of the COVID-19 declaration under Section 564(b)(1) of the Act, 21 U.S.C. section 360bbb-3(b)(1), unless the authorization is terminated or revoked sooner.     Influenza A by PCR NEGATIVE NEGATIVE Final   Influenza B by PCR NEGATIVE NEGATIVE Final    Comment: (NOTE) The Xpert Xpress SARS-CoV-2/FLU/RSV assay is intended as an aid in  the diagnosis of influenza from Nasopharyngeal swab specimens and  should not be used as a sole basis for treatment. Nasal washings and  aspirates are unacceptable for Xpert Xpress SARS-CoV-2/FLU/RSV  testing.  Fact Sheet for Patients: PinkCheek.be  Fact Sheet for Healthcare Providers: GravelBags.it  This test is not yet approved or cleared by the [place] FDA and  has been authorized for detection and/or diagnosis of SARS-CoV-2 by  FDA under an Emergency Use Authorization (EUA). This EUA will remain  in effect (meaning this test can be used) for the duration of the  Covid-19 declaration under Section 564(b)(1) of the Act, 21  U.S.C. section 360bbb-3(b)(1), unless the authorization is  terminated or  revoked. Performed at [facility], [place] [address] [place], [place] [address]   Culture, Urine     Status: Abnormal   Collection Time: 01/07/2020  9:45 PM   Specimen: Urine, Random  Result Value Ref Range Status   Specimen Description   Final    URINE, RANDOM Performed at [place] [address], [place], [place] [address]    Special Requests   Final    NONE Performed at [facility], [place] [address] [place],  [place] [address]    Culture (A)  Final    <10,000 COLONIES/mL INSIGNIFICANT GROWTH Performed at [facility], [place] [address], [place], [place] [address]    Report Status 12/31/2019 FINAL  Final         Radiology Studies: No results found.    Scheduled Meds: . atorvastatin  10 mg Oral q1800  . divalproex  1,500 mg Oral QHS  . enoxaparin (LOVENOX) injection  40 mg Subcutaneous Q24H  . feeding supplement  237 mL Oral BID BM  . insulin aspart  0-5 Units Subcutaneous QHS  . insulin aspart  0-9 Units Subcutaneous TID WC  . pantoprazole  40 mg Oral BID  . QUEtiapine  25 mg Oral QHS  . senna  2 tablet Oral BID  . sucralfate  1 g Oral TID WC & HS  . tamsulosin  0.4 mg Oral Daily   Continuous Infusions:    LOS: 4 days     Bonnielee Haff, MD [facility]   To contact the attending provider between 7A-7P or the covering provider during after hours 7P-7A, please log into the web site [email or web address] and access using universal [place] password for that web site. If you do not have the password, please call the hospital operator.  12/31/2019, 12:01 PM

## 2020-01-01 ENCOUNTER — Other Ambulatory Visit: Payer: Self-pay

## 2020-01-01 ENCOUNTER — Ambulatory Visit: Admit: 2020-01-01 | Payer: Medicare Other

## 2020-01-01 ENCOUNTER — Encounter (HOSPITAL_COMMUNITY): Payer: Self-pay | Admitting: Gastroenterology

## 2020-01-01 DIAGNOSIS — G9341 Metabolic encephalopathy: Secondary | ICD-10-CM | POA: Diagnosis not present

## 2020-01-01 DIAGNOSIS — C159 Malignant neoplasm of esophagus, unspecified: Secondary | ICD-10-CM

## 2020-01-01 DIAGNOSIS — K2289 Other specified disease of esophagus: Secondary | ICD-10-CM | POA: Diagnosis not present

## 2020-01-01 DIAGNOSIS — C801 Malignant (primary) neoplasm, unspecified: Secondary | ICD-10-CM | POA: Diagnosis not present

## 2020-01-01 LAB — CBC
HCT: 38.2 % — ABNORMAL LOW (ref 39.0–52.0)
Hemoglobin: 11.4 g/dL — ABNORMAL LOW (ref 13.0–17.0)
MCH: 27 pg (ref 26.0–34.0)
MCHC: 29.8 g/dL — ABNORMAL LOW (ref 30.0–36.0)
MCV: 90.3 fL (ref 80.0–100.0)
Platelets: 189 10*3/uL (ref 150–400)
RBC: 4.23 MIL/uL (ref 4.22–5.81)
RDW: 14.3 % (ref 11.5–15.5)
WBC: 12.3 10*3/uL — ABNORMAL HIGH (ref 4.0–10.5)
nRBC: 0 % (ref 0.0–0.2)

## 2020-01-01 LAB — COMPREHENSIVE METABOLIC PANEL
ALT: 13 U/L (ref 0–44)
AST: 19 U/L (ref 15–41)
Albumin: 2.5 g/dL — ABNORMAL LOW (ref 3.5–5.0)
Alkaline Phosphatase: 90 U/L (ref 38–126)
Anion gap: 11 (ref 5–15)
BUN: 24 mg/dL — ABNORMAL HIGH (ref 8–23)
CO2: 26 mmol/L (ref 22–32)
Calcium: 10.6 mg/dL — ABNORMAL HIGH (ref 8.9–10.3)
Chloride: 104 mmol/L (ref 98–111)
Creatinine, Ser: 1.68 mg/dL — ABNORMAL HIGH (ref 0.61–1.24)
GFR, Estimated: 44 mL/min — ABNORMAL LOW (ref >60)
Glucose, Bld: 119 mg/dL — ABNORMAL HIGH (ref 70–99)
Potassium: 4.3 mmol/L (ref 3.5–5.1)
Sodium: 141 mmol/L (ref 135–145)
Total Bilirubin: 0.4 mg/dL (ref 0.3–1.2)
Total Protein: 5.7 g/dL — ABNORMAL LOW (ref 6.5–8.1)

## 2020-01-01 LAB — GLUCOSE, CAPILLARY
Glucose-Capillary: 88 mg/dL (ref 70–99)
Glucose-Capillary: 113 mg/dL — ABNORMAL HIGH (ref 70–99)
Glucose-Capillary: 154 mg/dL — ABNORMAL HIGH (ref 70–99)
Glucose-Capillary: 124 mg/dL — ABNORMAL HIGH (ref 70–99)

## 2020-01-01 MED ORDER — HALOPERIDOL LACTATE 5 MG/ML IJ SOLN
1.0000 mg | Freq: Four times a day (QID) | INTRAMUSCULAR | Status: DC | PRN
  Administered 2020-01-02: 1 mg via INTRAVENOUS
  Filled 2020-01-01: qty 1

## 2020-01-01 NOTE — Progress Notes (Addendum)
HEMATOLOGY-ONCOLOGY PROGRESS NOTE  SUBJECTIVE: More alert today.  He knows where he is and he knows his name.  He thinks it is 2023.  Reports that he has pain "all over."  He offers no other complaints today.  No family at the bedside.  Oncology History Overview Note  Cancer Staging No matching staging information was found for the patient.    Esophageal cancer, stage IV ([place])  11/24/2019 Tumor Marker   CA 19-9 - 264.18   11/28/2019 Imaging   CT CAP 11/28/19  1. Minimal coronary artery calcifications are noted suggesting  coronary artery disease.  2. 14 mm nodular density is noted in right upper lobe with central  calcification and is most consistent with benign granuloma or  hamartoma. 3 mm nodule is noted anteriorly in the left upper lobe.  Follow-up unenhanced chest CT in 12 months is recommended to ensure  stability and rule out neoplasm.  3. Small scattered calcifications are seen throughout the pancreas  suggesting chronic pancreatitis.  4. Diverticulosis of descending and sigmoid colon is noted without  inflammation.  5. 2.6 cm low density is seen involving anterior portion of midpole  of right kidney most consistent with cyst, but renal ultrasound is  recommended for confirmation and to rule out neoplasm.  Aortic Atherosclerosis (ICD10-I70.0).    12/12/2019 PET scan   PET 12/12/19  IMPRESSION: 1. Widespread metastatic disease, including to bones, liver, thoracoabdominal nodes, left adrenal gland. The primary is presumably an area of distal esophageal wall thickening and hypermetabolism, given the clinical history. 2. Incidental findings, including: Aortic atherosclerosis (ICD10-I70.0), coronary artery atherosclerosis and emphysema (ICD10-J43.9). Pulmonary artery enlargement suggests pulmonary arterial hypertension.   12/18/2019 Imaging   US Abdomen 12/18/19  1. Multiple hepatic hypoechoic lesions concerning for metastatic  disease. Further characterization with MRI  without and with contrast  recommended.  2. Bilateral renal parenchyma atrophy and increased echogenicity,  likely chronic kidney disease. No hydronephrosis or shadowing stone.  3. Echogenic lesion along the posterior mid to lower pole of the  left kidney, indeterminate. Attention on MRI recommended.     12/24/2019 Initial Diagnosis   Esophageal cancer, stage IV ([facility])      REVIEW OF SYSTEMS:   Reports pain all over otherwise negative.  I have reviewed the past medical history, past surgical history, social history and family history with the patient and they are unchanged from previous note.   PHYSICAL EXAMINATION: ECOG PERFORMANCE STATUS: 3 - Symptomatic, >50% confined to bed  Vitals:   12/31/19 2004 01/01/20 0459  BP: 107/73 115/72  Pulse: (!) 103 (!) 110  Resp: 16 16  Temp: 97.6 F (36.4 C) 98.9 F (37.2 C)  SpO2: 99% 97%   Filed Weights   12/18/2019 1700 12/28/19 0508  Weight: 92.4 kg 93.5 kg    Intake/Output from previous day: 11/21 0701 - 11/22 0700 In: 118 [P.O.:118] Out: 2250 [Urine:2250]  GENERAL: Awake and alert, slow to answer questions SKIN: skin color, texture, turgor are normal, no rashes or significant lesions EYES: normal, Conjunctiva are pink and non-injected, sclera clear LUNGS: clear to auscultation and percussion with normal breathing effort HEART: regular rate & rhythm and no murmurs and no lower extremity edema ABDOMEN:abdomen soft, non-tender and normal bowel sounds Musculoskeletal:no cyanosis of digits and no clubbing  NEURO: Alert and oriented to person and place  LABORATORY DATA:  I have reviewed the data as listed CMP Latest Ref Rng & Units 01/01/2020 12/31/2019 12/30/2019  Glucose 70 - 99 mg/dL 119(H) 114(H)  150(H)  BUN 8 - 23 mg/dL 24(H) 20 30(H)  Creatinine 0.61 - 1.24 mg/dL 1.68(H) 1.51(H) 1.82(H)  Sodium 135 - 145 mmol/L 141 143 146(H)  Potassium 3.5 - 5.1 mmol/L 4.3 4.4 5.5(H)  Chloride 98 - 111 mmol/L 104 107 110  CO2 22 - 32  mmol/L _0 Calcium 8.9 - 10.3 mg/dL 10.6(H) 10.2 10.0  Total Protein 6.5 - 8.1 g/dL 5.7(L) 5.8(L) 5.7(L)  Total Bilirubin 0.3 - 1.2 mg/dL 0.4 0.3 0.6  Alkaline Phos 38 - 126 U/L 90 92 88  AST 15 - 41 U/L _1 ALT 0 - 44 U/L _2 Lab Results  Component Value Date   WBC 12.3 (H) 01/01/2020   HGB 11.4 (L) 01/01/2020   HCT 38.2 (L) 01/01/2020   MCV 90.3 01/01/2020   PLT 189 01/01/2020   NEUTROABS 8.3 (H) 12/31/2019    MR CERVICAL SPINE WO CONTRAST  Result Date: 12/26/2019 CLINICAL DATA:  Initial evaluation for concern for metastatic disease, incontinence, back pain. EXAM: MRI CERVICAL SPINE WITHOUT CONTRAST TECHNIQUE: Multiplanar, multisequence MR imaging of the cervical spine was performed. No intravenous contrast was administered. COMPARISON:  Prior PET-CT from 12/12/2019 FINDINGS: Alignment: Examination moderately degraded by motion artifact. Straightening of the normal cervical lordosis.  No listhesis. Vertebrae: Scattered osseous metastatic disease seen involving the cervical spine, with definite involvement of the C7, T1, T2, and T3 vertebral bodies. Abnormal signal intensity involving the C5 and C6 vertebral bodies could be related to metastatic disease or possibly reactive degenerative change. Metastatic lesions noted involving the occipital condyles bilaterally. Abnormal edema centered about the right C7-T1 facet likely reflects osseous metastatic disease as well (series 11, image 4). Probable extraosseous extension into the adjacent soft tissues at this level (series 12, image 29) no other definite or convincing extraosseous extension seen on this motion degraded exam. No appreciable epidural involvement. Vertebral body height maintained without pathologic fracture. Cord: Signal intensity within the cervical spinal cord is grossly within normal limits. No convincing cord signal abnormality on this motion degraded exam. Posterior Fossa, vertebral arteries, paraspinal  tissues: Retro cerebellar cyst versus mega cisterna magna noted at the visualized posterior fossa. Craniocervical junction within normal limits. Paraspinous soft tissues otherwise unremarkable. Normal flow voids seen within the vertebral arteries bilaterally. Disc levels: C2-C3: Negative interspace. Moderate right with mild left facet degeneration. No significant canal or foraminal stenosis. C3-C4: Minimal disc bulge. Mild to moderate right with mild left facet hypertrophy. No significant spinal stenosis. Foramina remain patent. C4-C5: Shallow central disc osteophyte complex indents the ventral thecal sac. Mild spinal stenosis without significant cord deformity. Foramina remain patent. C5-C6: Broad-based posterior disc osteophyte complex flattens and indents the ventral thecal sac, slightly eccentric to the left. Resultant mild spinal stenosis with mild flattening of the left hemi cord. No definite cord signal changes. Superimposed right greater than left uncovertebral hypertrophy with no more than mild bilateral C6 foraminal narrowing. C6-C7: Degenerative intervertebral disc space narrowing with diffuse disc osteophyte. Mild flattening of the ventral thecal sac without significant spinal stenosis. Right greater than left uncovertebral spurring with resultant moderate right C7 foraminal stenosis. No significant left foraminal narrowing. C7-T1: Minimal disc bulge. Osseous metastatic disease involves the right C7-T1 facet. Probable involvement of the adjacent right T1 transverse process and posterior right first rib (series 12, images 29, 30). No significant epidural extension at this time. Spinal canal remains patent. Neural foramina remain grossly patent as well. IMPRESSION: 1. Scattered osseous metastatic  disease involving the cervical spine as above. No significant epidural extension at this time. No pathologic fracture or other complication. 2. Metastatic deposit centered about the right C7-T1 facet with  associated extraosseous extension into the surrounding soft tissues as above. No other significant extra osseous disease identified on this motion degraded exam. 3. Moderate multilevel cervical spondylosis as above, with resultant mild spinal stenosis at C4-5 through C6-7. Moderate right C7 foraminal narrowing related to disc osteophyte and uncovertebral disease. Electronically Signed   By: Jeannine Boga M.D.   On: 12/28/2019 21:59   MR THORACIC SPINE WO CONTRAST  Result Date: 12/31/2019 CLINICAL DATA:  Metastatic esophageal cancer EXAM: MRI THORACIC SPINE WITHOUT CONTRAST TECHNIQUE: Multiplanar, multisequence MR imaging of the thoracic spine was performed. No intravenous contrast was administered. COMPARISON:  None. FINDINGS: Alignment:  Anteroposterior alignment is maintained. Vertebrae: STIR hyperintense lesions throughout the thoracic spine reflecting metastatic disease with involvement of posterior elements. No acute compression deformity. No significant epidural extension. Cord:  Normal caliber and signal. Paraspinal and other soft tissues: Unremarkable. Disc levels: Mild multilevel degenerative disc disease and facet arthropathy. There is no high-grade degenerative stenosis. IMPRESSION: Diffuse osseous metastatic disease. No acute compression deformity or significant epidural extension. Electronically Signed   By: Macy Mis M.D.   On: 12/28/2019 14:37   MR LUMBAR SPINE WO CONTRAST  Result Date: 01/05/2020 CLINICAL DATA:  Initial evaluation for back pain, incontinence, metastatic disease evaluation. EXAM: MRI LUMBAR SPINE WITHOUT CONTRAST TECHNIQUE: Multiplanar, multisequence MR imaging of the lumbar spine was performed. No intravenous contrast was administered. COMPARISON:  Prior PET-CT from 12/12/2019 FINDINGS: Segmentation: Standard. Lowest well-formed disc space labeled the L5-S1 level. Alignment: 2 mm retrolisthesis of L2 on L3. Alignment otherwise normal with preservation of the  normal lumbar lordosis. Vertebrae: Extensive diffuse osseous metastases seen throughout the visualized thoracolumbar spine as well as the sacrum and pelvis. There is involvement of the centrally all levels. For reference purposes, largest of these lesions involves the left anterior aspect of the L2 vertebral body and measures approximately 3.2 cm in greatest dimension (series 11, image 11). Vertebral body height maintained without pathologic fracture. There is early extraosseous extension at the level of the sacrum/S1 segment, with tumor encroaching upon the descending S1 nerve roots bilaterally, right greater than left (series 13, image 34). Slight mass effect on the descending right S1 nerve root which is somewhat flattened and displaced posteriorly. No overt neural impingement. No other significant extra osseous extension of tumor seen elsewhere within the lumbar spine at this time. No other significant epidural extension. Conus medullaris and cauda equina: Conus extends to the T12-L1 level. Conus and cauda equina appear normal. No evidence for compression of the distal cord or nerve roots of the cauda equina. Paraspinal and other soft tissues: Left periaortic/retroperitoneal lymph nodes measuring up to 9 mm noted (series 12, image 5), nonspecific, but could reflect nodal metastases. Atherosclerotic change noted within the visualized aorta. Multiple scattered cystic lesions noted about the kidneys, several of which demonstrate intrinsic T1 hyperintensity within the right kidney (series 13, image 10). Findings are indeterminate, and could reflect proteinaceous and/or hemorrhagic cyst. These are not well assessed on this noncontrast examination. Paraspinous soft tissues demonstrate no other acute finding. Disc levels: T12-L1: Mild diffuse disc bulge with disc desiccation. Chronic endplate Schmorl's node deformities. No significant spinal stenosis. Foramina remain patent. L1-2: Mild disc bulge with disc desiccation.  Chronic endplate Schmorl's node deformity. Mild facet hypertrophy. No spinal stenosis. Foramina remain patent. L2-3: Trace retrolisthesis.  Degenerative intervertebral disc space narrowing with mild diffuse disc bulge and disc desiccation. Mild left greater than right facet hypertrophy. Resultant mild narrowing of the lateral recesses bilaterally, left greater than right. Central canal remains patent. No significant foraminal encroachment. L3-4: Disc desiccation with mild disc bulge. Central annular fissure. Mild bilateral facet hypertrophy. Resultant mild lateral recess narrowing bilaterally. Central canal remains patent. No significant foraminal encroachment. L4-5: Disc desiccation with mild diffuse disc bulge. Central annular fissure. Mild facet and ligament flavum hypertrophy. Resultant mild narrowing of the right lateral recess. Foramina remain patent. L5-S1: Disc desiccation without significant disc bulge. Severe right with moderate left facet degeneration. Mild epidural lipomatosis. No significant canal or foraminal stenosis. IMPRESSION: 1. Extensive diffuse osseous metastases throughout the visualized thoracolumbar spine and pelvis. No pathologic fracture. 2. Early extraosseous extension of tumor at the level of the sacrum/S1 segment, with tumor encroaching upon the descending S1 nerve roots bilaterally, right greater than left. Slight mass effect on the descending right S1 nerve root without overt neural impingement. 3. No other significant extra osseous extension or epidural tumor elsewhere within the lumbar spine. No cord or cauda equina compression. 4. Left periaortic/retroperitoneal lymph nodes measuring up to 9 mm, nonspecific, but could reflect nodal metastases. 5. Mild multilevel degenerative spondylosis with resultant mild lateral recess narrowing bilaterally at L2-3 and L3-4, and on the right at L4-5. Electronically Signed   By: Jeannine Boga M.D.   On: 01/02/2020 21:46   NM PET Image  Initial (PI) Skull Base To Thigh  Result Date: 12/12/2019 CLINICAL DATA:  Initial treatment strategy for neoplasm of esophagus. Bone metastasis. COVID vaccine in left arm 5/21 EXAM: NUCLEAR MEDICINE PET SKULL BASE TO THIGH TECHNIQUE: 11.1 mCi F-18 FDG was injected intravenously. Full-ring PET imaging was performed from the skull base to thigh after the radiotracer. CT data was obtained and used for attenuation correction and anatomic localization. Fasting blood glucose: 89 mg/dl COMPARISON:  11/20/2019 chest abdomen and pelvic CTs. FINDINGS: Mediastinal blood pool activity: SUV max 3.3 Liver activity: SUV max NA NECK: Laryngeal hypermetabolism is favored to be physiologic, without well-defined CT correlate. No cervical nodal hypermetabolism. Incidental CT findings: No cervical adenopathy. CHEST: Small hypermetabolic mediastinal nodes, including a prevascular node of 7 mm and a S.U.V. max of 12.4 on 63/4. Distal esophageal hypermetabolism with mild wall thickening, including at a S.U.V. max of 18.6 on 97/4. Incidental CT findings: Aortic and coronary artery atherosclerosis. Pulmonary artery enlargement, outflow tract 3.3 cm. Centrilobular and paraseptal emphysema. Central right upper lobe 1.3 cm pulmonary nodule is again favored to represent a hamartoma or granuloma, given central calcification. Not hypermetabolic. ABDOMEN/PELVIS: Bilateral hepatic metastasis which are relatively CT occult. Example in the anterior left hepatic lobe at a S.U.V. max of 13.3 and within the central high right hepatic lobe at a S.U.V. max of 12.9. Abdominal nodal metastasis, with index portacaval node measuring 1.1 cm and a S.U.V. max of 15.9 on 112/4. Multifocal right-sided colonic hypermetabolism is favored to be physiologic. Left adrenal hypermetabolic nodule measures 1.1 cm and a S.U.V. max of 15.0 on 108/4 Incidental CT findings: Extensive colonic diverticulosis. Cholecystectomy. Abdominal aortic atherosclerosis. Nonaneurysmal  dilatation at 2.8 cm. SKELETON: Widespread osseous metastasis. The previously described left scapular/coracoid lesion measures 4.5 cm and a S.U.V. max of 21.0 on 37/4. A sacral lytic lesion measures a S.U.V. max of 16.7. Incidental CT findings: none IMPRESSION: 1. Widespread metastatic disease, including to bones, liver, thoracoabdominal nodes, left adrenal gland. The primary is presumably an area of  distal esophageal wall thickening and hypermetabolism, given the clinical history. 2. Incidental findings, including: Aortic atherosclerosis (ICD10-I70.0), coronary artery atherosclerosis and emphysema (ICD10-J43.9). Pulmonary artery enlargement suggests pulmonary arterial hypertension. Electronically Signed   By: Abigail Miyamoto M.D.   On: 12/12/2019 09:20   DG CHEST PORT 1 VIEW  Result Date: 01/03/2020 CLINICAL DATA:  Metastatic cancer EXAM: PORTABLE CHEST 1 VIEW COMPARISON:  03/26/2014 FINDINGS: Calcified granuloma in the right mid lung. Heart is normal size. No confluent opacities or effusions. No acute bony abnormality. IMPRESSION: No active disease. Electronically Signed   By: Rolm Baptise M.D.   On: 12/18/2019 20:25    ASSESSMENT AND PLAN: 1.  Diffuse metastatic cancer to the bones, liver, nodes, left adrenal gland, likely metastatic esophageal cancer 2.  Acute metabolic encephalopathy 3.  Mild anemia 4.  AKI 5.  Hypercalcemia 6.  Pain secondary to bone metastasis 7.  Bipolar disorder 8.  Diabetes mellitus 9.  Rheumatoid arthritis  -EGD showed a large, ulcerating mass with bleeding in the distal esophagus, biopsy was taken, pathology is pending.  This is likely the primary esophageal cancer.  Pathology pending and anticipate preliminary report later today.  We will discussed with the patient and his wife once results are available.  Due to his poor performance status, is not a candidate for intensive chemotherapy but may be considered for low intensity chemotherapy and immunotherapy. Further  recommendations for treatment pending these results. -Palliative radiation to begin today. -Palliative care team is following for symptom management and ongoing goals of care discussion. -We will request molecular testing on his biopsy sample. -Corrected calcium 11.4 today.  Creatinine also up slightly.  Hospitalist increasing IV fluids.  May need to consider for bisphosphonate therapy.  Mikey Bussing  01/01/2020   Addendum  I have seen the patient, examined him. I agree with the assessment and and plan and have edited the notes.   Patient's esophageal mass biopsy came back poorly differentiated adenocarcinoma.  I spoke with patient, his wife at bedside, and his sister on the phone.  We discussed the incurable nature and guarded prognosis, due to the high tumor burden, poor performance status and his medical comorbidities.  I recommend him to continue palliative radiation, which will complete on 01/17/2020.  There will be a very challenge for patient's wife to bring him back for daily radiation, and he will remain in hospital until he is able to move around independently, or we can check if he can go to SNF for rehab finish RT while he is there.   I have requested PD-L1, HER-2, and MSI test to see if he is a candidate for immunotherapy and or targeted therapy.  Patient is not a candidate for intensive chemotherapy such as FOLFOX, may consider single agent 5-FU or Xeloda with Keytruda if PD-L1 positive or MSI high disease.  It would be also very reasonable to consider palliative care alone and hospice, to preserve his quality of life.  I discussed the above with the his wife [name] and sister Manuela Schwartz in details.  They would like to wait and see how he does in the next few weeks.  I do not plan to start any systemic treatment before he completes radiation.  [name] and Manuela Schwartz agrees with DNR/DNI.   I will f/u. Please watch his calcium and kidney function. Will consider pamidronate if his corrected calcium is  above 12. I ordered uric acid level tomorrow morning.   I spent a total of 60 mins for his care  today.   Truitt Merle  01/01/2020

## 2020-01-01 NOTE — Evaluation (Signed)
Occupational Therapy Evaluation Patient Details Name: Andrew Warren MRN: [id number] DOB: [birth year] Today's Date: 01/01/2020    History of Present Illness Andrew Warren is a 69 y.o. male with medical history significant of bipolar disorder, HTN, HLD, and metastatic cancer who presents to the hospital after a clinic visit.   Per oncology note, he was seen by orthopedics outpatient who diagnosed cancer via MRI and he was referred to oncology in early October 10/21.  PET scan on 12/12/19 revealed: Widespread metastatic disease, including to bones, liver,thoracoabdominal nodes, left adrenal gland.  Pt's spouse also reported a recent LT shoulder fracture "from a tumor". Stated that an Orthopedic placed a sling on the pt who could not tolerate it and removed it on the car ride home.  No available LT shoulder imaging at this time, although noted an order.   Clinical Impression   An occupational therapy evaluation was completed on this 69 year old, right handed,  male. Patient is currently requiring assistance with ADLs including Total assistance with toileting and LE dressing, maximum assist with bathing and UE dressing, moderate assist with simple grooming and minimal to moderate assist with feeding, all of which is below patient's typical baseline of being Independent prior to October of 2021.  During this evaluation, patient was limited by confusion/impaired processing, impulsivity with impaired safety awareness, generalized weakness, reported recent LT shoulder fracture, and impaired activity tolerance, all of which has the potential to impact patient's and caregiver's safety and independence during functional mobility, as well as performance for ADLs. [place] "6-clicks" Daily Activity Inpatient Short Form score of 11/24 indicates 70.42% ADL impairment this session. Patient lives with his spouse and son, who are not able to physically provide 24/7 supervision and assistance safely  at this time.  Depending on prognosis, patient may benefit from continued skilled occupational therapy services while in acute care to maximize safety, independence and quality of life at home.  Continued occupational therapy services in a SNF setting prior to return home is recommended.  ?   Follow Up Recommendations  SNF;Supervision/Assistance - 24 hour    Equipment Recommendations  Hospital bed;3 in 1 bedside commode;Tub/shower seat;Toilet rise with handles;Wheelchair (measurements OT)    Recommendations for Other Services       Precautions / Restrictions Restrictions Weight Bearing Restrictions: Yes LUE Weight Bearing: Non weight bearing Other Position/Activity Restrictions: Treating LT shoulder as NWB due to spouse report of recent LT shoulder fracture and orthopedic placing pt in sling recently.  Pt is non-compliant with sling which is not in room.  No WB orders in chart.      Mobility Bed Mobility Overal bed mobility: Needs Assistance Bed Mobility: Supine to Sit;Sit to Supine     Supine to sit: Max assist;HOB elevated Sit to supine: Mod assist   General bed mobility comments: Sit to supine with need of assist to both LEs and trunk and increased assistance due to confusion, and pt atetmpting to place feet back on bed.  Sit to supine with Mod As to raise LEs onto bed.  Total assist of 2 for supine scoot to HOB.    Transfers Overall transfer level: Needs assistance Equipment used: Rolling walker (2 wheeled) Transfers: Sit to/from Stand Sit to Stand: Mod assist         General transfer comment: Verbal cues for power up and sequencing.    Balance Overall balance assessment: Needs assistance Sitting-balance support: Feet supported Sitting balance-Leahy Scale: Poor Sitting balance - Comments: Poor sitting  vs impulsivity and attempting to lean back onto bed. Postural control: Posterior lean;Right lateral lean;Left lateral lean Standing balance support: Bilateral upper  extremity supported Standing balance-Leahy Scale: Poor Standing balance comment: Dependent on walker and assistance.             High level balance activites: Side stepping High Level Balance Comments: Constant cues for sequence, RW and Min As for latearl steps to [facility].           ADL either performed or assessed with clinical judgement   ADL Overall ADL's : Needs assistance/impaired Eating/Feeding: Minimal assistance;Supervision/ safety;Set up;Cueing for sequencing;Cueing for safety;Bed level   Grooming: Sitting;Set up;Minimal assistance;Wash/dry face;Cueing for sequencing;Cueing for safety   Upper Body Bathing: Moderate assistance;Sitting;Cueing for safety;Cueing for sequencing Upper Body Bathing Details (indicate cue type and reason): Based on general assessment. Lower Body Bathing: Total assistance;Bed level;Cueing for sequencing   Upper Body Dressing : Maximal assistance;Bed level Upper Body Dressing Details (indicate cue type and reason): Pt without gown or covers as OT entered room.  RN notified that pt has been removing his gowns. Pt did agree to allow OT to don gown and left gown in place for remainder of visit.  Pt required Max cues to raise UEs slightly off bed to assist with dressing. Lower Body Dressing: Total assistance;Sitting/lateral leans Lower Body Dressing Details (indicate cue type and reason): To don socks.  Pt did kick feet forward off floor with verbal cues to assist.   Toilet Transfer Details (indicate cue type and reason): Please see mobility section. Toileting- Clothing Manipulation and Hygiene: Total assistance;Sit to/from stand Toileting - Clothing Manipulation Details (indicate cue type and reason): Pt stood with BUE support on RW and required total assist for posterior peri hygiene.  Cues to remain standing as needed to complete task.     Functional mobility during ADLs: Moderate assistance;Minimal assistance;Rolling walker;Cueing for safety;Cueing for  sequencing       Vision   Additional Comments: Per spouse, pt wear reading glasses.  Pt with poor visual attention to task or person. Fleeting eye contact with spouse.  Pt denied visual impairment. Pt did not respond when asked to read clock on wall.     Perception     Praxis      Pertinent Vitals/Pain Pain Assessment: No/denies pain     Hand Dominance Right (Pt stated Left. Spouse corrected with Right. Pt favoring RT hand.)   Extremity/Trunk Assessment Upper Extremity Assessment Upper Extremity Assessment: Generalized weakness;LUE deficits/detail LUE Deficits / Details: NT proximally due to recent LT shoulder fracture per wife report.  Grip: ~3+/5   Lower Extremity Assessment Lower Extremity Assessment: Defer to PT evaluation   Cervical / Trunk Assessment Cervical / Trunk Assessment: Normal   Communication Communication Communication: Expressive difficulties (Very delayed responses and mute at times.)   Cognition Arousal/Alertness: Awake/alert Behavior During Therapy: Flat affect Overall Cognitive Status: Impaired/Different from baseline Area of Impairment: Orientation;Attention;Memory;Safety/judgement;Following commands;Awareness;Problem solving                 Orientation Level: Place;Time;Situation Current Attention Level: Focused Memory: Decreased recall of precautions;Decreased short-term memory Following Commands: Follows one step commands inconsistently;Follows one step commands with increased time Safety/Judgement: Decreased awareness of safety;Decreased awareness of deficits Awareness: Intellectual Problem Solving: Slow processing;Decreased initiation;Requires verbal cues;Requires tactile cues;Difficulty sequencing     General Comments       Exercises     Shoulder Instructions      Home Living Family/patient expects to be discharged to:: Skilled  nursing facility Living Arrangements: Spouse/significant other Available Help at Discharge:  Family Type of Home: House Home Access: Stairs to enter [employer] of Steps: 6 Entrance Stairs-Rails: None Home Layout: One level;Other (Comment) (1 step down to a sunken TV room where pt's recliner is located.)     Bathroom Shower/Tub: Occupational psychologist: Standard     Home Equipment: Environmental consultant - 4 wheels;Cane - single point          Prior Functioning/Environment Level of Independence: Needs assistance  Gait / Transfers Assistance Needed: Prior to October 2021, pt was independent with all mobility.  Since ~mid-October, pt has required assistance from spouse with bed mobility, transfers, navigating stairs into house which pt's wife stated feels unsafe for them both, as well as assisance with ADLs.  Wife reports pt has had 2 falls in past 2 weeks. ADL's / Homemaking Assistance Needed: Need for increasing amounts of assistance since mid-October.  Pt was Independent and driving a semi-trunk prior to October.            OT Problem List: Decreased strength;Decreased coordination;Decreased cognition;Decreased activity tolerance;Decreased safety awareness;Decreased knowledge of use of DME or AE;Impaired balance (sitting and/or standing);Impaired vision/perception;Decreased knowledge of precautions      OT Treatment/Interventions: Self-care/ADL training;Therapeutic exercise;Therapeutic activities;Neuromuscular education;Cognitive remediation/compensation;Visual/perceptual remediation/compensation;DME and/or AE instruction;Patient/family education;Balance training    OT Goals(Current goals can be found in the care plan section) Acute Rehab OT Goals Patient Stated Goal: Spouse agrees with OT recommednation for SNF, but awaiting more information on pt's prognosis to determine better next steps. OT Goal Formulation: Patient unable to participate in goal setting ADL Goals Pt Will Perform Grooming: with supervision;sitting Pt Will Perform Upper Body Bathing: sitting;with  supervision;with set-up Pt Will Perform Lower Body Bathing: with min assist;sitting/lateral leans;sit to/from stand Pt Will Perform Upper Body Dressing: with min assist;sitting Pt Will Perform Lower Body Dressing: with min assist;sit to/from stand;sitting/lateral leans;bed level Pt Will Transfer to Toilet: with supervision;bedside commode  OT Frequency: Min 2X/week   Barriers to D/C:    Spouse with no other assisance at home except son who is currently injured and when healed, must return to work. Pt is up nights and spouse has been unable to sleep more than 2 hours at a time due to need of constant, close supervision and physical assistance.       Co-evaluation              AM-PAC OT "6 Clicks" Daily Activity     Outcome Measure Help from another person eating meals?: A Little Help from another person taking care of personal grooming?: A Lot Help from another person toileting, which includes using toliet, bedpan, or urinal?: Total Help from another person bathing (including washing, rinsing, drying)?: A Lot Help from another person to put on and taking off regular upper body clothing?: A Lot Help from another person to put on and taking off regular lower body clothing?: Total 6 Click Score: 11   End of Session Equipment Utilized During Treatment: Gait belt;Rolling walker Nurse Communication: Mobility status  Activity Tolerance: Patient limited by fatigue Patient left: in bed;with call bell/phone within reach;with bed alarm set;with family/visitor present  OT Visit Diagnosis: Unsteadiness on feet (R26.81);Other symptoms and signs involving cognitive function;History of falling (Z91.81);Repeated falls (R29.6);Muscle weakness (generalized) (M62.81)                Time: 9326-7124 OT Time Calculation (min): 36 min Charges:  OT General Charges $OT Visit: 1 Visit  OT Evaluation $OT Eval Moderate Complexity: 1 Mod OT Treatments $Self Care/Home Management : 8-22 mins  Andrew Warren,  OT Acute Rehab Services Office: [phone] 01/01/2020 Andrew Warren 01/01/2020, 12:53 PM

## 2020-01-01 NOTE — Evaluation (Signed)
Physical Therapy Evaluation Patient Details Name: Andrew Warren MRN: [id number] DOB: 06-Jun-1950 Today's Date: 01/01/2020   History of Present Illness  Andrew Warren is a 69 y.o. male with medical history significant of bipolar disorder, HTN, HLD, and metastatic cancer who presents to the hospital after a clinic visit.   Per oncology note, he was seen by orthopedics outpatient who diagnosed cancer via MRI and he was referred to oncology in early October 10/21.  PET scan on 12/12/19 revealed: Widespread metastatic disease, including to bones, liver,thoracoabdominal nodes, left adrenal gland.  Pt's spouse also reported a recent LT shoulder fracture "from a tumor". Stated that an Orthopedic placed a sling on the pt who could not tolerate it and removed it on the car ride home.  No available LT shoulder imaging at this time, although noted an order.  Clinical Impression   Patient received in bed, very confused and with significant need for increased processing time; spouse present and assisted in providing further information about PLOF/mobility. Able to mobilize to EOB on a Mod-totalA basis with assist x1, tolerated sitting at EOB about 2 minutes before fatiguing and needing to return to supine. Difficult to get him to maintain NWB L UE with mobility due to cognitive status, repeated tactile cues provided. Condom cath leaked with bed mobility today, nursing staff assisted in changing sheets/catheter and aware of mobility status. Left in bed with all needs met, bed alarm active and spouse present. Spouse wanting to get more information about his overall prognosis- currently recommending hospice vs SNF moving forward.     Follow Up Recommendations Other (comment) (hospice at home or hospice house VS SNF)    [place] [facility] bed;Other (comment) (hoyer lift)    Recommendations for Other Services       Precautions / Restrictions Precautions Precautions: Fall;Other  (comment) Precaution Comments: watch HR Restrictions Weight Bearing Restrictions: Yes LUE Weight Bearing: Non weight bearing Other Position/Activity Restrictions: Treating LT shoulder as NWB due to spouse report of recent LT shoulder fracture and orthopedic placing pt in sling recently.  Pt is non-compliant with sling which is not in room.  No WB orders in chart.      Mobility  Bed Mobility Overal bed mobility: Needs Assistance Bed Mobility: Supine to Sit;Rolling;Sit to Supine Rolling: Mod assist   Supine to sit: Total assist Sit to supine: Max assist   General bed mobility comments: increased assistance due to confusion and slow processing, gross weakness; able to tolerate sitting for about 2 minutes before fatiguing; tactile cues to avoid use of L UE    Transfers Overall transfer level: Needs assistance Equipment used: Rolling walker (2 wheeled) Transfers: Sit to/from Stand Sit to Stand: Mod assist         General transfer comment: deferred- fatigue and safety  Ambulation/Gait             General Gait Details: deferred- fatigue and safety  Stairs            Wheelchair Mobility    Modified Rankin (Stroke Patients Only)       Balance Overall balance assessment: Needs assistance Sitting-balance support: Feet supported Sitting balance-Leahy Scale: Poor Sitting balance - Comments: MinA at EOB to maintain upright sitting due to posterior lean, fatigue Postural control: Posterior lean Standing balance support: Bilateral upper extremity supported Standing balance-Leahy Scale: Poor Standing balance comment: Dependent on walker and assistance.             High level balance activites:  Side stepping High Level Balance Comments: Constant cues for sequence, RW and Min As for latearl steps to [facility].             Pertinent Vitals/Pain Pain Assessment: Faces Pain Score: 0-No pain Faces Pain Scale: No hurt Pain Intervention(s): Limited activity within  patient's tolerance;Monitored during session    [place] expects to be discharged to:: Skilled nursing facility Living Arrangements: Spouse/significant other Available Help at Discharge: Family Type of Home: House Home Access: Stairs to enter Entrance Stairs-Rails: None Entrance Stairs-Number of Steps: 6 Home Layout: One level;Other (Comment) (1 step down to a sunken TV room where pt's recliner is located.) Home Equipment: Walker - 4 wheels;Kasandra Knudsen - single point      Prior Function Level of Independence: Needs assistance   Gait / Transfers Assistance Needed: Prior to October 2021, pt was independent with all mobility.  Since ~mid-October, pt has required assistance from spouse with bed mobility, transfers, navigating stairs into house which pt's wife stated feels unsafe for them both, as well as assisance with ADLs.  Wife reports pt has had 2 falls in past 2 weeks.  ADL's / Homemaking Assistance Needed: Need for increasing amounts of assistance since mid-October.  Pt was Independent and driving a semi-trunk prior to October.        Hand Dominance   Dominant Hand: Right (Pt stated Left. Spouse corrected with Right. Pt favoring RT hand.)    Extremity/Trunk Assessment   Upper Extremity Assessment Upper Extremity Assessment: Defer to OT evaluation LUE Deficits / Details: NT proximally due to recent LT shoulder fracture per wife report.  Grip: ~3+/5    Lower Extremity Assessment Lower Extremity Assessment: Generalized weakness    Cervical / Trunk Assessment Cervical / Trunk Assessment: Normal  Communication   Communication: Expressive difficulties (Very delayed responses and mute at times.)  Cognition Arousal/Alertness: Awake/alert Behavior During Therapy: Flat affect Overall Cognitive Status: Impaired/Different from baseline Area of Impairment: Orientation;Attention;Memory;Safety/judgement;Following commands;Awareness;Problem solving                  Orientation Level: Place;Time;Situation Current Attention Level: Focused Memory: Decreased recall of precautions;Decreased short-term memory Following Commands: Follows one step commands inconsistently;Follows one step commands with increased time Safety/Judgement: Decreased awareness of safety;Decreased awareness of deficits Awareness: Intellectual Problem Solving: Slow processing;Decreased initiation;Requires verbal cues;Requires tactile cues;Difficulty sequencing General Comments: sometimes repeats what therapist said, lots of increased time and processing to answer questions/follow cues from therapist      General Comments      Exercises     Assessment/Plan    PT Assessment Patient needs continued PT services  PT Problem List Decreased strength;Decreased cognition;Decreased knowledge of use of DME;Decreased activity tolerance;Decreased safety awareness;Decreased balance;Decreased knowledge of precautions;Decreased mobility;Decreased coordination       PT Treatment Interventions DME instruction;Balance training;Gait training;Cognitive remediation;Stair training;Functional mobility training;Patient/family education;Therapeutic activities;Therapeutic exercise    PT Goals (Current goals can be found in the Care Plan section)  Acute Rehab PT Goals Patient Stated Goal: find more information on prognosis to better determine next steps PT Goal Formulation: With family Time For Goal Achievement: 01/15/20 Potential to Achieve Goals: Fair    Frequency Min 2X/week   Barriers to discharge        Co-evaluation               AM-PAC PT "6 Clicks" Mobility  Outcome Measure Help needed turning from your back to your side while in a flat bed without using bedrails?: A Lot Help  needed moving from lying on your back to sitting on the side of a flat bed without using bedrails?: Total Help needed moving to and from a bed to a chair (including a wheelchair)?: Total Help needed  standing up from a chair using your arms (e.g., wheelchair or bedside chair)?: Total Help needed to walk in hospital room?: Total Help needed climbing 3-5 steps with a railing? : Total 6 Click Score: 7    End of Session   Activity Tolerance: Patient limited by fatigue Patient left: in bed;with call bell/phone within reach;with bed alarm set;with family/visitor present Nurse Communication: Mobility status PT Visit Diagnosis: Muscle weakness (generalized) (M62.81);Difficulty in walking, not elsewhere classified (R26.2);Unsteadiness on feet (R26.81)    Time: 1314-3888 PT Time Calculation (min) (ACUTE ONLY): 32 min   Charges:   PT Evaluation $PT Eval High Complexity: 1 High PT Treatments $Therapeutic Activity: 8-22 mins        Windell Norfolk, DPT, PN1   Supplemental Physical Therapist [place]    Pager [phone] Acute Rehab Office [phone]

## 2020-01-01 NOTE — Care Management Important Message (Signed)
Important Message  Patient Details IM Letter given to the Patient. Name: Andrew Warren MRN: [id number] Date of Birth: [birth year]   Medicare Important Message Given:  Yes     Kerin Salen 01/01/2020, 10:33 AM

## 2020-01-01 NOTE — Progress Notes (Signed)
   01/01/20 1405  Assess: if the MEWS score is Yellow or Red  Were vital signs taken at a resting state? Yes  Focused Assessment Change from prior assessment (see assessment flowsheet)  Early Detection of Sepsis Score *See Row Information* Medium  MEWS guidelines implemented *See Row Information* No, altered LOC is baseline  Treat  MEWS Interventions Escalated (See documentation below)  Pain Scale 0-10  Neuro symptoms relieved by Rest  Take Vital Signs  Increase Vital Sign Frequency  Yellow: Q 2hr X 2 then Q 4hr X 2, if remains yellow, continue Q 4hrs  Escalate  MEWS: Escalate Yellow: discuss with charge nurse/RN and consider discussing with provider and RRT  Notify: Provider  Provider Name/Title Dr. Bonnielee Haff  Date Provider Notified 01/01/20  Time Provider Notified 1405  Notification Type Page  Notification Reason Change in status (Patient hallucinating and heart rate of 136)  Response No new orders  Date of Provider Response 01/01/20  Time of Provider Response 1409  Document  Patient Outcome Other (Comment) (Unchanged)  Progress note created (see row info) Yes  Patient has stated that he is seeing sharks. Dr. Maryland Pink was notified, and no new orders were put out. EKG was done by nursing staff. Prolonged QT interval found. wctm.

## 2020-01-01 NOTE — Progress Notes (Signed)
PROGRESS NOTE    Andrew Warren  GEX:[id number] DOB: [birth year] DOA: 01/09/2020 PCP: [name], MD   No chief complaint on file.  Brief Narrative:  Andrew Warren is a 69 y.o. male with medical history significant of bipolar disorder, HTN, HLD, and metastatic cancer who presents to the hospital after a clinic visit.    Per oncology note, he was seen by orthopedics outpatient who diagnosed cancer via MRI and he was referred to oncology in early October 10/21.  He has not had any definitive diagnosis with biopsy yet.  He's taking morphine for pain in his back, chest and upper abdomen.  He's noticed recent urinary frequency and incontinence over the past 2 days.  He hasn't had a BM in weeks.  He's slower to speak and respond since heing on morphine.  His wife helps him ambulate, use the bathroom over the past week.  Seems like he's been weaker over the apst several weeks.  He notes he's constipated and has issues urinating.  C/o numbness to groin.  He's unable to clearly describe timing.  He complains of pain, but isn't able to describe this well.  No smoking or etoh per his report.   Assessment & Plan:   Saddle Anesthesia/Concern for Cord Compression in setting of metastatic disease:  MRI c spine with scattered osseous metastatic dz involving the cervical spine (see report) MRI with extensive diffuse osseous mets throughout visualized thoracolumbar spine and pelvis.  Early extraosseous extension of tumor at level of the sacrum /S1 segment with tumor encroaching upon the descending S1 nerve roots bilaterally, R > L.  Slight mass effect on the descending R S1 nerve root without overt neural impingement.  No cord or cauda equina compression.  (see report)  thoracic MRI with diffuse osseous metastatic disease MRI brain canceled due to inability to tolerate Per Dr. [name] no compressive lesion in lumbar region or conus.  No surgical intervention was recommended. UA not c/w  UTI.  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 Patient was also seen by radiation oncology.  Plan is for palliative radiation treatment. If the pathology from the biopsies obtained during EGD is unrevealing then may need to do biopsy of another lesion.  Oncology is following. PET 12/12/19 with widespread metastatic dz including to bones, liver, thoracoabdominal nodes, L adrenal gland.  The primary is presumably an area of distal esophageal wall thickening and hypermetabolism given the clinical hx.   Palliative care c/s -> DNR, appreciate palliative recs  Acute Metabolic Encephalopathy Ammonia B12 and TSH levels were normal. Unable to tolerate MRI brain due to pain  Mentation seems to be stable though he remains confused.  Reason for his encephalopathy not entirely clear but thought to be secondary to narcotics.  He does have underlying bipolar disorder.  Diabetes mellitus type 2 SSI. Holding glipizide.  HbA1c 5.9.  Hyperkalemia Patient was given Lokelma with improvement in potassium level.  Hypernatremia Mild.  Noted to be better today.    CKDIIIb Renal function seems to be stable for the most part.  Mild rise in creatinine noted today.  Monitor urine output.  Recheck labs tomorrow.  Hypercalcemia of malignancy Corrected calcium level noted to be 11.7.  Wonder if this  is contributing to his altered mentation even though calcium level is only mildly abnormal.  Will discuss with medical oncology.  He is on IV fluids.  Might consider increasing the rate.  Bipolar disorder On depakote, seroquel at home.  Being continued.  Hyperlipidemia Continue Lipitor  Normocytic anemia No  evidence of overt bleeding.  Hemoglobin is stable.  Essential hypertension/sinus tachycardia Holding lisinopril well-controlled.  Also noted to have mild sinus tachycardia which could be related to weakness mentation.  Does not appear to be in any discomfort or distress.  Continue to monitor for now.  Check EKG.  Not noted to be on any beta-blockers at home.  History of BPH Continue Flomax.    DVT prophylaxis: lovenox Code Status: DNR Family Communication: No family at bedside Disposition: Unclear.  PT and OT evaluation.  Status is: Inpatient  Remains inpatient appropriate because:Inpatient level of care appropriate due to severity of illness   Dispo: The patient is from: Home              Anticipated d/c is to: pending              Anticipated d/c date is: > 3 days              Patient currently is not medically stable to d/c.  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 Biopsied. - Gastritis. Biopsied. - Erythematous duodenopathy. Biopsied. - Normal second portion of the duodenum. Recommendations Soft diet. - Continue present medications. - Await pathology results. - Follow up with Oncology - Use Protonix (pantoprazole) 40 mg PO BID. - Use sucralfate suspension 1 gram PO QID. - Called his wife and discussed endoscopic findings - GI is available if needed, please call with any questions  Antimicrobials:  Anti-infectives (From admission, onward)   None        Subjective: Patient remains pleasantly confused.  Does not appear to be in any pain or discomfort.  Objective: Vitals:   12/31/19 1313 12/31/19 1622 12/31/19 2004 01/01/20 0459  BP: (!) 143/81 103/64 107/73 115/72  Pulse: (!) 111 (!) 107 (!) 103 (!) 110  Resp: 18 15 16 16   Temp: 99 F (37.2 C) 98.2 F (36.8 C) 97.6 F (36.4 C) 98.9 F (37.2 C)  TempSrc: Oral Oral Oral Oral  SpO2: 96% 90% 99% 97%   Weight:      Height:        Intake/Output Summary (Last 24 hours) at 01/01/2020 1015 Last data filed at 01/01/2020 0456 Gross per 24 hour  Intake --  Output 1700 ml  Net -1700 ml   Filed Weights   01/01/2020 1700 12/28/19 0508  Weight: 92.4 kg 93.5 kg    Examination:  General appearance: Awake alert.  In no distress.  Distracted Resp: Clear to auscultation bilaterally.  Normal effort Cardio: S1-S2 is tachycardic regular.  No S3-S4.  No rubs murmurs or bruit GI: Abdomen is soft.  Nontender nondistended.  Bowel sounds are present normal.  No masses organomegaly Extremities: No edema.  Moving all his extremities.  His right foot is noted to be colder compared to the left.  Pulses are not as well palpated in the right foot compared to the left. Neurologic: Remains disoriented.  No focal neurological deficits.      Data Reviewed: I have personally reviewed following labs and imaging studies  CBC: Recent Labs  Lab 12/12/2019 1748 12/17/2019  1748 12/28/19 0555 01/01/2020 0714 12/30/19 0643 12/31/19 0629 01/01/20 0545  WBC 13.1*   < > 12.0* 12.1* 10.2 11.8* 12.3*  NEUTROABS 9.6*  --   --  8.3* 7.1 8.3*  --   HGB 11.9*   < > 11.7* 11.5* 11.1* 11.5* 11.4*  HCT 38.7*   < > 38.5* 37.5* 38.9* 37.9* 38.2*  MCV 88.4   < > 89.7 88.0 94.0 89.2 90.3  PLT 256   < > 224 213 204 199 189   < > = values in this interval not displayed.    Basic Metabolic Panel: Recent Labs  Lab 12/18/2019 1748 12/21/2019 1748 12/28/19 0555 12/12/2019 0714 12/30/19 0643 12/31/19 0629 01/01/20 0545  NA 137   < > 140 145 146* 143 141  K 4.8   < > 5.1 5.1 5.5* 4.4 4.3  CL 101   < > 103 108 110 107 104  CO2 25   < > 26 27 25 26 26   GLUCOSE 92   < > 96 123* 150* 114* 119*  BUN 51*   < > 45* 34* 30* 20 24*  CREATININE 1.89*   < > 1.77* 1.65* 1.82* 1.51* 1.68*  CALCIUM 10.9*   < > 10.5* 10.6* 10.0 10.2 10.6*  MG 2.5*  --   --  2.3 2.4 1.8  --   PHOS 4.4  --   --  3.7 4.6 2.8  --    < > = values in this  interval not displayed.    GFR: Estimated Creatinine Clearance: 46.7 mL/min (A) (by C-G formula based on SCr of 1.68 mg/dL (H)).  Liver Function Tests: Recent Labs  Lab 12/28/19 0555 12/12/2019 0714 12/30/19 0643 12/31/19 0629 01/01/20 0545  AST 22 21 23 19 19   ALT 12 13 11 12 13   ALKPHOS 88 96 88 92 90  BILITOT 0.4 0.3 0.6 0.3 0.4  PROT 6.3* 6.1* 5.7* 5.8* 5.7*  ALBUMIN 2.9* 2.8* 2.6* 2.5* 2.5*    CBG: Recent Labs  Lab 12/31/19 0747 12/31/19 1149 12/31/19 1619 12/31/19 2120 01/01/20 0802  GLUCAP 107* 190* 143* 156* 88     Recent Results (from the past 240 hour(s))  Respiratory Panel by RT PCR (Flu A&B, Covid) - Nasopharyngeal Swab     Status: None   Collection Time: 12/19/2019  5:20 PM   Specimen: Nasopharyngeal Swab  Result Value Ref Range Status   SARS Coronavirus 2 by RT PCR NEGATIVE NEGATIVE Final    Comment: (NOTE) SARS-CoV-2 target nucleic acids are NOT DETECTED.  The SARS-CoV-2 RNA is generally detectable in upper respiratoy specimens during the acute phase of infection. The lowest concentration of SARS-CoV-2 viral copies this assay can detect is 131 copies/mL. A negative result does not preclude SARS-Cov-2 infection and should not be used as the sole basis for treatment or other patient management decisions. A negative result may occur with  improper specimen collection/handling, submission of specimen other than nasopharyngeal swab, presence of viral mutation(s) within the areas targeted by this assay, and inadequate number of viral copies (<131 copies/mL). A negative result must be combined with clinical observations, patient history, and epidemiological information. The expected result is Negative.  Fact Sheet for Patients:  PinkCheek.be  Fact Sheet for Healthcare Providers:  GravelBags.it  This test is no t yet approved or cleared by the [place] FDA and  has been authorized for  detection and/or diagnosis of SARS-CoV-2 by FDA under an Emergency Use Authorization (EUA). This EUA will remain  in effect (meaning this test can be used) for the duration of the COVID-19 declaration under Section 564(b)(1) of the Act, 21 U.S.C. section 360bbb-3(b)(1), unless the authorization is terminated or revoked sooner.     Influenza A by PCR NEGATIVE NEGATIVE Final   Influenza B by PCR NEGATIVE NEGATIVE Final    Comment: (NOTE) The Xpert Xpress SARS-CoV-2/FLU/RSV assay is intended as an aid in  the diagnosis of influenza from Nasopharyngeal swab specimens and  should not be used as a sole basis for treatment. Nasal washings and  aspirates are unacceptable for Xpert Xpress SARS-CoV-2/FLU/RSV  testing.  Fact Sheet for Patients: PinkCheek.be  Fact Sheet for Healthcare Providers: GravelBags.it  This test is not yet approved or cleared by the [place] FDA and  has been authorized for detection and/or diagnosis of SARS-CoV-2 by  FDA under an Emergency Use Authorization (EUA). This EUA will remain  in effect (meaning this test can be used) for the duration of the  Covid-19 declaration under Section 564(b)(1) of the Act, 21  U.S.C. section 360bbb-3(b)(1), unless the authorization is  terminated or revoked. Performed at [facility], [place] [address] [place], [place] [address]   Culture, Urine     Status: Abnormal   Collection Time: 12/12/2019  9:45 PM   Specimen: Urine, Random  Result Value Ref Range Status   Specimen Description   Final    URINE, RANDOM Performed at [place] [address] [place], [place] [address]    Special Requests   Final    NONE Performed at [facility], [place] [address], [place], [place] [address]    Culture (A)  Final    <10,000 COLONIES/mL INSIGNIFICANT GROWTH Performed at [place] [address]  [place],  [address]    Report Status 12/31/2019 FINAL  Final         Radiology Studies: No results found.    Scheduled Meds: . atorvastatin  10 mg Oral q1800  . divalproex  1,500 mg Oral QHS  . enoxaparin (LOVENOX) injection  40 mg Subcutaneous Q24H  . feeding supplement  237 mL Oral BID BM  . insulin aspart  0-9 Units Subcutaneous TID WC  . pantoprazole  40 mg Oral BID  . QUEtiapine  25 mg Oral QHS  . senna  2 tablet Oral BID  . sucralfate  1 g Oral TID WC & HS  . tamsulosin  0.4 mg Oral Daily   Continuous Infusions: . dextrose 5 % and 0.45% NaCl 30 mL/hr at 12/31/19 1228     LOS: 5 days     Bonnielee Haff, MD [facility]   To contact the attending provider between 7A-7P or the covering provider during after hours 7P-7A, please log into the web site [email or web address] and access using universal [place] password for that web site. If you do not have the password, please call the hospital operator.  01/01/2020, 10:15 AM

## 2020-01-02 ENCOUNTER — Ambulatory Visit
Admit: 2020-01-02 | Discharge: 2020-01-02 | Disposition: A | Discharge status: Home / Self Care | Payer: Medicare Other | Attending: Radiation Oncology | Admitting: Radiation Oncology

## 2020-01-02 ENCOUNTER — Encounter: Payer: Self-pay | Admitting: Radiation Oncology

## 2020-01-02 DIAGNOSIS — K2289 Other specified disease of esophagus: Secondary | ICD-10-CM | POA: Diagnosis not present

## 2020-01-02 DIAGNOSIS — Z515 Encounter for palliative care: Secondary | ICD-10-CM | POA: Diagnosis not present

## 2020-01-02 DIAGNOSIS — C7951 Secondary malignant neoplasm of bone: Principal | ICD-10-CM

## 2020-01-02 DIAGNOSIS — C801 Malignant (primary) neoplasm, unspecified: Secondary | ICD-10-CM | POA: Diagnosis not present

## 2020-01-02 DIAGNOSIS — C787 Secondary malignant neoplasm of liver and intrahepatic bile duct: Secondary | ICD-10-CM | POA: Diagnosis not present

## 2020-01-02 DIAGNOSIS — Z7189 Other specified counseling: Secondary | ICD-10-CM | POA: Diagnosis not present

## 2020-01-02 LAB — GLUCOSE, CAPILLARY
Glucose-Capillary: 151 mg/dL — ABNORMAL HIGH (ref 70–99)
Glucose-Capillary: 104 mg/dL — ABNORMAL HIGH (ref 70–99)
Glucose-Capillary: 159 mg/dL — ABNORMAL HIGH (ref 70–99)
Glucose-Capillary: 117 mg/dL — ABNORMAL HIGH (ref 70–99)

## 2020-01-02 LAB — BASIC METABOLIC PANEL
Anion gap: 9 (ref 5–15)
BUN: 26 mg/dL — ABNORMAL HIGH (ref 8–23)
CO2: 29 mmol/L (ref 22–32)
Calcium: 10.8 mg/dL — ABNORMAL HIGH (ref 8.9–10.3)
Chloride: 103 mmol/L (ref 98–111)
Creatinine, Ser: 1.54 mg/dL — ABNORMAL HIGH (ref 0.61–1.24)
GFR, Estimated: 49 mL/min — ABNORMAL LOW (ref >60)
Glucose, Bld: 125 mg/dL — ABNORMAL HIGH (ref 70–99)
Potassium: 4.1 mmol/L (ref 3.5–5.1)
Sodium: 141 mmol/L (ref 135–145)

## 2020-01-02 LAB — SURGICAL PATHOLOGY

## 2020-01-02 LAB — URIC ACID: Uric Acid, Serum: 7 mg/dL (ref 3.7–8.6)

## 2020-01-02 MED ORDER — DEXTROSE-NACL 5-0.45 % IV SOLN: INTRAVENOUS | Status: AC

## 2020-01-02 MED ORDER — LORAZEPAM 2 MG/ML IJ SOLN: 0.5000 mg | Freq: Every day | INTRAMUSCULAR | Status: DC | PRN

## 2020-01-02 MED ORDER — MORPHINE SULFATE 15 MG PO TABS
7.5000 mg | ORAL_TABLET | Freq: Four times a day (QID) | ORAL | Status: DC
  Administered 2020-01-02 – 2020-01-03 (×4): 7.5 mg via ORAL
  Filled 2020-01-02 (×4): qty 1

## 2020-01-02 MED ORDER — MORPHINE SULFATE 15 MG PO TABS
7.5000 mg | ORAL_TABLET | ORAL | Status: DC | PRN
  Administered 2020-01-03 (×2): 7.5 mg via ORAL
  Filled 2020-01-02 (×2): qty 1

## 2020-01-02 NOTE — Progress Notes (Signed)
Patient refused radiation treatment yesterday. Phoned this morning to speak with nurse about patient intentions for radiation today. Nurse secretary explained all staff is in a meeting but, she will have that RN phone me back. Awaiting return call.   Will inquire if patient wants radiation today. Will request premeds if he does.

## 2020-01-02 NOTE — Progress Notes (Signed)
Palliative:  HPI: 69 y.o. male  with past medical history of rheumatoid arthritis, CKD, DM, and bipoloar disorder.  He was diagnosed with cancer in early October but was in the process of an outpatient work up and was unaware of the extent of his cancer.  He was admitted on 01/04/2020 with encephalopathy and urinary incontinence. Imaging shows metastasis thru his entire spine encroaching on the S1 nerve root, as well as mets in the liver and thoracoabdominal nodes.  EGD done 11/19 showed a large distal esophageal ulcerating mass.  Biopsies were taken. Undergoing radiation therapy.   I met today at Andrew Warren's bedside along with his wife, Andrew Warren, and his sister, Andrew Warren (who is a Marine scientist), is on speaker phone. We discussed overall plan and goals moving forward. Plan to continue with radiation. He struggles with anxiety and claustrophobia with radiation therapy. Required haldol to tolerate radiation today but is extremely lethargic now. They report that he has overall had good intake with food and drink. He has previously been awake and alert and interactive with them just as normal over the past days.   Their main concerns are for symptom management. They would like for scheduled morphine to be in place as he is unlikely to ask for medication and is very stoic. He grimaces and becomes fidgety or restless when having pain. Will have prn doses between scheduled doses. They are also concerned about his level of lethargy today with haldol and felt he has tolerated Ativan better in the past. Will order Ativan to be given prior to radiation for tomorrow. They would also like to utilize sliding board for better support and comfort during transfers and activity to radiation therapy and otherwise.   I spoke further with wife, Andrew Warren, who was able to express some of her feelings and concerns. She has clear views of the value of quality of life and suffering for herself but struggles to know if her husband shares the same  values. She does not want him to suffer. She feels his quality of life has continued to decline rapidly. Plans for future are unclear as he is likely a poor candidate for further treatment after palliative radiation but oncology following for recommendations and options.   Family expressed concern with overall poor prognosis and desire for family members to visit with him while he remains cognizant. They have some family that would like to visit but unable to stay all day and they do not want to leave him alone. They also want to ensure that his wife does not miss days in a row of visiting with him due to other family visiting. I spoke with charge RN who has received approval from [facility] for visitors to switch out as long as they maintain one person at a time. I called and left wife, Andrew Warren, voicemail to notify her this was approved.   All questions/concerns addressed to best of my ability. Emotional support provided.   Exam: Sleeping soundly. Pale. Ill-appearing. No distress. HR ST. Breathing regular, unlabored. Abd large.   Plan: - Metastatic cancer pain:   - Morphine 7.5 mg every 6 hours scheduled.  - Morphine 7.5 mg every 3 hours as needed between scheduled doses.   - May consider addition of decadron, bisphosphonates, scheduled Tylneol 1000 mg TID.  - Anxiety: Trial Ativan 0.5 mg IV prior to radiation treatment.   [place], NP Palliative Medicine Team Pager [phone] (Please see [email or web address] for schedule) Team Phone [phone]    Greater  than 50%  of this time was spent counseling and coordinating care related to the above assessment and plan

## 2020-01-02 NOTE — Progress Notes (Signed)
SLP DC Note  Patient Details Name: Andrew Warren MRN: [id number] DOB: 06-Jun-1950  :       Reason Eval/Treat Not Completed: Other (comment) (per chart, pt is at radiation therapy, per notes - his intake is 100% with good tolerance, SLP will sign off at this time)  Pt being followed by palliative for esophageal cancer.    Kathleen Lime, MS [facility] SLP Acute Rehab Services Office [phone] Pager [phone]   Macario Golds 01/02/2020, 7:15 PM

## 2020-01-02 NOTE — Progress Notes (Signed)
Per AC. Ok for patients family to switch in and out to visit patient. Vinie Sill, NP notified.

## 2020-01-02 NOTE — Progress Notes (Signed)
PT Cancellation Note  Patient Details Name: Andrew Warren MRN: [id number] DOB: May 20, 1950   Cancelled Treatment:    Reason Eval/Treat Not Completed: Patient declined, no reason specified (pt confused. Oriented to self only. When asked to attempt supine to sit, pt stated, "stop bugging me! leave me alone!") Will follow.   Blondell Reveal Kistler PT 01/02/2020  Acute Rehabilitation Services Pager [phone] Office [phone]

## 2020-01-02 NOTE — Progress Notes (Signed)
PROGRESS NOTE    [name]  AOZ:[id number] DOB: 10-25-1950 DOA: 12/23/2019 PCP: [name], MD   No chief complaint on file.  Brief Narrative:  Andrew Warren is a 69 y.o. male with medical history significant of bipolar disorder, HTN, HLD, and metastatic cancer who presents to the hospital after a clinic visit.    Per oncology note, he was seen by orthopedics outpatient who diagnosed cancer via MRI and he was referred to oncology in early October 10/21.  He has not had any definitive diagnosis with biopsy yet.  He's taking morphine for pain in his back, chest and upper abdomen.  He's noticed recent urinary frequency and incontinence over the past 2 days.  He hasn't had a BM in weeks.  He's slower to speak and respond since heing on morphine.  His wife helps him ambulate, use the bathroom over the past week.  Seems like he's been weaker over the apst several weeks.  He notes he's constipated and has issues urinating.  C/o numbness to groin.  He's unable to clearly describe timing.  He complains of pain, but isn't able to describe this well.  No smoking or etoh per his report.   Patient underwent MRI of his spine.  Also underwent EGD with biopsy.  Oncology has been following.  Plan is for radiation treatments.  Disposition is unclear at this time.  Assessment & Plan:   Saddle Anesthesia/Concern for Cord Compression in setting of metastatic disease:  MRI c spine with scattered osseous metastatic dz involving the cervical spine (see report) MRI with extensive diffuse osseous mets throughout visualized thoracolumbar spine and pelvis.  Early extraosseous extension of tumor at level of the sacrum /S1 segment with tumor encroaching upon the descending S1 nerve roots bilaterally, R > L.  Slight mass effect on the descending R S1 nerve root without overt neural impingement.  No cord or cauda equina compression.  (see report)  thoracic MRI with diffuse osseous metastatic disease MRI  brain canceled due to inability to tolerate Per Dr. [name] no compressive lesion in lumbar region or conus.  No surgical intervention was recommended. UA not c/w UTI. Seems to be stable from a neurological standpoint.  Poorly differentiated adenocarcinoma with metastases Patient seen by oncology.   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  Plan is for palliative radiation treatment. PET 12/12/19 with widespread metastatic dz including to bones, liver, thoracoabdominal nodes, L adrenal gland.   Palliative care c/s -> DNR, appreciate palliative recs Esophageal mass biopsy came back as poorly differentiated adenocarcinoma.  Oncology has discussed with patient and family.  Plan is to continue with palliative radiation.  Further testing has been ordered to see if he is a candidate for immunotherapy or targeted therapy.  Patient not a candidate for intensive chemotherapy. May also be reasonable to consider palliative care along with hospice.  All these issues were discussed with family by oncology. Palliative care has been following.  Acute Metabolic Encephalopathy Ammonia B12 and TSH levels were normal. Unable to tolerate MRI brain due to pain  Mentation seems to be stable though he remains confused.  Reason for his encephalopathy not entirely clear but thought to be secondary to narcotics.  He does have underlying bipolar disorder.  Diabetes mellitus type 2 SSI. Holding glipizide.  HbA1c 5.9.  Hyperkalemia Patient was given [facility] with improvement  in potassium level.  Potassium level noted to be stable today.  Hypernatremia Seems to have resolved.  CKDIIIb Renal function stable for the most part.  Mild rise in creatinine was noted yesterday.  Improved today after IV fluid rate was increased.  Continue to monitor urine output.      Hypercalcemia of malignancy Medical oncology addressing this issue.  May need to consider pamidronate if calcium levels continue to rise.  He remains on IV fluids.    Bipolar disorder On depakote, seroquel at home.  Being continued.  Hyperlipidemia Continue Lipitor  Normocytic anemia No evidence of overt bleeding.  Hemoglobin is stable.  Essential hypertension/sinus tachycardia Holding lisinopril well-controlled.  Also noted to have mild sinus tachycardia which could be related to his agitation.  Unable to determine if patient has any pain or discomfort.  TSH was normal.  EKG still pending.  Heart rate seems to be better today.   Not noted to be on any beta-blockers at home.  History of BPH Continue Flomax.    Possible peripheral artery disease Noted to have colder right foot compared to the left.  Has good capillary refill.  Poor pulsations on the right.  Likely has PAD.  DVT prophylaxis: lovenox Code Status: DNR Family Communication: No family at bedside Disposition: SNF was recommended by PT and OT.  Transition of care to follow.  Disposition will also depend on what family wants to do in terms of cancer treatment vis--vis palliative care.    Status is: Inpatient  Remains inpatient appropriate because:Inpatient level of care appropriate due to severity of illness   Dispo: The patient is from: Home              Anticipated d/c is to: pending              Anticipated d/c date is: > 3 days              Patient currently is not medically stable to d/c.  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 Biopsied. - Gastritis. Biopsied. - Erythematous duodenopathy. Biopsied. - Normal second portion of the duodenum. Recommendations Soft diet. - Continue present medications. - Await pathology results. - Follow up with Oncology - Use Protonix (pantoprazole) 40 mg PO BID. - Use  sucralfate suspension 1 gram PO QID. - Called his wife and discussed endoscopic findings - GI is available if needed, please call with any questions  Antimicrobials:  Anti-infectives (From admission, onward)   None        Subjective: Patient remains pleasantly confused.  Does not have any pain at this time.  Remains distracted.  Objective: Vitals:   01/01/20 1815 01/01/20 2144 01/02/20 0121 01/02/20 0537  BP: 129/74 115/72 128/81 132/77  Pulse: (!) 122 (!) 110 (!) 110 (!) 110  Resp: 19 16  16   Temp: 98.9 F (37.2 C) 99 F (37.2 C) 98.9 F (37.2 C) 98.3 F (36.8 C)  TempSrc: Oral Oral Axillary Oral  SpO2:  98%  99%  Weight:      Height:        Intake/Output Summary (Last 24 hours) at 01/02/2020 1033 Last data filed at 01/02/2020 0559 Gross per 24 hour  Intake 1100 ml  Output 2925 ml  Net -1825 ml   Filed Weights   12/15/2019 1700 12/28/19 0508  Weight: 92.4 kg 93.5 kg    Examination:  General appearance:  Awake alert.  In no distress.  Distracted Resp: Clear to auscultation bilaterally.  Normal effort Cardio: S1-S2 is normal regular.  No S3-S4.  No rubs murmurs or bruit GI: Abdomen is soft.  Noted to be mildly tender in the right upper quadrant without any rebound rigidity or guarding.  No masses organomegaly.   Extremities: No edema.  Right leg is noted to be colder compared to the left.  Pulses are also not as well palpated in the right foot. Neurologic: Remains disoriented.  No focal neurological deficits.      Data Reviewed: I have personally reviewed following labs and imaging studies  CBC: Recent Labs  Lab 12/26/2019 1748 12/12/2019 1748 12/28/19 0555 12/21/2019 0714 12/30/19 0643 12/31/19 0629 01/01/20 0545  WBC 13.1*   < > 12.0* 12.1* 10.2 11.8* 12.3*  NEUTROABS 9.6*  --   --  8.3* 7.1 8.3*  --   HGB 11.9*   < > 11.7* 11.5* 11.1* 11.5* 11.4*  HCT 38.7*   < > 38.5* 37.5* 38.9* 37.9* 38.2*  MCV 88.4   < > 89.7 88.0 94.0 89.2 90.3  PLT 256   < > 224  213 204 199 189   < > = values in this interval not displayed.    Basic Metabolic Panel: Recent Labs  Lab 12/13/2019 1748 12/28/19 0555 12/15/2019 0714 12/30/19 0643 12/31/19 0629 01/01/20 0545 01/02/20 0557  NA 137   < > 145 146* 143 141 141  K 4.8   < > 5.1 5.5* 4.4 4.3 4.1  CL 101   < > 108 110 107 104 103  CO2 25   < > 27 25 26 26 29   GLUCOSE 92   < > 123* 150* 114* 119* 125*  BUN 51*   < > 34* 30* 20 24* 26*  CREATININE 1.89*   < > 1.65* 1.82* 1.51* 1.68* 1.54*  CALCIUM 10.9*   < > 10.6* 10.0 10.2 10.6* 10.8*  MG 2.5*  --  2.3 2.4 1.8  --   --   PHOS 4.4  --  3.7 4.6 2.8  --   --    < > = values in this interval not displayed.    GFR: Estimated Creatinine Clearance: 50.9 mL/min (A) (by C-G formula based on SCr of 1.54 mg/dL (H)).  Liver Function Tests: Recent Labs  Lab 12/28/19 0555 12/14/2019 0714 12/30/19 0643 12/31/19 0629 01/01/20 0545  AST 22 21 23 19 19   ALT 12 13 11 12 13   ALKPHOS 88 96 88 92 90  BILITOT 0.4 0.3 0.6 0.3 0.4  PROT 6.3* 6.1* 5.7* 5.8* 5.7*  ALBUMIN 2.9* 2.8* 2.6* 2.5* 2.5*    CBG: Recent Labs  Lab 01/01/20 0802 01/01/20 1140 01/01/20 1633 01/01/20 2153 01/02/20 0726  GLUCAP 88 113* 154* 124* 151*     Recent Results (from the past 240 hour(s))  Respiratory Panel by RT PCR (Flu A&B, Covid) - Nasopharyngeal Swab     Status: None   Collection Time: 12/20/2019  5:20 PM   Specimen: Nasopharyngeal Swab  Result Value Ref Range Status   SARS Coronavirus 2 by RT PCR NEGATIVE NEGATIVE Final    Comment: (NOTE) SARS-CoV-2 target nucleic acids are NOT DETECTED.  The SARS-CoV-2 RNA is generally detectable in upper respiratoy specimens during the acute phase of infection. The lowest concentration of SARS-CoV-2 viral copies this assay can detect is 131 copies/mL. A negative result does not preclude SARS-Cov-2 infection and should not be used as the sole basis  for treatment or other patient management decisions. A negative result may occur with   improper specimen collection/handling, submission of specimen other than nasopharyngeal swab, presence of viral mutation(s) within the areas targeted by this assay, and inadequate number of viral copies (<131 copies/mL). A negative result must be combined with clinical observations, patient history, and epidemiological information. The expected result is Negative.  Fact Sheet for Patients:  PinkCheek.be  Fact Sheet for Healthcare Providers:  GravelBags.it  This test is no t yet approved or cleared by the [place] FDA and  has been authorized for detection and/or diagnosis of SARS-CoV-2 by FDA under an Emergency Use Authorization (EUA). This EUA will remain  in effect (meaning this test can be used) for the duration of the COVID-19 declaration under Section 564(b)(1) of the Act, 21 U.S.C. section 360bbb-3(b)(1), unless the authorization is terminated or revoked sooner.     Influenza A by PCR NEGATIVE NEGATIVE Final   Influenza B by PCR NEGATIVE NEGATIVE Final    Comment: (NOTE) The Xpert Xpress SARS-CoV-2/FLU/RSV assay is intended as an aid in  the diagnosis of influenza from Nasopharyngeal swab specimens and  should not be used as a sole basis for treatment. Nasal washings and  aspirates are unacceptable for Xpert Xpress SARS-CoV-2/FLU/RSV  testing.  Fact Sheet for Patients: PinkCheek.be  Fact Sheet for Healthcare Providers: GravelBags.it  This test is not yet approved or cleared by the [place] FDA and  has been authorized for detection and/or diagnosis of SARS-CoV-2 by  FDA under an Emergency Use Authorization (EUA). This EUA will remain  in effect (meaning this test can be used) for the duration of the  Covid-19 declaration under Section 564(b)(1) of the Act, 21  U.S.C. section 360bbb-3(b)(1), unless the authorization is  terminated or  revoked. Performed at [facility], [place] [address], [place], [place] [address]   Culture, Urine     Status: Abnormal   Collection Time: 12/26/2019  9:45 PM   Specimen: Urine, Random  Result Value Ref Range Status   Specimen Description   Final    URINE, RANDOM Performed at [facility] [address], [place], [place] [address]    Special Requests   Final    NONE Performed at [facility], [place] [address] [place], [place] [address]    Culture (A)  Final    <10,000 COLONIES/mL INSIGNIFICANT GROWTH Performed at [place] [address] [place], [place] [address]    Report Status 12/31/2019 FINAL  Final         Radiology Studies: No results found.    Scheduled Meds: . atorvastatin  10 mg Oral q1800  . divalproex  1,500 mg Oral QHS  . enoxaparin (LOVENOX) injection  40 mg Subcutaneous Q24H  . feeding supplement  237 mL Oral BID BM  . insulin aspart  0-9 Units Subcutaneous TID WC  . pantoprazole  40 mg Oral BID  . QUEtiapine  25 mg Oral QHS  . senna  2 tablet Oral BID  . sucralfate  1 g Oral TID WC & HS  . tamsulosin  0.4 mg Oral Daily   Continuous Infusions:    LOS: 6 days     Andrew Haff, MD [facility]   To contact the attending provider between 7A-7P or the covering provider during after hours 7P-7A, please log into the web site [email or web address] and access using universal [place] password for that web site. If you do not  have the password, please call the hospital operator.  01/02/2020, 10:33 AM

## 2020-01-02 NOTE — Progress Notes (Signed)
[facility] Therapy Treatment Record Phone [phone]   Radiation Therapy was administered to Andrew Warren on: 01/02/2020  11:11 AM and was treatment # 1 out of a planned course of 10 treatments.  Radiation Treatment  1). Beam photons with 6-10 energy  2). Brachytherapy None  3). Stereotactic Radiosurgery None  4). Other Radiation None     Phung Kotas A Kolbie [place], RT (T)

## 2020-01-03 ENCOUNTER — Ambulatory Visit
Admit: 2020-01-03 | Discharge: 2020-01-03 | Disposition: A | Discharge status: Home / Self Care | Payer: Medicare Other | Attending: Radiation Oncology | Admitting: Radiation Oncology

## 2020-01-03 DIAGNOSIS — R948 Abnormal results of function studies of other organs and systems: Secondary | ICD-10-CM | POA: Diagnosis not present

## 2020-01-03 DIAGNOSIS — C7951 Secondary malignant neoplasm of bone: Secondary | ICD-10-CM | POA: Diagnosis not present

## 2020-01-03 DIAGNOSIS — K2289 Other specified disease of esophagus: Secondary | ICD-10-CM | POA: Diagnosis not present

## 2020-01-03 DIAGNOSIS — C787 Secondary malignant neoplasm of liver and intrahepatic bile duct: Secondary | ICD-10-CM | POA: Diagnosis not present

## 2020-01-03 DIAGNOSIS — Z7189 Other specified counseling: Secondary | ICD-10-CM | POA: Diagnosis not present

## 2020-01-03 DIAGNOSIS — Z515 Encounter for palliative care: Secondary | ICD-10-CM | POA: Diagnosis not present

## 2020-01-03 LAB — GLUCOSE, CAPILLARY
Glucose-Capillary: 121 mg/dL — ABNORMAL HIGH (ref 70–99)
Glucose-Capillary: 146 mg/dL — ABNORMAL HIGH (ref 70–99)
Glucose-Capillary: 114 mg/dL — ABNORMAL HIGH (ref 70–99)
Glucose-Capillary: 107 mg/dL — ABNORMAL HIGH (ref 70–99)

## 2020-01-03 LAB — COMPREHENSIVE METABOLIC PANEL
ALT: 19 U/L (ref 0–44)
AST: 22 U/L (ref 15–41)
Albumin: 2.5 g/dL — ABNORMAL LOW (ref 3.5–5.0)
Alkaline Phosphatase: 91 U/L (ref 38–126)
Anion gap: 10 (ref 5–15)
BUN: 29 mg/dL — ABNORMAL HIGH (ref 8–23)
CO2: 27 mmol/L (ref 22–32)
Calcium: 10.9 mg/dL — ABNORMAL HIGH (ref 8.9–10.3)
Chloride: 104 mmol/L (ref 98–111)
Creatinine, Ser: 1.6 mg/dL — ABNORMAL HIGH (ref 0.61–1.24)
GFR, Estimated: 47 mL/min — ABNORMAL LOW (ref >60)
Glucose, Bld: 122 mg/dL — ABNORMAL HIGH (ref 70–99)
Potassium: 4.1 mmol/L (ref 3.5–5.1)
Sodium: 141 mmol/L (ref 135–145)
Total Bilirubin: 0.3 mg/dL (ref 0.3–1.2)
Total Protein: 5.9 g/dL — ABNORMAL LOW (ref 6.5–8.1)

## 2020-01-03 LAB — CBC
HCT: 37.1 % — ABNORMAL LOW (ref 39.0–52.0)
Hemoglobin: 11.4 g/dL — ABNORMAL LOW (ref 13.0–17.0)
MCH: 27.5 pg (ref 26.0–34.0)
MCHC: 30.7 g/dL (ref 30.0–36.0)
MCV: 89.4 fL (ref 80.0–100.0)
Platelets: 242 10*3/uL (ref 150–400)
RBC: 4.15 MIL/uL — ABNORMAL LOW (ref 4.22–5.81)
RDW: 14.3 % (ref 11.5–15.5)
WBC: 9.5 10*3/uL (ref 4.0–10.5)
nRBC: 0 % (ref 0.0–0.2)

## 2020-01-03 MED ORDER — LORAZEPAM 2 MG/ML IJ SOLN
1.0000 mg | Freq: Every day | INTRAMUSCULAR | Status: DC | PRN
  Administered 2020-01-03: 1 mg via INTRAVENOUS
  Filled 2020-01-03: qty 1

## 2020-01-03 MED ORDER — HALOPERIDOL LACTATE 5 MG/ML IJ SOLN
1.0000 mg | Freq: Four times a day (QID) | INTRAMUSCULAR | Status: DC | PRN
  Administered 2020-01-06 – 2020-01-07 (×2): 1 mg via INTRAVENOUS
  Filled 2020-01-03 (×3): qty 1

## 2020-01-03 MED ORDER — MORPHINE SULFATE 15 MG PO TABS
7.5000 mg | ORAL_TABLET | ORAL | Status: DC | PRN
  Administered 2020-01-03 – 2020-01-06 (×6): 7.5 mg via ORAL
  Filled 2020-01-03 (×6): qty 1

## 2020-01-03 MED ORDER — ACETAMINOPHEN 160 MG/5ML PO SOLN
1000.0000 mg | Freq: Three times a day (TID) | ORAL | Status: DC
  Administered 2020-01-03 – 2020-01-07 (×10): 1000 mg via ORAL
  Filled 2020-01-03 (×10): qty 40.6

## 2020-01-03 MED ORDER — MORPHINE SULFATE ER 15 MG PO TBCR
15.0000 mg | EXTENDED_RELEASE_TABLET | Freq: Two times a day (BID) | ORAL | Status: DC
  Administered 2020-01-03: 15 mg via ORAL
  Administered 2020-01-04: 7.5 mg via ORAL
  Administered 2020-01-04 – 2020-01-07 (×7): 15 mg via ORAL
  Filled 2020-01-03 (×9): qty 1

## 2020-01-03 NOTE — Progress Notes (Signed)
Notified by treatment therapist, Threasa Beards, that they are unable to deliver the patient's treatment today because he won't remain still on the treatment table. Phoned Debbe Mounts, RN caring for patient on [place]. Inquired if something additional could be given to relax this patient for treatment. She states, "I have exhausted all the hospitalist wanted him to have." She reports that yesterday the patient received haldol prior to treatment but today morphine and ativan were only available. Relayed findings to Dr. Jene Every.

## 2020-01-03 NOTE — Progress Notes (Signed)
Palliative:  HPI: 69 y.o.malewith past medical history of rheumatoid arthritis, CKD, DM, and bipoloar disorder. He was diagnosed with cancer in early October but was in the process of an outpatient work up and was unaware of the extent of his cancer. He was admitted on11/17/2021with encephalopathy and urinary incontinence.Imaging shows metastasis thru his entire spine encroaching on the S1 nerve root, as well as mets in the liver and thoracoabdominal nodes. EGD done 11/19 showed a large distal esophageal ulcerating mass. Biopsies were taken. Undergoing radiation therapy.  I met today at [name]'s bedside. He awakens to try and speak with me but often falls asleep before being able answer me. He was restless and had extra pain medication shortly before my visit. Wife, Andrew Warren, at bedside. We seem to be struggling to find a happy place for good pain control vs lethargy. We discussed plan for long acting pain medication to see if this will be better for him and wife agrees. Will also add scheduled Tylenol. Will see how he does in radiation today with Ativan as well as extra morphine dose. He did seem to do well with morphine and haldol yesterday but was very sleepy after. Wife's presence also helps.  Discussed with wife his overall status. He is eating but continues with some fluctuating mental status. Worry about hospital delirium combined with underlying bipolar disorder. I do worry about how he will do overall and wife is also concerned. She hates seeing him suffer. Will make adjustments to medications and continue to assess needs daily.   All questions/concerns addressed. Emotional support provided.   Exam: Lethargic. Fluctuating mental status. Struggling to stay awake. No distress. HR tachycardic. Breathing regular, unlabored. Restless earlier. Moving all extremities. Warm to touch.   Plan: - Bone mets pain:   - MS contin 15 mg every 12 hours.   - MSIR 7.5 mg every 4 hours prn.   -  Tylenol 1000 mg TID.   - May consider decadron although with his anxiety consider carefully. May also consider bisphosphonates.  - Please give Ativan 1 mg IV prior to radiation therapy. If this is ineffective haldol 1 mg IV seemed to work yesterday.  - Use slide board and move carefully.  - Continue PT/OT. Noted that he stood and took a few steps with assistance today.   [place], NP Palliative Medicine Team Pager [phone] (Please see [email or web address] for schedule) Team Phone [phone]    Greater than 50%  of this time was spent counseling and coordinating care related to the above assessment and plan

## 2020-01-03 NOTE — Progress Notes (Signed)
Nutrition Follow-up RD working remotely.  DOCUMENTATION CODES:   Obesity unspecified  INTERVENTION:  - continue Ensure Enlive BID. - will complete NFPE at follow-up.  NUTRITION DIAGNOSIS:   Increased nutrient needs related to cancer and cancer related treatments as evidenced by estimated needs. -ongoing  GOAL:   Patient will meet greater than or equal to 90% of their needs -minimally met on average   MONITOR:   PO intake, Supplement acceptance, Labs, Weight trends, I & O's  ASSESSMENT:   69 y.o. male with medical history significant of bipolar disorder, HTN, HLD, and metastatic cancer who presents to the hospital after a clinic visit.  He has been eating 75-100% at all meals over the past 3 days and accepting Ensure 75-90% of the time offered.  He has not been weighed since 11/18 and weight on that date was +2 lb compared to weight on 11/17. No information documented in the edema section of flow sheet.   Patient currently noted to be out of room to radiation.   Palliative Care is following. He is DNR.    Labs reviewed; CBGs: 121 and 146 mg/dl, BUN: 29 mg/dl, creatinine: 1.6 mg/dl, Ca: 10.9 mg/dl, GFR: 47 ml/min.  Medications reviewed; sliding scale novolog, 40 mg oral protonix BID, 2 tablets senokot BID, 1 g carafate TID.     NUTRITION - FOCUSED PHYSICAL EXAM:  unable to complete at this time.  Diet Order:   Diet Order            Diet regular Room service appropriate? Yes; Fluid consistency: Thin  Diet effective now                 EDUCATION NEEDS:   No education needs have been identified at this time  Skin:  Skin Assessment: Reviewed RN Assessment  Last BM:  11/21  Height:   Ht Readings from Last 1 Encounters:  12/22/2019 5' 8"  (1.727 m)    Weight:   Wt Readings from Last 1 Encounters:  12/28/19 93.5 kg     Estimated Nutritional Needs:  Kcal:  2100-2300 Protein:  100-115g Fluid:  2L/day      Jarome Matin, MS, RD, LDN,  CNSC Inpatient Clinical Dietitian RD pager # available in AMION  After hours/weekend pager # available in [facility]

## 2020-01-03 NOTE — Progress Notes (Signed)
Occupational Therapy Treatment Patient Details Name: Andrew Warren MRN: [id number] DOB: [birth year] Today's Date: 01/03/2020    History of present illness Andrew Warren is a 69 y.o. male with medical history significant of bipolar disorder, HTN, HLD, and metastatic cancer who presents to the hospital after a clinic visit.   Per oncology note, he was seen by orthopedics outpatient who diagnosed cancer via MRI and he was referred to oncology in early October 10/21.  PET scan on 12/12/19 revealed: Widespread metastatic disease, including to bones, liver,thoracoabdominal nodes, left adrenal gland.  Pt's spouse also reported a recent LT shoulder fracture "from a tumor". Stated that an Orthopedic placed a sling on the pt who could not tolerate it and removed it on the car ride home.  No available LT shoulder imaging at this time, although noted an order.   OT comments  Patient and spouse reporting patient wanting to walk. Requires min A for LE and total A for trunk support to sit upright at EOB. Patient highly impulsive attempting to stand multiple times from EOB before OT ready, requiring max cues to redirect with spouse assisting to keep patient at EOB. Patient mod A to power up to standing with RW, progress to min A to take steps forward/back approx 54ft. Patient fatigues with this and wants to lay back down requiring max A. Continue to recommend 24/7 assist and supervision at discharge due to decreased activity tolerance, strength, balance, and safety awareness.    Follow Up Recommendations  SNF;Supervision/Assistance - 24 hour    Equipment Recommendations  Hospital bed;3 in 1 bedside commode;Tub/shower seat;Toilet rise with handles;Wheelchair (measurements OT)       Precautions / Restrictions Precautions Precautions: Fall;Other (comment) Precaution Comments: watch HR       Mobility Bed Mobility Overal bed mobility: Needs Assistance Bed Mobility: Supine to Sit;Sit to Supine      Supine to sit: Max assist;HOB elevated Sit to supine: Max assist   General bed mobility comments: with increased time patient does initiate mobilizing LEs to EOB, total A for trunk support to sit upright. max A to lift LEs and guide trunk to supine   Transfers Overall transfer level: Needs assistance Equipment used: Rolling walker (2 wheeled) Transfers: Sit to/from Stand Sit to Stand: Mod assist;From elevated surface         General transfer comment: poor safety awareness pulling from walker and attempting to stand multiple times before OT ready. Mod A to power up and stabilize in standing, progress to min A for stabilization with taking few steps forward/back    Balance Overall balance assessment: Needs assistance Sitting-balance support: Feet supported Sitting balance-Leahy Scale: Fair Sitting balance - Comments: close S due to impulsivity   Standing balance support: Bilateral upper extremity supported Standing balance-Leahy Scale: Poor Standing balance comment: Dependent on walker and assistance.                           ADL either performed or assessed with clinical judgement   ADL Overall ADL's : Needs assistance/impaired Eating/Feeding: Total assistance Eating/Feeding Details (indicate cue type and reason): patient not initiating eating his lunch, spouse feeding patient                 Lower Body Dressing: Total assistance;Bed level Lower Body Dressing Details (indicate cue type and reason): to don clean socks Toilet Transfer: Moderate assistance;RW Toilet Transfer Details (indicate cue type and reason): see mobility section  Functional mobility during ADLs: Moderate assistance;Rolling walker;Cueing for safety;Cueing for sequencing General ADL Comments: patient is impulsive requiring multiple cues to redirect not to stand until OT ready and AD in place               Cognition Arousal/Alertness: Awake/alert Behavior During Therapy:  Flat affect;Impulsive Overall Cognitive Status: Impaired/Different from baseline Area of Impairment: Following commands;Safety/judgement;Problem solving                       Following Commands: Follows one step commands inconsistently;Follows one step commands with increased time Safety/Judgement: Decreased awareness of safety;Decreased awareness of deficits   Problem Solving: Requires verbal cues                     Pertinent Vitals/ Pain       Pain Assessment: Faces Faces Pain Scale: Hurts even more Pain Location: back Pain Descriptors / Indicators: Grimacing Pain Intervention(s): Monitored during session         Frequency  Min 2X/week        Progress Toward Goals  OT Goals(current goals can now be found in the care plan section)  Progress towards OT goals: Progressing toward goals  Acute Rehab OT Goals Patient Stated Goal: to walk OT Goal Formulation: With patient/family ADL Goals Pt Will Perform Grooming: with supervision;sitting Pt Will Perform Upper Body Bathing: sitting;with supervision;with set-up Pt Will Perform Lower Body Bathing: with min assist;sitting/lateral leans;sit to/from stand Pt Will Perform Upper Body Dressing: with min assist;sitting Pt Will Perform Lower Body Dressing: with min assist;sit to/from stand;sitting/lateral leans;bed level Pt Will Transfer to Toilet: with supervision;bedside commode  Plan Discharge plan remains appropriate       AM-PAC OT "6 Clicks" Daily Activity     Outcome Measure   Help from another person eating meals?: Total Help from another person taking care of personal grooming?: A Lot Help from another person toileting, which includes using toliet, bedpan, or urinal?: Total Help from another person bathing (including washing, rinsing, drying)?: A Lot Help from another person to put on and taking off regular upper body clothing?: A Lot Help from another person to put on and taking off regular lower body  clothing?: Total 6 Click Score: 9    End of Session Equipment Utilized During Treatment: Gait belt;Rolling walker  OT Visit Diagnosis: Unsteadiness on feet (R26.81);Other symptoms and signs involving cognitive function;History of falling (Z91.81);Repeated falls (R29.6);Muscle weakness (generalized) (M62.81)   Activity Tolerance Patient limited by fatigue   Patient Left in bed;with call bell/phone within reach;with family/visitor present   Nurse Communication Mobility status        Time: 3382-5053 OT Time Calculation (min): 18 min  Charges: OT General Charges $OT Visit: 1 Visit OT Treatments $Self Care/Home Management : 8-22 mins  Delbert Phenix OT OT pager: [place] 01/03/2020, 1:54 PM

## 2020-01-03 NOTE — Progress Notes (Signed)
[facility] Therapy Treatment Record Phone [phone]   Radiation Therapy was administered to Andrew Warren on: 01/03/2020  3:14 PM and was treatment # 2 out of Warren planned course of 10 treatments.  Radiation Treatment  1). Beam photons with 6-10 energy  2). Brachytherapy None  3). Stereotactic Radiosurgery None  4). Other Radiation None     Andrew Warren Andrew Warren, RT (T)

## 2020-01-03 NOTE — Progress Notes (Signed)
PROGRESS NOTE    Andrew Warren  NID:[id number] DOB: 1950/04/18 DOA: 01/07/2020 PCP: [name], MD   No chief complaint on file.  Brief Narrative:  Andrew Warren is a 69 y.o. male with medical history significant of bipolar disorder, HTN, HLD, and metastatic cancer who presents to the hospital after a clinic visit. Per oncology note, he was seen by orthopedics outpatient who diagnosed cancer via MRI and he was referred to oncology in early October 10/21.  He has not had any definitive diagnosis with biopsy yet.  He's taking morphine for pain in his back, chest and upper abdomen.  He's noticed recent urinary frequency and incontinence over the past 2 days.  He hasn't had a BM in weeks.  He's slower to speak and respond since heing on morphine.  His wife helps him ambulate, use the bathroom over the past week.  Seems like he's been weaker over the apst several weeks.  He notes he's constipated and has issues urinating.  C/o numbness to groin.  He's unable to clearly describe timing.  He complains of pain, but isn't able to describe this well.  No smoking or etoh per his report.    Patient underwent MRI of his spine.  Also underwent EGD with biopsy.  Oncology has been following.  Plan is for radiation treatments.  Disposition is unclear at this time.  Assessment & Plan:   Saddle Anesthesia/Concern for Cord Compression in setting of metastatic disease:  MRI c spine with scattered osseous metastatic dz involving the cervical spine (see report) MRI with extensive diffuse osseous mets throughout visualized thoracolumbar spine and pelvis.  Early extraosseous extension of tumor at level of the sacrum /S1 segment with tumor encroaching upon the descending S1 nerve roots bilaterally, R > L.  Slight mass effect on the descending R S1 nerve root without overt neural impingement.  No cord or cauda equina compression.  (see report)  thoracic MRI with diffuse osseous metastatic disease MRI brain  canceled due to inability to tolerate  Per Dr. [name] no compressive lesion in lumbar region or conus.  No surgical intervention was recommended. UA not c/w UTI. Seems to be stable from a neurological standpoint.  Poorly differentiated adenocarcinoma with metastases Patient seen by oncology.   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  Plan is for palliative radiation treatment. PET 12/12/19 with widespread metastatic dz including to bones, liver, thoracoabdominal nodes, L adrenal gland.   Palliative care c/s -> DNR, appreciate palliative recs Esophageal mass biopsy came back as poorly differentiated adenocarcinoma.  Oncology has discussed with patient and family.  Plan is to continue with palliative radiation.  Further testing has been ordered to see if he is a candidate for immunotherapy or targeted therapy.  Patient not a candidate for intensive chemotherapy. May also be reasonable to consider palliative care along with hospice.  All these issues were discussed with family by oncology. Palliative care has been following.  Acute Metabolic Encephalopathy Ammonia B12 and TSH levels were normal. Unable to tolerate MRI brain due to pain  Mentation seems to be stable though he remains confused.  Reason for his encephalopathy not entirely clear but thought to be secondary to narcotics.  He does have underlying bipolar disorder.  Diabetes mellitus type 2 SSI. Holding glipizide.  HbA1c 5.9.  Hyperkalemia Patient was given Lokelma with improvement in  potassium level.  Potassium level noted to be stable today.  Hypernatremia Seems to have resolved.  CKDIIIb Renal function stable for the most part.  Mild rise in creatinine was noted yesterday.  Improved today after IV fluid rate was increased.  Continue to monitor urine output.     Hypercalcemia of malignancy Medical oncology addressing this issue.  May need to consider pamidronate if calcium levels continue to rise.  He remains on IV fluids.    Bipolar disorder On depakote, seroquel at home.  Being continued.  Hyperlipidemia Continue Lipitor  Normocytic anemia No evidence of overt bleeding.  Hemoglobin is stable.  Essential hypertension/sinus tachycardia Holding lisinopril well-controlled.  Also noted to have mild sinus tachycardia which could be related to his agitation.  Unable to determine if patient has any pain or discomfort.  TSH was normal.  EKG still pending.  Heart rate seems to be better today.   Not noted to be on any beta-blockers at home.  History of BPH Continue Flomax.    Likely peripheral artery disease Noted to have colder right foot compared to the left, and colder feet than hands - adequate capillary refill.  DVT prophylaxis: Lovenox Code Status: DNR Family Communication: Discussed at length at bedside with family about prognosis, current goals of care and treatment options Disposition: SNF was recommended by PT and OT - family requesting HHPT if stable for home after completion of radiation  Status is: Inpatient  Remains inpatient appropriate because:Inpatient level of care appropriate due to severity of illness   Dispo: The patient is from: Home              Anticipated d/c is to: pending likely Home given family discussion              Anticipated d/c date is: > 3 days              Patient currently is not medically stable to d/c.  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 Biopsied. - Gastritis. Biopsied. - Erythematous duodenopathy. Biopsied. - Normal second portion of the duodenum. Recommendations Soft diet. - Continue present medications. - Await pathology results. - Follow up with Oncology - Use Protonix  (pantoprazole) 40 mg PO BID. - Use sucralfate suspension 1 gram PO QID. - Called his wife and discussed endoscopic findings - GI is available if needed, please call with any questions  Antimicrobials:  Anti-infectives (From admission, onward)   None        Subjective: Patient remains pleasantly confused.  Does not have any pain at this time.  Remains distracted.  Objective: Vitals:   01/02/20 0537 01/02/20 1154 01/02/20 2109 01/03/20 0535  BP: 132/77 101/72 125/87 118/65  Pulse: (!) 110 (!) 110 (!) 103 99  Resp: 16 16 16 16   Temp: 98.3 F (36.8 C) (!) 97.5 F (36.4 C) 97.7 F (36.5 C) 98.6 F (37 C)  TempSrc: Oral Oral Oral Oral  SpO2: 99% 97% 97% 97%  Weight:      Height:        Intake/Output Summary (Last 24 hours) at 01/03/2020 0745 Last data filed at 01/03/2020 0657 Gross per 24 hour  Intake 1572.15 ml  Output 1500 ml  Net 72.15 ml   Filed Weights   01/07/2020 1700 12/28/19 0508  Weight: 92.4 kg 93.5 kg    Examination:  General appearance: Awake alert.  In no distress.  Distracted Resp: Clear to auscultation bilaterally.  Normal effort Cardio: S1-S2 is normal regular.  No S3-S4.  No rubs murmurs or bruit GI: Abdomen is soft.  Noted to be mildly tender in the right upper quadrant without any rebound rigidity or guarding.  No masses organomegaly.   Extremities: No edema.  Right leg is noted to be colder compared to the left.  Pulses are also not as well palpated in the right foot. Neurologic: Remains disoriented.  No focal neurological deficits.      Data Reviewed: I have personally reviewed following labs and imaging studies  CBC: Recent Labs  Lab 12/13/2019 1748 12/28/19 0555 12/21/2019 0714 12/30/19 0643 12/31/19 0629 01/01/20 0545 01/03/20 0705  WBC 13.1*   < > 12.1* 10.2 11.8* 12.3* 9.5  NEUTROABS 9.6*  --  8.3* 7.1 8.3*  --   --   HGB 11.9*   < > 11.5* 11.1* 11.5* 11.4* 11.4*  HCT 38.7*   < > 37.5* 38.9* 37.9* 38.2* 37.1*  MCV 88.4   < > 88.0  94.0 89.2 90.3 89.4  PLT 256   < > 213 204 199 189 242   < > = values in this interval not displayed.    Basic Metabolic Panel: Recent Labs  Lab 12/26/2019 1748 12/28/19 0555 12/28/2019 0714 12/30/19 0643 12/31/19 0629 01/01/20 0545 01/02/20 0557  NA 137   < > 145 146* 143 141 141  K 4.8   < > 5.1 5.5* 4.4 4.3 4.1  CL 101   < > 108 110 107 104 103  CO2 25   < > 27 25 26 26 29   GLUCOSE 92   < > 123* 150* 114* 119* 125*  BUN 51*   < > 34* 30* 20 24* 26*  CREATININE 1.89*   < > 1.65* 1.82* 1.51* 1.68* 1.54*  CALCIUM 10.9*   < > 10.6* 10.0 10.2 10.6* 10.8*  MG 2.5*  --  2.3 2.4 1.8  --   --   PHOS 4.4  --  3.7 4.6 2.8  --   --    < > = values in this interval not displayed.    GFR: Estimated Creatinine Clearance: 50.9 mL/min (A) (by C-G formula based on SCr of 1.54 mg/dL (H)).  Liver Function Tests: Recent Labs  Lab 12/28/19 0555 12/23/2019 0714 12/30/19 0643 12/31/19 0629 01/01/20 0545  AST 22 21 23 19 19   ALT 12 13 11 12 13   ALKPHOS 88 96 88 92 90  BILITOT 0.4 0.3 0.6 0.3 0.4  PROT 6.3* 6.1* 5.7* 5.8* 5.7*  ALBUMIN 2.9* 2.8* 2.6* 2.5* 2.5*    CBG: Recent Labs  Lab 01/02/20 0726 01/02/20 1158 01/02/20 1724 01/02/20 2105 01/03/20 0730  GLUCAP 151* 104* 159* 117* 121*     Recent Results (from the past 240 hour(s))  Respiratory Panel by RT PCR (Flu A&B, Covid) - Nasopharyngeal Swab     Status: None   Collection Time: 12/19/2019  5:20 PM   Specimen: Nasopharyngeal Swab  Result Value Ref Range Status   SARS Coronavirus 2 by RT PCR NEGATIVE NEGATIVE Final    Comment: (NOTE) SARS-CoV-2 target nucleic acids are NOT DETECTED.  The SARS-CoV-2 RNA is generally detectable in upper respiratoy specimens during the acute phase of infection. The lowest concentration of SARS-CoV-2 viral copies this assay can detect is 131 copies/mL. A negative result does not preclude SARS-Cov-2 infection and should not be used as the sole basis for treatment or other patient management  decisions. A negative result may occur with  improper specimen collection/handling, submission of specimen other than nasopharyngeal swab, presence of viral mutation(s) within the areas targeted by this assay, and inadequate number of viral copies (<131 copies/mL). A negative result must be combined with clinical observations, patient history, and epidemiological information. The expected result is Negative.  Fact Sheet for Patients:  PinkCheek.be  Fact Sheet for Healthcare Providers:  GravelBags.it  This test is no t yet approved or cleared by the [place] FDA and  has been authorized for detection and/or diagnosis of SARS-CoV-2 by FDA under an Emergency Use Authorization (EUA). This EUA will remain  in effect (meaning this test can be used) for the duration of the COVID-19 declaration under Section 564(b)(1) of the Act, 21 U.S.C. section 360bbb-3(b)(1), unless the authorization is terminated or revoked sooner.     Influenza A by PCR NEGATIVE NEGATIVE Final   Influenza B by PCR NEGATIVE NEGATIVE Final    Comment: (NOTE) The Xpert Xpress SARS-CoV-2/FLU/RSV assay is intended as an aid in  the diagnosis of influenza from Nasopharyngeal swab specimens and  should not be used as a sole basis for treatment. Nasal washings and  aspirates are unacceptable for Xpert Xpress SARS-CoV-2/FLU/RSV  testing.  Fact Sheet for Patients: PinkCheek.be  Fact Sheet for Healthcare Providers: GravelBags.it  This test is not yet approved or cleared by the [place] FDA and  has been authorized for detection and/or diagnosis of SARS-CoV-2 by  FDA under an Emergency Use Authorization (EUA). This EUA will remain  in effect (meaning this test can be used) for the duration of the  Covid-19 declaration under Section 564(b)(1) of the Act, 21  U.S.C. section 360bbb-3(b)(1), unless the  authorization is  terminated or revoked. Performed at [facility], [place] [address], [place], [place] [address]   Culture, Urine     Status: Abnormal   Collection Time: 01/07/2020  9:45 PM   Specimen: Urine, Random  Result Value Ref Range Status   Specimen Description   Final    URINE, RANDOM Performed at [facility] [address] [place], [place] [address]    Special Requests   Final    NONE Performed at [facility], [place] [address] [place], [place] [address]    Culture (A)  Final    <10,000 COLONIES/mL INSIGNIFICANT GROWTH Performed at [facility] [address] [place], [place] [address]    Report Status 12/31/2019 FINAL  Final         Radiology Studies: No results found.    Scheduled Meds: . atorvastatin  10 mg Oral q1800  . divalproex  1,500 mg Oral QHS  . enoxaparin (LOVENOX) injection  40 mg Subcutaneous Q24H  . feeding supplement  237 mL Oral BID BM  . insulin aspart  0-9 Units Subcutaneous TID WC  . morphine  7.5 mg Oral Q6H  . pantoprazole  40 mg Oral BID  . QUEtiapine  25 mg Oral QHS  . senna  2 tablet Oral BID  . sucralfate  1 g Oral TID WC & HS  . tamsulosin  0.4 mg Oral Daily   Continuous Infusions: . dextrose 5 % and 0.45% NaCl 75 mL/hr at 01/03/20 0420     LOS: 7 days     Little Ishikawa, DO [facility]   To contact the attending provider between 7A-7P or the covering provider during after hours 7P-7A, please log into the web site [email or web address]  and access using universal [place] password for that web site. If you do not have the password, please call the hospital operator.  01/03/2020, 7:45 AM

## 2020-01-04 DIAGNOSIS — C7951 Secondary malignant neoplasm of bone: Secondary | ICD-10-CM | POA: Diagnosis not present

## 2020-01-04 DIAGNOSIS — C787 Secondary malignant neoplasm of liver and intrahepatic bile duct: Secondary | ICD-10-CM | POA: Diagnosis not present

## 2020-01-04 DIAGNOSIS — K2289 Other specified disease of esophagus: Secondary | ICD-10-CM | POA: Diagnosis not present

## 2020-01-04 DIAGNOSIS — R948 Abnormal results of function studies of other organs and systems: Secondary | ICD-10-CM | POA: Diagnosis not present

## 2020-01-04 DIAGNOSIS — Z7189 Other specified counseling: Secondary | ICD-10-CM | POA: Diagnosis not present

## 2020-01-04 DIAGNOSIS — Z515 Encounter for palliative care: Secondary | ICD-10-CM | POA: Diagnosis not present

## 2020-01-04 LAB — GLUCOSE, CAPILLARY
Glucose-Capillary: 103 mg/dL — ABNORMAL HIGH (ref 70–99)
Glucose-Capillary: 143 mg/dL — ABNORMAL HIGH (ref 70–99)
Glucose-Capillary: 107 mg/dL — ABNORMAL HIGH (ref 70–99)
Glucose-Capillary: 110 mg/dL — ABNORMAL HIGH (ref 70–99)

## 2020-01-04 MED ORDER — MORPHINE SULFATE (PF) 2 MG/ML IV SOLN
2.0000 mg | INTRAVENOUS | Status: DC | PRN
  Administered 2020-01-05 – 2020-01-07 (×9): 2 mg via INTRAVENOUS
  Filled 2020-01-04 (×10): qty 1

## 2020-01-04 NOTE — Progress Notes (Deleted)
Orders are for MS Contin 7.5 mg q 12 hours, but the MAR shows 15 mg.   I gave 7.5 mg per the order but when I tried to waste the other 7.5 in Pyxis, it would not take the waste.  Adela Ports, RN, CN, witnessed the waste in the [facility].

## 2020-01-04 NOTE — Progress Notes (Signed)
Pts HR is 126, triggering a MEWS score of 2 (yellow). Tachycardia is not a new issue for this patient per the notes and MD is aware. Continue to monitor. Hortencia Conradi RN

## 2020-01-04 NOTE — Progress Notes (Signed)
PROGRESS NOTE    Andrew Warren  XQJ:[id number] DOB: 09-09-1950 DOA: 12/22/2019 PCP: [name], MD   No chief complaint on file.  Brief Narrative:  Andrew Warren is a 69 y.o. male with medical history significant of bipolar disorder, HTN, HLD, and metastatic cancer who presents to the hospital after a clinic visit. Per oncology note, he was seen by orthopedics outpatient who diagnosed cancer via MRI and he was referred to oncology in early October 10/21.  He has not had any definitive diagnosis with biopsy yet.  He's taking morphine for pain in his back, chest and upper abdomen.  He's noticed recent urinary frequency and incontinence over the past 2 days.  He hasn't had a BM in weeks.  He's slower to speak and respond since heing on morphine.  His wife helps him ambulate, use the bathroom over the past week.  Seems like he's been weaker over the apst several weeks.  He notes he's constipated and has issues urinating.  C/o numbness to groin.  He's unable to clearly describe timing.  He complains of pain, but isn't able to describe this well.  No smoking or etoh per his report.    Patient underwent MRI of his spine.  Also underwent EGD with biopsy.  Oncology has been following.  Plan is for and if radiation treatments.  Disposition is unclear at this time given apparently worsening condition over the past 48 hours with worsening mental status, unclear if patient will be able to continue to tolerate palliative radiation much less immunotherapy which was discussed previously by oncology team.  Palliative care continues to be involved, discussion with patient's son [name] in the next 24 hours as well as current wife [name] for further outlining of poor prognosis in the setting of worsening mental status and clinical decline.  Assessment & Plan:   Goals of care discussion  Lengthy discussion today at bedside with [name] patient's wife about poor prognosis given his advanced disease, currently on  palliative radiation therapy, it is clear that [name] understands palliative radiation is certainly not curative and is meant to improve patient's quality of life as his disease likely spreads.  Will discuss with patient's son [name] tomorrow as he will be at bedside for family discussion about goals of care.  At this time patient is unable to tolerate even palliative radiation, markedly altered today unable to properly take p.o. which will only accelerate his decline.  At this time I would like to focus on comfort and quality of life rather than quantity of life.  Hopefully Mr. Quast will be more awake tomorrow to interact with his son [name] and have meaningful discussion about goals of care.  Saddle Anesthesia/Concern for Cord Compression in setting of metastatic disease:  MRI c spine with scattered osseous metastatic dz involving the cervical spine (see report) MRI with extensive diffuse osseous mets throughout visualized thoracolumbar spine and pelvis.  Early extraosseous extension of tumor at level of the sacrum /S1 segment with tumor encroaching upon the descending S1 nerve roots bilaterally, R > L.  Slight mass effect on the descending R S1 nerve root without overt neural impingement.  No cord or cauda equina compression.  (see report)  thoracic MRI with diffuse osseous metastatic disease MRI brain canceled due to inability to tolerate  Per Dr. [name] no compressive lesion in lumbar region or conus.  No surgical intervention was recommended. UA not c/w UTI. Seems to be stable from a neurological standpoint.  Poorly differentiated adenocarcinoma  with metastases Patient seen by oncology.   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   Plan is for ongoing palliative radiation treatment: dose 2 of 10 completed yesterday PET 12/12/19 with  widespread metastatic dz including to bones, liver, thoracoabdominal nodes, L adrenal gland.   Palliative care c/s -> DNR, appreciate palliative recs Esophageal mass biopsy came back as poorly differentiated adenocarcinoma.  Oncology has discussed with patient and family.  Plan is to continue with palliative radiation.  Further testing has been ordered to see if he is a candidate for immunotherapy or targeted therapy.  Patient not a candidate for intensive chemotherapy. May also be reasonable to consider palliative care along with hospice.  All these issues were discussed with family and care team.  Acute Metabolic versus toxic encephalopathy, ongoing Ammonia B12 and TSH levels were normal. Unable to tolerate MRI brain due to pain  Mentation seems to be stable though he remains confused.  Patient has notable bipolar disorder however this appears to be more metabolic versus toxic given patient is on narcotics at this point for pain control it certainly could be worsening a baseline metabolic encephalopathy in the setting of metastatic brain lesions.  Diabetes mellitus type 2 SSI. Holding glipizide.  HbA1c 5.9.  Hyperkalemia Hold off on further lab work pending further goals of care discussion.  Hypernatremia Hold off on further lab work pending further goals of care discussion  CKDIIIb Hold off on further lab work pending further goals of care discussion  Hypercalcemia of malignancy Hold off on further lab work pending further goals of care discussion, consider IV fluids if not tolerating p.o. safely in the next 12 to 24 hours  Bipolar disorder On depakote, seroquel at home.  Continue as able to tolerate p.o. safely.  Hyperlipidemia Continue Lipitor  Normocytic anemia Hold off on further lab work pending further goals of care discussion.  Essential hypertension/sinus tachycardia Tachycardia and hypertensive events likely in setting of pain, continue pain control given goals of  care discussion as above  History of BPH Continue Flomax.    Likely peripheral artery disease Noted to have colder right foot compared to the left, and colder feet than hands - adequate capillary refill.  DVT prophylaxis: Lovenox Code Status: DNR Family Communication: Discussed at length at bedside with family about prognosis, current goals of care and treatment options Disposition: SNF was recommended by PT and OT - family requesting HHPT if stable for home after completion of radiation  Status is: Inpatient  Remains inpatient appropriate because:Inpatient level of care appropriate due to severity of illness   Dispo: The patient is from: Home              Anticipated d/c is to: pending likely home given previous family discussion.  At this point home hospice would likely be most reasonable, awaiting family meeting on January 05, 2020.              Anticipated d/c date is: > 3 days              Patient currently is not medically stable to d/c.  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 Biopsied. - Gastritis. Biopsied. - Erythematous duodenopathy. Biopsied. - Normal second portion of the duodenum. Recommendations Soft diet. -  Continue present medications. - Await pathology results. - Follow up with Oncology - Use Protonix (pantoprazole) 40 mg PO BID. - Use sucralfate suspension 1 gram PO QID. - Called his wife and discussed endoscopic findings - GI is available if needed, please call with any questions  Antimicrobials:  Anti-infectives (From admission, onward)   None        Subjective: Patient markedly somnolent this morning, poorly interactive, answers single word sentence yes/no but otherwise not interactive, tolerating p.o. poorly per wife at bedside and otherwise not participating in discussion today.  Objective: Vitals:   01/03/20 1449 01/03/20 1452 01/03/20  2126 01/04/20 0545  BP: 98/60 113/73 107/72 101/75  Pulse: (!) 112 (!) 105 (!) 109 (!) 126  Resp: 16  16   Temp: 98.6 F (37 C)  98.5 F (36.9 C) (!) 97.5 F (36.4 C)  TempSrc: Oral  Oral Oral  SpO2: 99%  97% 91%  Weight:      Height:        Intake/Output Summary (Last 24 hours) at 01/04/2020 0748 Last data filed at 01/03/2020 2126 Gross per 24 hour  Intake 580 ml  Output 1002 ml  Net -422 ml   Filed Weights   12/14/2019 1700 12/28/19 0508  Weight: 92.4 kg 93.5 kg    Examination:  General appearance: Somnolent, poorly arousable, no acute distress Resp: Clear to auscultation bilaterally.  Normal effort Cardio: S1-S2 is normal regular.  No S3-S4.  No rubs murmurs or bruit GI: Abdomen is soft.  Noted to be mildly tender in the right upper quadrant without any rebound rigidity or guarding.  No masses organomegaly.   Extremities: No edema.  BLE cool to touch Neurologic: Remains somnolent/poorly interactive.  No focal neurological deficits.   Data Reviewed: I have personally reviewed following labs and imaging studies  CBC: Recent Labs  Lab 12/24/2019 0714 12/30/19 0643 12/31/19 0629 01/01/20 0545 01/03/20 0705  WBC 12.1* 10.2 11.8* 12.3* 9.5  NEUTROABS 8.3* 7.1 8.3*  --   --   HGB 11.5* 11.1* 11.5* 11.4* 11.4*  HCT 37.5* 38.9* 37.9* 38.2* 37.1*  MCV 88.0 94.0 89.2 90.3 89.4  PLT 213 204 199 189 350    Basic Metabolic Panel: Recent Labs  Lab 12/28/2019 0714 12/28/2019 0714 12/30/19 0643 12/31/19 0629 01/01/20 0545 01/02/20 0557 01/03/20 0705  NA 145   < > 146* 143 141 141 141  K 5.1   < > 5.5* 4.4 4.3 4.1 4.1  CL 108   < > 110 107 104 103 104  CO2 27   < > 25 26 26 29 27   GLUCOSE 123*   < > 150* 114* 119* 125* 122*  BUN 34*   < > 30* 20 24* 26* 29*  CREATININE 1.65*   < > 1.82* 1.51* 1.68* 1.54* 1.60*  CALCIUM 10.6*   < > 10.0 10.2 10.6* 10.8* 10.9*  MG 2.3  --  2.4 1.8  --   --   --   PHOS 3.7  --  4.6 2.8  --   --   --    < > = values in this interval not  displayed.    GFR: Estimated Creatinine Clearance: 49 mL/min (A) (by C-G formula based on SCr of 1.6 mg/dL (H)).  Liver Function Tests: Recent Labs  Lab 01/01/2020 0714 12/30/19 0643 12/31/19 0629 01/01/20 0545 01/03/20 0705  AST 21 23 19 19 22   ALT 13 11 12 13 19   ALKPHOS 96 88 92 90 91  BILITOT 0.3 0.6 0.3 0.4 0.3  PROT 6.1* 5.7* 5.8* 5.7* 5.9*  ALBUMIN 2.8* 2.6* 2.5* 2.5* 2.5*    CBG: Recent Labs  Lab 01/02/20 2105 01/03/20 0730 01/03/20 1151 01/03/20 1724 01/03/20 2240  GLUCAP 117* 121* 146* 114* 107*     Recent Results (from the past 240 hour(s))  Respiratory Panel by RT PCR (Flu A&B, Covid) - Nasopharyngeal Swab     Status: None   Collection Time: 12/15/2019  5:20 PM   Specimen: Nasopharyngeal Swab  Result Value Ref Range Status   SARS Coronavirus 2 by RT PCR NEGATIVE NEGATIVE Final    Comment: (NOTE) SARS-CoV-2 target nucleic acids are NOT DETECTED.  The SARS-CoV-2 RNA is generally detectable in upper respiratoy specimens during the acute phase of infection. The lowest concentration of SARS-CoV-2 viral copies this assay can detect is 131 copies/mL. A negative result does not preclude SARS-Cov-2 infection and should not be used as the sole basis for treatment or other patient management decisions. A negative result may occur with  improper specimen collection/handling, submission of specimen other than nasopharyngeal swab, presence of viral mutation(s) within the areas targeted by this assay, and inadequate number of viral copies (<131 copies/mL). A negative result must be combined with clinical observations, patient history, and epidemiological information. The expected result is Negative.  Fact Sheet for Patients:  PinkCheek.be  Fact Sheet for Healthcare Providers:  GravelBags.it  This test is no t yet approved or cleared by the [place] FDA and  has been authorized for detection and/or  diagnosis of SARS-CoV-2 by FDA under an Emergency Use Authorization (EUA). This EUA will remain  in effect (meaning this test can be used) for the duration of the COVID-19 declaration under Section 564(b)(1) of the Act, 21 U.S.C. section 360bbb-3(b)(1), unless the authorization is terminated or revoked sooner.     Influenza A by PCR NEGATIVE NEGATIVE Final   Influenza B by PCR NEGATIVE NEGATIVE Final    Comment: (NOTE) The Xpert Xpress SARS-CoV-2/FLU/RSV assay is intended as an aid in  the diagnosis of influenza from Nasopharyngeal swab specimens and  should not be used as a sole basis for treatment. Nasal washings and  aspirates are unacceptable for Xpert Xpress SARS-CoV-2/FLU/RSV  testing.  Fact Sheet for Patients: PinkCheek.be  Fact Sheet for Healthcare Providers: GravelBags.it  This test is not yet approved or cleared by the [place] FDA and  has been authorized for detection and/or diagnosis of SARS-CoV-2 by  FDA under an Emergency Use Authorization (EUA). This EUA will remain  in effect (meaning this test can be used) for the duration of the  Covid-19 declaration under Section 564(b)(1) of the Act, 21  U.S.C. section 360bbb-3(b)(1), unless the authorization is  terminated or revoked. Performed at [facility], [place] [address], [place], [place] [address]   Culture, Urine     Status: Abnormal   Collection Time: 12/23/2019  9:45 PM   Specimen: Urine, Random  Result Value Ref Range Status   Specimen Description   Final    URINE, RANDOM Performed at [facility] [address], [place], [place] [address]    Special Requests   Final    NONE Performed at [facility], [place] [address], [place], [place] [address]    Culture (A)  Final    <10,000 COLONIES/mL INSIGNIFICANT GROWTH Performed at [facility] [address], [place], [place] [address]     Report Status 12/31/2019 FINAL  Final  Radiology Studies: No results found.    Scheduled Meds: . acetaminophen (TYLENOL) oral liquid 160 mg/5 mL  1,000 mg Oral TID  . atorvastatin  10 mg Oral q1800  . divalproex  1,500 mg Oral QHS  . enoxaparin (LOVENOX) injection  40 mg Subcutaneous Q24H  . feeding supplement  237 mL Oral BID BM  . insulin aspart  0-9 Units Subcutaneous TID WC  . morphine  15 mg Oral Q12H  . pantoprazole  40 mg Oral BID  . QUEtiapine  25 mg Oral QHS  . senna  2 tablet Oral BID  . sucralfate  1 g Oral TID WC & HS  . tamsulosin  0.4 mg Oral Daily   Continuous Infusions:    LOS: 8 days   Time Spent: >77min  Little Ishikawa, DO [facility]   To contact the attending provider between 7A-7P or the covering provider during after hours 7P-7A, please log into the web site [email or web address] and access using universal [place] password for that web site. If you do not have the password, please call the hospital operator.  01/04/2020, 7:48 AM

## 2020-01-04 NOTE — Progress Notes (Signed)
Palliative:  HPI:69 y.o.malewith past medical history of rheumatoid arthritis, CKD, DM, and bipoloar disorder. He was diagnosed with cancer in early October but was in the process of an outpatient work up and was unaware of the extent of his cancer. He was admitted on11/17/2021with encephalopathy and urinary incontinence.Imaging shows metastasis thru his entire spine encroaching on the S1 nerve root, as well as mets in the liver and thoracoabdominal nodes. EGD done 11/19 showed a large distal esophageal ulcerating mass. Biopsies were taken.Undergoing radiation therapy.   I met today at [facility] bedside. Wife, [name], present. He did not tolerate radiation yesterday. We discussed concern that he is needing so much medication to treat pain/anxiety for radiation that he is then having lethargy, decreased intake, and increased confusion/delirium which will also lead to decline. Do to this I am unsure how much he will actually benefit from radiation therapy. Kim understands and sees the same decline and discussed with Dr. Avon Gully this morning. I worry that his mental status continues to decline and he continues with pain as well. We discussed that it is not unreasonable to consider focus on comfort and even hospice. [name] is concerned that her family will not agree. Their son will be present tomorrow and she would like her him to speak with medical team for a clearer picture of Andrew Warren's diagnosis and prognosis. She requests that these conversations occur outside patient room.   All questions/concerns addressed. Emotional support provided. Discussed with Dr. Avon Gully.   Exam: Alert, confused. Restless. Delirium. Complains of pain. Breathing regular, unlabored. Abd soft.   Plan: - Ongoing goals of care discussions.  Plan: - Bone mets pain:              - MS contin 15 mg every 12 hours.              - MSIR 7.5 mg every 4 hours prn.              - Tylenol 1000 mg TID.              - May consider  decadron although I worry this will worsen delirium/confusion. May also consider bisphosphonates.  - Please give haldol 1 mg IV prior to radiation treatment as this seemed to 69/23.  - Use slide board and move carefully.  - Continue PT/OT. Noted that he stood and took a few steps with assistance today.   [place], NP Palliative Medicine Team Pager [phone] (Please see [email or web address] for schedule) Team Phone [phone]    Greater than 50%  of this time was spent counseling and coordinating care related to the above assessment and plan

## 2020-01-05 DIAGNOSIS — R531 Weakness: Secondary | ICD-10-CM

## 2020-01-05 DIAGNOSIS — Z515 Encounter for palliative care: Secondary | ICD-10-CM | POA: Diagnosis not present

## 2020-01-05 DIAGNOSIS — C787 Secondary malignant neoplasm of liver and intrahepatic bile duct: Secondary | ICD-10-CM | POA: Diagnosis not present

## 2020-01-05 DIAGNOSIS — Z7189 Other specified counseling: Secondary | ICD-10-CM

## 2020-01-05 LAB — GLUCOSE, CAPILLARY
Glucose-Capillary: 136 mg/dL — ABNORMAL HIGH (ref 70–99)
Glucose-Capillary: 78 mg/dL (ref 70–99)
Glucose-Capillary: 91 mg/dL (ref 70–99)
Glucose-Capillary: 113 mg/dL — ABNORMAL HIGH (ref 70–99)
Glucose-Capillary: 165 mg/dL — ABNORMAL HIGH (ref 70–99)

## 2020-01-05 NOTE — Progress Notes (Signed)
   01/05/20 1420  Assess: MEWS Score  Temp 99.3 F (37.4 C)  BP 99/62  Pulse Rate (!) 120  Resp 18  SpO2 95 %  O2 Device Nasal Cannula  Assess: MEWS Score  MEWS Temp 0  MEWS Systolic 1  MEWS Pulse 2  MEWS RR 0  MEWS LOC 0  MEWS Score 3  MEWS Score Color Yellow  Assess: if the MEWS score is Yellow or Red  Were vital signs taken at a resting state? Yes  Focused Assessment Change from prior assessment (see assessment flowsheet)  Early Detection of Sepsis Score *See Row Information* Low  MEWS guidelines implemented *See Row Information* Yes  Take Vital Signs  Increase Vital Sign Frequency  Yellow: Q 2hr X 2 then Q 4hr X 2, if remains yellow, continue Q 4hrs  Escalate  MEWS: Escalate Yellow: discuss with charge nurse/RN and consider discussing with provider and RRT  Notify: Charge Nurse/RN  Name of Charge Nurse/RN Notified [name] [name])  Date Charge Nurse/RN Notified 01/05/20  Time Charge Nurse/RN Notified 1430  Notify: Provider  Provider Name/Title Dr Avon Gully  Date Provider Notified 01/05/20  Time Provider Notified 1435  Notification Type Page  Notification Reason Other (Comment)  Response No new orders  Date of Provider Response 01/05/20  Time of Provider Response 1500  Document  Patient Outcome Other (Comment)  Progress note created (see row info) Yes

## 2020-01-05 NOTE — Progress Notes (Signed)
PROGRESS NOTE    Andrew Warren  KXF:[id number] DOB: [birth year] DOA: 12/31/2019 PCP: Andrew Percy, MD   No chief complaint on file.  Brief Narrative:  Andrew Warren is a 69 y.o. male with medical history significant of bipolar disorder, HTN, HLD, and metastatic cancer who presents to the hospital after a clinic visit. Per oncology note, he was seen by orthopedics outpatient who diagnosed cancer via MRI and he was referred to oncology in early October 10/21.  He has not had any definitive diagnosis with biopsy yet.  He's taking morphine for pain in his back, chest and upper abdomen.  He's noticed recent urinary frequency and incontinence over the past 2 days.  He hasn't had a BM in weeks.  He's slower to speak and respond since heing on morphine.  His wife helps him ambulate, use the bathroom over the past week.  Seems like he's been weaker over the apst several weeks.  He notes he's constipated and has issues urinating.  C/o numbness to groin.  He's unable to clearly describe timing.  He complains of pain, but isn't able to describe this well.  No smoking or etoh per his report.    Patient underwent MRI of his spine.  Also underwent EGD with biopsy.  Oncology has been following.  Plan is for and if radiation treatments.  Disposition is unclear at this time given apparently worsening condition over the past 48 hours with worsening mental status, unclear if patient will be able to continue to tolerate palliative radiation much less immunotherapy which was discussed previously by oncology team.  Palliative care continues to be involved, discussion with patient's son Andrew Warren in the next 24 hours as well as current wife Andrew Warren for further outlining of poor prognosis in the setting of worsening mental status and clinical decline.  Assessment & Plan:    Goals of care discussion  Lengthy discussion today at bedside with Andrew Warren patient's son about poor prognosis given his advanced disease, currently on  palliative radiation therapy, it is clear that he understands palliative radiation is certainly not curative and is meant to improve patient's quality of life as his disease likely spreads. At this time patient is unable to tolerate even palliative radiation, markedly altered today unable to properly take p.o. which will only accelerate his decline.  At this time I would like to focus on comfort and quality of life rather than quantity of life.  Hopefully Andrew Warren will be more awake tomorrow to interact with his son Andrew Warren and have meaningful discussion about goals of care.  Saddle Anesthesia/Concern for Cord Compression in setting of metastatic disease:  MRI c spine with scattered osseous metastatic dz involving the cervical spine (see report) MRI with extensive diffuse osseous mets throughout visualized thoracolumbar spine and pelvis.  Early extraosseous extension of tumor at level of the sacrum /S1 segment with tumor encroaching upon the descending S1 nerve roots bilaterally, R > L.  Slight mass effect on the descending R S1 nerve root without overt neural impingement.  No cord or cauda equina compression.  (see report)  thoracic MRI with diffuse osseous metastatic disease MRI brain canceled due to inability to tolerate  Per Dr. [name] no compressive lesion in lumbar region or conus.  No surgical intervention was recommended. UA not c/w UTI. Seems to be stable from a neurological standpoint.  Poorly differentiated adenocarcinoma with metastases Patient seen by oncology.   S/p EGD by GI -> likely malignant esophageal tumor was found in distal  esophagus - biopsied, gastritis biopsied, duodenopathy biopsied -> PPI and sucralfate Patient was also seen by radiation oncology.   Plan is for ongoing palliative radiation treatment: dose 2 of 10 completed yesterday PET 12/12/19 with widespread metastatic dz including to bones, liver, thoracoabdominal nodes, L adrenal gland.   Palliative care c/s -> DNR,  appreciate palliative recs Esophageal mass biopsy came back as poorly differentiated adenocarcinoma.  Oncology has discussed with patient and family.  Plan is to continue with palliative radiation.  Further testing has been ordered to see if he is a candidate for immunotherapy or targeted therapy.  Patient not a candidate for intensive chemotherapy. May also be reasonable to consider palliative care along with hospice.  All these issues were discussed with family and care team.  Acute Metabolic versus toxic encephalopathy, ongoing Ammonia B12 and TSH levels were normal. Unable to tolerate MRI brain due to pain  Mentation seems to be stable though he remains confused.  Patient has notable bipolar disorder however this appears to be more metabolic versus toxic given patient is on narcotics at this point for pain control it certainly could be worsening a baseline metabolic encephalopathy in the setting of metastatic brain lesions.  Diabetes mellitus type 2 SSI. Holding glipizide.  HbA1c 5.9.  Hyperkalemia Hold off on further lab work pending further goals of care discussion.  Hypernatremia Hold off on further lab work pending further goals of care discussion  CKDIIIb Hold off on further lab work pending further goals of care discussion  Hypercalcemia of malignancy Hold off on further lab work pending further goals of care discussion, consider IV fluids if not tolerating p.o. safely in the next 12 to 24 hours  Bipolar disorder On depakote, seroquel at home.  Continue as able to tolerate p.o. safely.  Hyperlipidemia Continue Lipitor  Normocytic anemia Hold off on further lab work pending further goals of care discussion.  Essential hypertension/sinus tachycardia Tachycardia and hypertensive events likely in setting of pain, continue pain control given goals of care discussion as above  History of BPH Continue Flomax.    Likely peripheral artery disease Noted to have colder  right foot compared to the left, and colder feet than hands - adequate capillary refill.  DVT prophylaxis: Lovenox Code Status: DNR Family Communication: Discussed at length at bedside with family about prognosis, current goals of care and treatment options Disposition: SNF was recommended by PT and OT - family requesting HHPT if stable for home after completion of radiation  Status is: Inpatient  Remains inpatient appropriate because:Inpatient level of care appropriate due to severity of illness   Dispo: The patient is from: Home              Anticipated d/c is to: pending likely home given previous family discussion.  At this point home hospice would likely be most reasonable, awaiting family meeting on January 05, 2020.              Anticipated d/c date is: > 3 days              Patient currently is not medically stable to d/c.  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 Biopsied. - Gastritis. Biopsied. - Erythematous duodenopathy. Biopsied. - Normal second portion of the duodenum. Recommendations Soft diet. - Continue present medications. - Await pathology results. - Follow up with Oncology - Use Protonix (pantoprazole) 40 mg PO BID. -  Use sucralfate suspension 1 gram PO QID. - Called his wife and discussed endoscopic findings - GI is available if needed, please call with any questions  Antimicrobials:  Anti-infectives (From admission, onward)   None      Subjective: Patient markedly somnolent this morning, poorly interactive, answers single word sentence yes/no but otherwise not interactive, tolerating p.o. poorly per wife at bedside and otherwise not participating in discussion today.  Objective: Vitals:   01/04/20 1656 01/04/20 2158 01/05/20 0104 01/05/20 0503  BP: 105/67 (!) 159/118 (!) 94/59 (!) 90/55  Pulse: (!) 108 97 100 97  Resp: 17 14 14 16   Temp: 97.6 F  (36.4 C) 98.5 F (36.9 C) 98.1 F (36.7 C) 98.2 F (36.8 C)  TempSrc: Oral Oral Oral Oral  SpO2: 99% 97% 92% 96%  Weight:      Height:        Intake/Output Summary (Last 24 hours) at 01/05/2020 0811 Last data filed at 01/04/2020 1700 Gross per 24 hour  Intake 880 ml  Output --  Net 880 ml   Filed Weights   01/03/2020 1700 12/28/19 0508  Weight: 92.4 kg 93.5 kg    Examination:  General appearance: Somnolent, poorly arousable, no acute distress Resp: Clear to auscultation bilaterally.  Normal effort Cardio: S1-S2 is normal regular.  No S3-S4.  No rubs murmurs or bruit GI: Abdomen is soft.  Noted to be mildly tender in the right upper quadrant without any rebound rigidity or guarding.  No masses organomegaly.   Extremities: No edema.  BLE cool to touch Neurologic: Remains somnolent/poorly interactive.  No focal neurological deficits.   Data Reviewed: I have personally reviewed following labs and imaging studies  CBC: Recent Labs  Lab 12/30/19 0643 12/31/19 0629 01/01/20 0545 01/03/20 0705  WBC 10.2 11.8* 12.3* 9.5  NEUTROABS 7.1 8.3*  --   --   HGB 11.1* 11.5* 11.4* 11.4*  HCT 38.9* 37.9* 38.2* 37.1*  MCV 94.0 89.2 90.3 89.4  PLT 204 199 189 333    Basic Metabolic Panel: Recent Labs  Lab 12/30/19 0643 12/31/19 0629 01/01/20 0545 01/02/20 0557 01/03/20 0705  NA 146* 143 141 141 141  K 5.5* 4.4 4.3 4.1 4.1  CL 110 107 104 103 104  CO2 25 26 26 29 27   GLUCOSE 150* 114* 119* 125* 122*  BUN 30* 20 24* 26* 29*  CREATININE 1.82* 1.51* 1.68* 1.54* 1.60*  CALCIUM 10.0 10.2 10.6* 10.8* 10.9*  MG 2.4 1.8  --   --   --   PHOS 4.6 2.8  --   --   --     GFR: Estimated Creatinine Clearance: 49 mL/min (A) (by C-G formula based on SCr of 1.6 mg/dL (H)).  Liver Function Tests: Recent Labs  Lab 12/30/19 0643 12/31/19 0629 01/01/20 0545 01/03/20 0705  AST 23 19 19 22   ALT 11 12 13 19   ALKPHOS 88 92 90 91  BILITOT 0.6 0.3 0.4 0.3  PROT 5.7* 5.8* 5.7* 5.9*   ALBUMIN 2.6* 2.5* 2.5* 2.5*    CBG: Recent Labs  Lab 01/04/20 1159 01/04/20 1652 01/04/20 2201 01/05/20 0108 01/05/20 0711  GLUCAP 143* 107* 110* 136* 78     Recent Results (from the past 240 hour(s))  Respiratory Panel by RT PCR (Flu A&B, Covid) - Nasopharyngeal Swab     Status: None   Collection Time: 12/20/2019  5:20 PM   Specimen: Nasopharyngeal Swab  Result Value Ref Range Status   SARS Coronavirus 2 by RT PCR  NEGATIVE NEGATIVE Final    Comment: (NOTE) SARS-CoV-2 target nucleic acids are NOT DETECTED.  The SARS-CoV-2 RNA is generally detectable in upper respiratoy specimens during the acute phase of infection. The lowest concentration of SARS-CoV-2 viral copies this assay can detect is 131 copies/mL. A negative result does not preclude SARS-Cov-2 infection and should not be used as the sole basis for treatment or other patient management decisions. A negative result may occur with  improper specimen collection/handling, submission of specimen other than nasopharyngeal swab, presence of viral mutation(s) within the areas targeted by this assay, and inadequate number of viral copies (<131 copies/mL). A negative result must be combined with clinical observations, patient history, and epidemiological information. The expected result is Negative.  Fact Sheet for Patients:  PinkCheek.be  Fact Sheet for Healthcare Providers:  GravelBags.it  This test is no t yet approved or cleared by the [place] FDA and  has been authorized for detection and/or diagnosis of SARS-CoV-2 by FDA under an Emergency Use Authorization (EUA). This EUA will remain  in effect (meaning this test can be used) for the duration of the COVID-19 declaration under Section 564(b)(1) of the Act, 21 U.S.C. section 360bbb-3(b)(1), unless the authorization is terminated or revoked sooner.     Influenza A by PCR NEGATIVE NEGATIVE Final    Influenza B by PCR NEGATIVE NEGATIVE Final    Comment: (NOTE) The Xpert Xpress SARS-CoV-2/FLU/RSV assay is intended as an aid in  the diagnosis of influenza from Nasopharyngeal swab specimens and  should not be used as a sole basis for treatment. Nasal washings and  aspirates are unacceptable for Xpert Xpress SARS-CoV-2/FLU/RSV  testing.  Fact Sheet for Patients: PinkCheek.be  Fact Sheet for Healthcare Providers: GravelBags.it  This test is not yet approved or cleared by the [place] FDA and  has been authorized for detection and/or diagnosis of SARS-CoV-2 by  FDA under an Emergency Use Authorization (EUA). This EUA will remain  in effect (meaning this test can be used) for the duration of the  Covid-19 declaration under Section 564(b)(1) of the Act, 21  U.S.C. section 360bbb-3(b)(1), unless the authorization is  terminated or revoked. Performed at [facility], [place] [address] [place], [place] [address]   Culture, Urine     Status: Abnormal   Collection Time: 12/13/2019  9:45 PM   Specimen: Urine, Random  Result Value Ref Range Status   Specimen Description   Final    URINE, RANDOM Performed at [place] [address], [place], [place] [address]    Special Requests   Final    NONE Performed at [facility], [place] [address], [place], [place] [address]    Culture (A)  Final    <10,000 COLONIES/mL INSIGNIFICANT GROWTH Performed at [facility] [address], [place], [place] [address]    Report Status 12/31/2019 FINAL  Final     Radiology Studies: No results found.   Scheduled Meds: . acetaminophen (TYLENOL) oral liquid 160 mg/5 mL  1,000 mg Oral TID  . atorvastatin  10 mg Oral q1800  . divalproex  1,500 mg Oral QHS  . enoxaparin (LOVENOX) injection  40 mg Subcutaneous Q24H  . feeding supplement  237 mL Oral BID BM  . insulin aspart  0-9  Units Subcutaneous TID WC  . morphine  15 mg Oral Q12H  . pantoprazole  40 mg Oral BID  . QUEtiapine  25 mg Oral QHS  . senna  2  tablet Oral BID  . sucralfate  1 g Oral TID WC & HS  . tamsulosin  0.4 mg Oral Daily   Continuous Infusions:    LOS: 9 days   Time Spent: >54min  Little Ishikawa, DO [facility]   To contact the attending provider between 7A-7P or the covering provider during after hours 7P-7A, please log into the web site [email or web address] and access using universal [place] password for that web site. If you do not have the password, please call the hospital operator.  01/05/2020, 8:11 AM

## 2020-01-05 NOTE — Progress Notes (Signed)
PT Cancellation Note  Patient Details Name: Andrew Warren MRN: [id number] DOB: 1951/01/06   Cancelled Treatment:    Reason Eval/Treat Not Completed: Fatigue/lethargy limiting ability to participate;Pain limiting ability to participate (RN requested PT hold today due to pt's high pain level and lethargy. Will follow.)   Philomena Doheny PT 01/05/2020  Acute Rehabilitation Services Pager [phone] Office [phone]

## 2020-01-05 NOTE — Progress Notes (Signed)
Daily Progress Note   Patient Name: Andrew Warren       Date: 01/05/2020 DOB: Nov 26, 1950  Age: 69 y.o. MRN#: [id number] Attending Physician: Little Ishikawa, MD Primary Care Physician: [name], MD Admit Date: 01/07/2020  Reason for Consultation/Follow-up: Establishing goals of care  Subjective:  complains of pain in his back, given PRN medication recently, son who is in the [place] is visiting with the patient, he is at the bedside, son has discussed with [place] MD regarding the patient's current condition earlier today.   Length of Stay: 9  Current Medications: Scheduled Meds:  . acetaminophen (TYLENOL) oral liquid 160 mg/5 mL  1,000 mg Oral TID  . atorvastatin  10 mg Oral q1800  . divalproex  1,500 mg Oral QHS  . enoxaparin (LOVENOX) injection  40 mg Subcutaneous Q24H  . feeding supplement  237 mL Oral BID BM  . insulin aspart  0-9 Units Subcutaneous TID WC  . morphine  15 mg Oral Q12H  . pantoprazole  40 mg Oral BID  . QUEtiapine  25 mg Oral QHS  . senna  2 tablet Oral BID  . sucralfate  1 g Oral TID WC & HS  . tamsulosin  0.4 mg Oral Daily    Continuous Infusions:   PRN Meds: haloperidol lactate, morphine, morphine injection  Physical Exam         Awakens some, interacts some with his son Regular work of breathing S 1 S 2  Abdomen doe not appear distended Generalized weakness No edema  Vital Signs: BP (!) 90/55 (BP Location: Right Arm)   Pulse 97   Temp 98.2 F (36.8 C) (Oral)   Resp 16   Ht 5\' 8"  (1.727 m)   Wt 93.5 kg   SpO2 96%   BMI 31.34 kg/m  SpO2: SpO2: 96 % O2 Device: O2 Device: Nasal Cannula O2 Flow Rate: O2 Flow Rate (L/min): 2 L/min  Intake/output summary:   Intake/Output Summary (Last 24 hours) at 01/05/2020 1200 Last data  filed at 01/05/2020 1100 Gross per 24 hour  Intake 640 ml  Output 200 ml  Net 440 ml   LBM: Last BM Date: 01/04/20 Baseline Weight: Weight: 92.4 kg Most recent weight: Weight: 93.5 kg       Palliative Assessment/Data:      Patient Active  Problem List   Diagnosis Date Noted  . Esophageal mass   . Abnormal gastrointestinal positron emission tomography (PET) scan   . Palliative care encounter   . Esophageal cancer, stage IV ([place]) 12/26/2019  . Metastatic cancer ([place]) 01/07/2020  . 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 Pain in left shoulder 11/10/2019  . Hesitancy 02/24/2019  . Synovitis of left knee 12/03/2017  . Chronic diarrhea 01/20/2016  . Chest pain 03/26/2014  . Class 2 severe obesity due to excess calories with serious comorbidity and body mass index (BMI) of 36.0 to 36.9 in adult ([place]) 03/26/2014  . Diabetes mellitus ([place])   . Chronic kidney disease   . BPH (benign prostatic hyperplasia)   . High cholesterol   . Pain in the chest   . Bipolar affective disorder ([place]) 06/30/2012  . 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 Diarrhea 12/01/2010  . Adenomatous polyps 12/01/2010    Palliative Care Assessment & Plan   Patient Profile:    Assessment:  69 y.o.malewith past medical history of rheumatoid arthritis, CKD, DM, and bipoloar disorder. He was diagnosed with cancer in early October but was in the process of an outpatient work up and was unaware of the extent of his cancer. He was admitted on11/26/2021with encephalopathy and urinary incontinence.Imaging shows metastasis thru his entire spine encroaching on the S1 nerve root, as well as mets in the liver and thoracoabdominal nodes. EGD done 11/19 showed a large distal esophageal ulcerating mass. Biopsies were taken,likely malignant esophageal tumor was found in distal esophagus Undergoing radiation therapy.   Recommendations/Plan:   continue current pain and non pain  symptom management  Goals of care discussed with son who is at bedside, he remains hopeful that patient's PO intake will improve, that he will have his symptoms controlled, such that he will get to complete his radiation treatments. Overall, son is aware of the serious nature of the patient's illness, offered support and compassionate presence, discussed that PMT remains available to provide goal concordant care. Will follow peripherally and monitor his hospital course and re engage for more discussions regarding a more comfort-focused approach when deemed most appropriate by patient/ family.     Code Status:    Code Status Orders  (From admission, onward)         Start     Ordered   12/17/2019 1920  Do not attempt resuscitation (DNR)  Continuous       Question Answer Comment  In the event of cardiac or respiratory ARREST Do not call a "code blue"   In the event of cardiac or respiratory ARREST Do not perform Intubation, CPR, defibrillation or ACLS   In the event of cardiac or respiratory ARREST Use medication by any route, position, wound care, and other measures to relive pain and suffering. May use oxygen, suction and manual treatment of airway obstruction as needed for comfort.      01/03/2020 1919        Code Status History    Date Active Date Inactive Code Status Order ID Comments User Context   12/27/2019 1738 12/20/2019 1919 Full Code [id number]  Elodia Florence., MD Inpatient   03/26/2014 1407 03/27/2014 1649 Full Code [id number]  Radene Gunning, NP Inpatient   Advance Care Planning Activity    Advance Directive Documentation     Most Recent Value  Type of Advance Directive Healthcare Power of Attorney, Living will  Pre-existing out of facility DNR order (  yellow form or pink MOST form) --  "MOST" Form in Place? --       Prognosis:   Unable to determine  Discharge Planning:  To Be Determined  Care plan was discussed with  Patient, his son who is at the bedside.    Thank you for allowing the Palliative Medicine Team to assist in the care of this patient.   Time In: 10 Time Out: 10.25 Total Time 25 Prolonged Time Billed  no       Greater than 50%  of this time was spent counseling and coordinating care related to the above assessment and plan.  Loistine Chance, MD  Please contact Palliative Medicine Team phone at [phone] for questions and concerns.

## 2020-01-05 NOTE — Progress Notes (Signed)
HEMATOLOGY-ONCOLOGY PROGRESS NOTE  SUBJECTIVE: I spoke with the patient's son at the bedside today.  He had not seen his father for about 2 weeks but notices a significant decline in his status.  The patient did not tolerate radiation earlier this week.  Next radiation scheduled for 11/29.  Pain currently controlled with IV pain medication.  Nursing reports that they were unable to give p.o. medications today.  Oncology History Overview Note  Cancer Staging No matching staging information was found for the patient.    Esophageal cancer, stage IV ([place])  11/24/2019 Tumor Marker   CA 19-9 - 264.18   11/28/2019 Imaging   CT CAP 11/28/19  1. Minimal coronary artery calcifications are noted suggesting  coronary artery disease.  2. 14 mm nodular density is noted in right upper lobe with central  calcification and is most consistent with benign granuloma or  hamartoma. 3 mm nodule is noted anteriorly in the left upper lobe.  Follow-up unenhanced chest CT in 12 months is recommended to ensure  stability and rule out neoplasm.  3. Small scattered calcifications are seen throughout the pancreas  suggesting chronic pancreatitis.  4. Diverticulosis of descending and sigmoid colon is noted without  inflammation.  5. 2.6 cm low density is seen involving anterior portion of midpole  of right kidney most consistent with cyst, but renal ultrasound is  recommended for confirmation and to rule out neoplasm.  Aortic Atherosclerosis (ICD10-I70.0).    12/12/2019 PET scan   PET 12/12/19  IMPRESSION: 1. Widespread metastatic disease, including to bones, liver, thoracoabdominal nodes, left adrenal gland. The primary is presumably an area of distal esophageal wall thickening and hypermetabolism, given the clinical history. 2. Incidental findings, including: Aortic atherosclerosis (ICD10-I70.0), coronary artery atherosclerosis and emphysema (ICD10-J43.9). Pulmonary artery enlargement suggests  pulmonary arterial hypertension.   12/18/2019 Imaging   US Abdomen 12/18/19  1. Multiple hepatic hypoechoic lesions concerning for metastatic  disease. Further characterization with MRI without and with contrast  recommended.  2. Bilateral renal parenchyma atrophy and increased echogenicity,  likely chronic kidney disease. No hydronephrosis or shadowing stone.  3. Echogenic lesion along the posterior mid to lower pole of the  left kidney, indeterminate. Attention on MRI recommended.     12/19/2019 Initial Diagnosis   Esophageal cancer, stage IV ([facility])      REVIEW OF SYSTEMS:   Denies specific complaints today.  I have reviewed the past medical history, past surgical history, social history and family history with the patient and they are unchanged from previous note.   PHYSICAL EXAMINATION: ECOG PERFORMANCE STATUS: 3 - Symptomatic, >50% confined to bed  Vitals:   01/05/20 0104 01/05/20 0503  BP: (!) 94/59 (!) 90/55  Pulse: 100 97  Resp: 14 16  Temp: 98.1 F (36.7 C) 98.2 F (36.8 C)  SpO2: 92% 96%   Filed Weights   01/02/2020 1700 12/28/19 0508  Weight: 92.4 kg 93.5 kg    Intake/Output from previous day: 11/25 0701 - 11/26 0700 In: 41 [P.O.:880] Out: -   GENERAL: Awake and alert, slow to answer questions SKIN: skin color, texture, turgor are normal, no rashes or significant lesions EYES: normal, Conjunctiva are pink and non-injected, sclera clear LUNGS: clear to auscultation and percussion with normal breathing effort HEART: regular rate & rhythm and no murmurs and no lower extremity edema ABDOMEN:abdomen soft, non-tender and normal bowel sounds Musculoskeletal:no cyanosis of digits and no clubbing  NEURO: Alert, oriented to person only  LABORATORY DATA:  I have reviewed the  data as listed CMP Latest Ref Rng & Units 01/03/2020 01/02/2020 01/01/2020  Glucose 70 - 99 mg/dL 122(H) 125(H) 119(H)  BUN 8 - 23 mg/dL 29(H) 26(H) 24(H)  Creatinine 0.61 - 1.24 mg/dL  1.60(H) 1.54(H) 1.68(H)  Sodium 135 - 145 mmol/L 141 141 141  Potassium 3.5 - 5.1 mmol/L 4.1 4.1 4.3  Chloride 98 - 111 mmol/L 104 103 104  CO2 22 - 32 mmol/L _0 Calcium 8.9 - 10.3 mg/dL 10.9(H) 10.8(H) 10.6(H)  Total Protein 6.5 - 8.1 g/dL 5.9(L) - 5.7(L)  Total Bilirubin 0.3 - 1.2 mg/dL 0.3 - 0.4  Alkaline Phos 38 - 126 U/L 91 - 90  AST 15 - 41 U/L 22 - 19  ALT 0 - 44 U/L 19 - 13    Lab Results  Component Value Date   WBC 9.5 01/03/2020   HGB 11.4 (L) 01/03/2020   HCT 37.1 (L) 01/03/2020   MCV 89.4 01/03/2020   PLT 242 01/03/2020   NEUTROABS 8.3 (H) 12/31/2019    MR CERVICAL SPINE WO CONTRAST  Result Date: 01/02/2020 CLINICAL DATA:  Initial evaluation for concern for metastatic disease, incontinence, back pain. EXAM: MRI CERVICAL SPINE WITHOUT CONTRAST TECHNIQUE: Multiplanar, multisequence MR imaging of the cervical spine was performed. No intravenous contrast was administered. COMPARISON:  Prior PET-CT from 12/12/2019 FINDINGS: Alignment: Examination moderately degraded by motion artifact. Straightening of the normal cervical lordosis.  No listhesis. Vertebrae: Scattered osseous metastatic disease seen involving the cervical spine, with definite involvement of the C7, T1, T2, and T3 vertebral bodies. Abnormal signal intensity involving the C5 and C6 vertebral bodies could be related to metastatic disease or possibly reactive degenerative change. Metastatic lesions noted involving the occipital condyles bilaterally. Abnormal edema centered about the right C7-T1 facet likely reflects osseous metastatic disease as well (series 11, image 4). Probable extraosseous extension into the adjacent soft tissues at this level (series 12, image 29) no other definite or convincing extraosseous extension seen on this motion degraded exam. No appreciable epidural involvement. Vertebral body height maintained without pathologic fracture. Cord: Signal intensity within the cervical spinal cord is  grossly within normal limits. No convincing cord signal abnormality on this motion degraded exam. Posterior Fossa, vertebral arteries, paraspinal tissues: Retro cerebellar cyst versus mega cisterna magna noted at the visualized posterior fossa. Craniocervical junction within normal limits. Paraspinous soft tissues otherwise unremarkable. Normal flow voids seen within the vertebral arteries bilaterally. Disc levels: C2-C3: Negative interspace. Moderate right with mild left facet degeneration. No significant canal or foraminal stenosis. C3-C4: Minimal disc bulge. Mild to moderate right with mild left facet hypertrophy. No significant spinal stenosis. Foramina remain patent. C4-C5: Shallow central disc osteophyte complex indents the ventral thecal sac. Mild spinal stenosis without significant cord deformity. Foramina remain patent. C5-C6: Broad-based posterior disc osteophyte complex flattens and indents the ventral thecal sac, slightly eccentric to the left. Resultant mild spinal stenosis with mild flattening of the left hemi cord. No definite cord signal changes. Superimposed right greater than left uncovertebral hypertrophy with no more than mild bilateral C6 foraminal narrowing. C6-C7: Degenerative intervertebral disc space narrowing with diffuse disc osteophyte. Mild flattening of the ventral thecal sac without significant spinal stenosis. Right greater than left uncovertebral spurring with resultant moderate right C7 foraminal stenosis. No significant left foraminal narrowing. C7-T1: Minimal disc bulge. Osseous metastatic disease involves the right C7-T1 facet. Probable involvement of the adjacent right T1 transverse process and posterior right first rib (series 12, images 29, 30). No significant epidural extension  at this time. Spinal canal remains patent. Neural foramina remain grossly patent as well. IMPRESSION: 1. Scattered osseous metastatic disease involving the cervical spine as above. No significant  epidural extension at this time. No pathologic fracture or other complication. 2. Metastatic deposit centered about the right C7-T1 facet with associated extraosseous extension into the surrounding soft tissues as above. No other significant extra osseous disease identified on this motion degraded exam. 3. Moderate multilevel cervical spondylosis as above, with resultant mild spinal stenosis at C4-5 through C6-7. Moderate right C7 foraminal narrowing related to disc osteophyte and uncovertebral disease. Electronically Signed   By: Jeannine Boga M.D.   On: 12/28/2019 21:59   MR THORACIC SPINE WO CONTRAST  Result Date: 01/06/2020 CLINICAL DATA:  Metastatic esophageal cancer EXAM: MRI THORACIC SPINE WITHOUT CONTRAST TECHNIQUE: Multiplanar, multisequence MR imaging of the thoracic spine was performed. No intravenous contrast was administered. COMPARISON:  None. FINDINGS: Alignment:  Anteroposterior alignment is maintained. Vertebrae: STIR hyperintense lesions throughout the thoracic spine reflecting metastatic disease with involvement of posterior elements. No acute compression deformity. No significant epidural extension. Cord:  Normal caliber and signal. Paraspinal and other soft tissues: Unremarkable. Disc levels: Mild multilevel degenerative disc disease and facet arthropathy. There is no high-grade degenerative stenosis. IMPRESSION: Diffuse osseous metastatic disease. No acute compression deformity or significant epidural extension. Electronically Signed   By: Macy Mis M.D.   On: 12/30/2019 14:37   MR LUMBAR SPINE WO CONTRAST  Result Date: 01/03/2020 CLINICAL DATA:  Initial evaluation for back pain, incontinence, metastatic disease evaluation. EXAM: MRI LUMBAR SPINE WITHOUT CONTRAST TECHNIQUE: Multiplanar, multisequence MR imaging of the lumbar spine was performed. No intravenous contrast was administered. COMPARISON:  Prior PET-CT from 12/12/2019 FINDINGS: Segmentation: Standard. Lowest  well-formed disc space labeled the L5-S1 level. Alignment: 2 mm retrolisthesis of L2 on L3. Alignment otherwise normal with preservation of the normal lumbar lordosis. Vertebrae: Extensive diffuse osseous metastases seen throughout the visualized thoracolumbar spine as well as the sacrum and pelvis. There is involvement of the centrally all levels. For reference purposes, largest of these lesions involves the left anterior aspect of the L2 vertebral body and measures approximately 3.2 cm in greatest dimension (series 11, image 11). Vertebral body height maintained without pathologic fracture. There is early extraosseous extension at the level of the sacrum/S1 segment, with tumor encroaching upon the descending S1 nerve roots bilaterally, right greater than left (series 13, image 34). Slight mass effect on the descending right S1 nerve root which is somewhat flattened and displaced posteriorly. No overt neural impingement. No other significant extra osseous extension of tumor seen elsewhere within the lumbar spine at this time. No other significant epidural extension. Conus medullaris and cauda equina: Conus extends to the T12-L1 level. Conus and cauda equina appear normal. No evidence for compression of the distal cord or nerve roots of the cauda equina. Paraspinal and other soft tissues: Left periaortic/retroperitoneal lymph nodes measuring up to 9 mm noted (series 12, image 5), nonspecific, but could reflect nodal metastases. Atherosclerotic change noted within the visualized aorta. Multiple scattered cystic lesions noted about the kidneys, several of which demonstrate intrinsic T1 hyperintensity within the right kidney (series 13, image 10). Findings are indeterminate, and could reflect proteinaceous and/or hemorrhagic cyst. These are not well assessed on this noncontrast examination. Paraspinous soft tissues demonstrate no other acute finding. Disc levels: T12-L1: Mild diffuse disc bulge with disc desiccation.  Chronic endplate Schmorl's node deformities. No significant spinal stenosis. Foramina remain patent. L1-2: Mild disc bulge with  disc desiccation. Chronic endplate Schmorl's node deformity. Mild facet hypertrophy. No spinal stenosis. Foramina remain patent. L2-3: Trace retrolisthesis. Degenerative intervertebral disc space narrowing with mild diffuse disc bulge and disc desiccation. Mild left greater than right facet hypertrophy. Resultant mild narrowing of the lateral recesses bilaterally, left greater than right. Central canal remains patent. No significant foraminal encroachment. L3-4: Disc desiccation with mild disc bulge. Central annular fissure. Mild bilateral facet hypertrophy. Resultant mild lateral recess narrowing bilaterally. Central canal remains patent. No significant foraminal encroachment. L4-5: Disc desiccation with mild diffuse disc bulge. Central annular fissure. Mild facet and ligament flavum hypertrophy. Resultant mild narrowing of the right lateral recess. Foramina remain patent. L5-S1: Disc desiccation without significant disc bulge. Severe right with moderate left facet degeneration. Mild epidural lipomatosis. No significant canal or foraminal stenosis. IMPRESSION: 1. Extensive diffuse osseous metastases throughout the visualized thoracolumbar spine and pelvis. No pathologic fracture. 2. Early extraosseous extension of tumor at the level of the sacrum/S1 segment, with tumor encroaching upon the descending S1 nerve roots bilaterally, right greater than left. Slight mass effect on the descending right S1 nerve root without overt neural impingement. 3. No other significant extra osseous extension or epidural tumor elsewhere within the lumbar spine. No cord or cauda equina compression. 4. Left periaortic/retroperitoneal lymph nodes measuring up to 9 mm, nonspecific, but could reflect nodal metastases. 5. Mild multilevel degenerative spondylosis with resultant mild lateral recess narrowing  bilaterally at L2-3 and L3-4, and on the right at L4-5. Electronically Signed   By: Jeannine Boga M.D.   On: 01/06/2020 21:46   NM PET Image Initial (PI) Skull Base To Thigh  Result Date: 12/12/2019 CLINICAL DATA:  Initial treatment strategy for neoplasm of esophagus. Bone metastasis. COVID vaccine in left arm 5/21 EXAM: NUCLEAR MEDICINE PET SKULL BASE TO THIGH TECHNIQUE: 11.1 mCi F-18 FDG was injected intravenously. Full-ring PET imaging was performed from the skull base to thigh after the radiotracer. CT data was obtained and used for attenuation correction and anatomic localization. Fasting blood glucose: 89 mg/dl COMPARISON:  11/20/2019 chest abdomen and pelvic CTs. FINDINGS: Mediastinal blood pool activity: SUV max 3.3 Liver activity: SUV max NA NECK: Laryngeal hypermetabolism is favored to be physiologic, without well-defined CT correlate. No cervical nodal hypermetabolism. Incidental CT findings: No cervical adenopathy. CHEST: Small hypermetabolic mediastinal nodes, including a prevascular node of 7 mm and a S.U.V. max of 12.4 on 63/4. Distal esophageal hypermetabolism with mild wall thickening, including at a S.U.V. max of 18.6 on 97/4. Incidental CT findings: Aortic and coronary artery atherosclerosis. Pulmonary artery enlargement, outflow tract 3.3 cm. Centrilobular and paraseptal emphysema. Central right upper lobe 1.3 cm pulmonary nodule is again favored to represent a hamartoma or granuloma, given central calcification. Not hypermetabolic. ABDOMEN/PELVIS: Bilateral hepatic metastasis which are relatively CT occult. Example in the anterior left hepatic lobe at a S.U.V. max of 13.3 and within the central high right hepatic lobe at a S.U.V. max of 12.9. Abdominal nodal metastasis, with index portacaval node measuring 1.1 cm and a S.U.V. max of 15.9 on 112/4. Multifocal right-sided colonic hypermetabolism is favored to be physiologic. Left adrenal hypermetabolic nodule measures 1.1 cm and a  S.U.V. max of 15.0 on 108/4 Incidental CT findings: Extensive colonic diverticulosis. Cholecystectomy. Abdominal aortic atherosclerosis. Nonaneurysmal dilatation at 2.8 cm. SKELETON: Widespread osseous metastasis. The previously described left scapular/coracoid lesion measures 4.5 cm and a S.U.V. max of 21.0 on 37/4. A sacral lytic lesion measures a S.U.V. max of 16.7. Incidental CT findings: none IMPRESSION: 1.  Widespread metastatic disease, including to bones, liver, thoracoabdominal nodes, left adrenal gland. The primary is presumably an area of distal esophageal wall thickening and hypermetabolism, given the clinical history. 2. Incidental findings, including: Aortic atherosclerosis (ICD10-I70.0), coronary artery atherosclerosis and emphysema (ICD10-J43.9). Pulmonary artery enlargement suggests pulmonary arterial hypertension. Electronically Signed   By: Abigail Miyamoto M.D.   On: 12/12/2019 09:20   DG CHEST PORT 1 VIEW  Result Date: 12/17/2019 CLINICAL DATA:  Metastatic cancer EXAM: PORTABLE CHEST 1 VIEW COMPARISON:  03/26/2014 FINDINGS: Calcified granuloma in the right mid lung. Heart is normal size. No confluent opacities or effusions. No acute bony abnormality. IMPRESSION: No active disease. Electronically Signed   By: Rolm Baptise M.D.   On: 12/22/2019 20:25    ASSESSMENT AND PLAN: 1.  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  Acute metabolic encephalopathy 3.  Mild anemia 4.  AKI 5.  Hypercalcemia 6.  Pain secondary to bone metastasis 7.  Bipolar disorder 8.  Diabetes mellitus 9.  Rheumatoid arthritis  -Discussed diagnosis, prognosis, and treatment options with the patient son today.  We discussed the incurable nature of his metastatic esophageal cancer and that treatment would be palliative.  We discussed that he is not a candidate for intensive systemic chemotherapy due to poor performance status.  HER-2 was  negative so Herceptin is not a treatment option.  Awaiting MSI and PD-L1 testing to determine if immunotherapy as a treatment option. -We further discussed that if immunotherapy is a treatment option, it would take a minimum of a month to see a treatment response.  Additionally, immunotherapy would need to be given at the cancer center and not on an inpatient basis.  This would require that the patient can get to and from the cancer center for treatment.  I am concerned that in his current state, this may not be feasible.  The patient's son states understanding and wants to see if his father gets stronger to be able to consider systemic treatment. -Recommend continuation of palliative radiation if the patient is able to tolerate. -Appreciate assistance of hospitalist and palliative care team for ongoing goals of care discussion. -Recommend ongoing monitoring of calcium and renal function.  The patient has hypercalcemia related to malignancy.  Consider for of her medronate if corrected calcium is above 12.  Mikey Bussing  01/05/2020

## 2020-01-06 DIAGNOSIS — Z515 Encounter for palliative care: Secondary | ICD-10-CM | POA: Diagnosis not present

## 2020-01-06 DIAGNOSIS — Z7189 Other specified counseling: Secondary | ICD-10-CM | POA: Diagnosis not present

## 2020-01-06 LAB — GLUCOSE, CAPILLARY
Glucose-Capillary: 93 mg/dL (ref 70–99)
Glucose-Capillary: 129 mg/dL — ABNORMAL HIGH (ref 70–99)
Glucose-Capillary: 80 mg/dL (ref 70–99)

## 2020-01-06 NOTE — Progress Notes (Signed)
Patient's son called out. Patient agitated and pulling at gown and lines.  Repositioned and redirected patient Gave PRN meds, see MAR   Will continue to montior

## 2020-01-06 NOTE — Progress Notes (Signed)
Daily Progress Note   Patient Name: Andrew Warren       Date: 01/06/2020 DOB: 03/26/1950  Age: 69 y.o. MRN#: [id number] Attending Physician: Little Ishikawa, MD Primary Care Physician: [name], MD Admit Date: 12/17/2019  Reason for Consultation/Follow-up: Establishing goals of care  Subjective:  patient is awake alert, son at bedside, we talked about his current pain medication regimen, also discussed about next steps, disposition options.    Length of Stay: 10  Current Medications: Scheduled Meds:   acetaminophen (TYLENOL) oral liquid 160 mg/5 mL  1,000 mg Oral TID   atorvastatin  10 mg Oral q1800   divalproex  1,500 mg Oral QHS   enoxaparin (LOVENOX) injection  40 mg Subcutaneous Q24H   feeding supplement  237 mL Oral BID BM   insulin aspart  0-9 Units Subcutaneous TID WC   morphine  15 mg Oral Q12H   pantoprazole  40 mg Oral BID   QUEtiapine  25 mg Oral QHS   senna  2 tablet Oral BID   sucralfate  1 g Oral TID WC & HS   tamsulosin  0.4 mg Oral Daily    Continuous Infusions:   PRN Meds: haloperidol lactate, morphine, morphine injection  Physical Exam         Awakens some, interacts some with his son Regular work of breathing S 1 S 2  Abdomen does not appear distended Generalized weakness No edema  Vital Signs: BP (!) 93/53 (BP Location: Right Arm)    Pulse 94    Temp 97.9 F (36.6 C) (Oral)    Resp 20    Ht 5\' 8"  (1.727 m)    Wt 93.5 kg    SpO2 99%    BMI 31.34 kg/m  SpO2: SpO2: 99 % O2 Device: O2 Device: Nasal Cannula O2 Flow Rate: O2 Flow Rate (L/min): 2 L/min  Intake/output summary:   Intake/Output Summary (Last 24 hours) at 01/06/2020 1239 Last data filed at 01/06/2020 6226 Gross per 24 hour  Intake 120 ml  Output 1150 ml    Net -1030 ml   LBM: Last BM Date: 01/04/20 Baseline Weight: Weight: 92.4 kg Most recent weight: Weight: 93.5 kg       Palliative Assessment/Data:    Flowsheet Rows     Most Recent Value  Intake Tab  Referral Department Oncology  Unit at Time of Referral Oncology Unit  Palliative Care Primary Diagnosis Cancer  Date Notified 01/07/2020  Palliative Care Type New Palliative care  Reason for referral Clarify Goals of Care, Pain  Date of Admission 01/04/2020  Date first seen by Palliative Care 01/06/2020  # of days Palliative referral response time 0 Day(s)  # of days IP prior to Palliative referral 2  Clinical Assessment  Psychosocial & Spiritual Assessment  Palliative Care Outcomes      Patient Active Problem List   Diagnosis Date Noted   Goals of care, counseling/discussion    Esophageal mass    Abnormal gastrointestinal positron emission tomography (PET) scan    Palliative care encounter    Esophageal cancer, stage IV ([place]) 12/30/2019   Metastatic cancer ([place]) 01/01/2020   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 ([place]) 11/16/2019   Pain in left shoulder 11/10/2019   Hesitancy 02/24/2019   Synovitis of left knee 12/03/2017   Chronic diarrhea 01/20/2016   Chest pain 03/26/2014   Class 2 severe obesity due to excess calories with serious comorbidity and body mass index (BMI) of 36.0 to 36.9 in adult ([place]) 03/26/2014   Diabetes mellitus ([facility])    Chronic kidney disease    BPH (benign prostatic hyperplasia)    High cholesterol    Pain in the chest    Bipolar affective disorder ([place]) 06/30/2012   Insomnia due to mental disorder 06/30/2012   Diarrhea 12/01/2010   Adenomatous polyps 12/01/2010    Palliative Care Assessment & Plan   Patient Profile:    Assessment:  68 y.o.malewith past medical history of rheumatoid arthritis, CKD, DM, and bipoloar disorder. He was diagnosed with cancer in early October but was in the process of an  outpatient work up and was unaware of the extent of his cancer. He was admitted on11/17/2021with encephalopathy and urinary incontinence.Imaging shows metastasis thru his entire spine encroaching on the S1 nerve root, as well as mets in the liver and thoracoabdominal nodes. EGD done 11/19 showed a large distal esophageal ulcerating mass. Biopsies were taken,likely malignant esophageal tumor was found in distal esophagus Undergoing radiation therapy.   Recommendations/Plan:   continue current pain and non pain symptom management: patient on PO morphine, IV Morphine PRN and also on Tylenol.  Goals of care discussed with son who is at bedside, he remains hopeful that patient's PO intake will improve, that he will have his symptoms controlled, such that he will get to complete his radiation treatments. Today asks about what home with hospice would look like, we talked about hospice philosophy of care in some detail.    Code Status:    Code Status Orders  (From admission, onward)         Start     Ordered   12/12/2019 1920  Do not attempt resuscitation (DNR)  Continuous       Question Answer Comment  In the event of cardiac or respiratory ARREST Do not call a code blue   In the event of cardiac or respiratory ARREST Do not perform Intubation, CPR, defibrillation or ACLS   In the event of cardiac or respiratory ARREST Use medication by any route, position, wound care, and other measures to relive pain and suffering. May use oxygen, suction and manual treatment of airway obstruction as needed for comfort.      12/22/2019 1919        Code Status History    Date Active Date Inactive Code Status  Order ID Comments User Context   12/26/2019 1738 12/30/2019 1919 Full Code [id number]  Elodia Florence., MD Inpatient   03/26/2014 1407 03/27/2014 1649 Full Code [id number]  Radene Gunning, NP Inpatient   Advance Care Planning Activity    Advance Directive Documentation     Most Recent Value    Type of Advance Directive Healthcare Power of Attorney, Living will  Pre-existing out of facility DNR order (yellow form or pink MOST form) --  "MOST" Form in Place? --       Prognosis:   Unable to determine  Discharge Planning:  To Be Determined  Care plan was discussed with  Patient, his son who is at the bedside, [facility] MD.   Thank you for allowing the Palliative Medicine Team to assist in the care of this patient.   Time In: 10 Time Out: 10.25 Total Time 25 Prolonged Time Billed  no       Greater than 50%  of this time was spent counseling and coordinating care related to the above assessment and plan.  Loistine Chance, MD  Please contact Palliative Medicine Team phone at [phone] for questions and concerns.

## 2020-01-06 NOTE — Progress Notes (Signed)
PROGRESS NOTE    Andrew Warren  FUX:[id number] DOB: [birth year] DOA: 01/09/2020 PCP: Andrew Percy, MD   No chief complaint on file.  Brief Narrative:  Andrew Warren is a 69 y.o. male with medical history significant of bipolar disorder, HTN, HLD, and metastatic cancer who presents to the hospital after a clinic visit. Per oncology note, he was seen by orthopedics outpatient who diagnosed cancer via MRI and he was referred to oncology in early October 10/21.  He has not had any definitive diagnosis with biopsy yet.  He's taking morphine for pain in his back, chest and upper abdomen.  He's noticed recent urinary frequency and incontinence over the past 2 days.  He hasn't had a BM in weeks.  He's slower to speak and respond since heing on morphine.  His wife helps him ambulate, use the bathroom over the past week.  Seems like he's been weaker over the apst several weeks.  He notes he's constipated and has issues urinating.  C/o numbness to groin.  He's unable to clearly describe timing.  He complains of pain, but isn't able to describe this well.  No smoking or etoh per his report. Patient underwent MRI of his spine.  Also underwent EGD with biopsy.  Oncology has been following.  Plan is for and if radiation treatments.  Disposition is unclear at this time given apparently worsening condition over the past 48 hours with worsening mental status, unclear if patient will be able to continue to tolerate palliative radiation much less immunotherapy which was discussed previously by oncology team.  Palliative care continues to be involved, discussion with patient's son Andrew Warren and wife Andrew Warren ongoing while we continue to keep the patient comfortable while attempting to complete palliative radiation.  Assessment & Plan:    Goals of care discussion  Lengthy discussion daily with family, hospice and oncology continue to follow along as well.  Family understands that patient will likely progress to hospice,  the question will ultimately be waived we will transition to full hospice and forego any further palliative treatment.  Family reasonably requesting to follow with radiation oncology on Monday, we discussed that if patient is able to continue to tolerate palliative radiation we will continue to support this decision; however, if patient cannot tolerate radiation would strongly suggest further discussion and advancement towards hospice either at home or facility.  Saddle Anesthesia/Concern for Cord Compression in setting of metastatic disease:  MRI c spine with scattered osseous metastatic dz involving the cervical spine (see report) MRI with extensive diffuse osseous mets throughout visualized thoracolumbar spine and pelvis.  Early extraosseous extension of tumor at level of the sacrum /S1 segment with tumor encroaching upon the descending S1 nerve roots bilaterally, R > L.  Slight mass effect on the descending R S1 nerve root without overt neural impingement.  No cord or cauda equina compression.  (see report)  thoracic MRI with diffuse osseous metastatic disease MRI brain canceled due to inability to tolerate  Per Dr. [name] no compressive lesion in lumbar region or conus.  No surgical intervention was recommended. UA not c/w UTI. Seems to be stable from a neurological standpoint.  Poorly differentiated adenocarcinoma with metastases Patient seen by oncology.   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   Plan is for ongoing palliative radiation treatment: dose 2 of 10 completed yesterday PET  12/12/19 with widespread metastatic dz including to bones, liver, thoracoabdominal nodes, L adrenal gland.   Palliative care c/s -> DNR, appreciate palliative recs Esophageal mass biopsy came back as poorly differentiated adenocarcinoma.  Oncology has  discussed with patient and family.  Plan is to continue with palliative radiation.  Further testing has been ordered to see if he is a candidate for immunotherapy or targeted therapy.  Patient not a candidate for intensive chemotherapy. May also be reasonable to consider palliative care along with hospice.  All these issues were discussed with family and care team.  Acute Metabolic versus toxic encephalopathy, ongoing Likely in the setting of metastatic disease and increasing narcotics Patient has notable bipolar disorder however this appears to be more metabolic versus toxic given above.  Diabetes mellitus type 2 SSI. Holding glipizide.  HbA1c 5.9.  Hyperkalemia Hold off on further lab work pending further goals of care discussion.  Hypernatremia Hold off on further lab work pending further goals of care discussion  CKDIIIb Hold off on further lab work pending further goals of care discussion  Hypercalcemia of malignancy Hold off on further lab work pending further goals of care discussion, consider IV fluids if not tolerating p.o. safely in the next 12 to 24 hours  Bipolar disorder On depakote, seroquel at home.  Continue as able to tolerate p.o. safely.  Hyperlipidemia Continue Lipitor  Normocytic anemia Hold off on further lab work pending further goals of care discussion.  Essential hypertension/sinus tachycardia Tachycardia and hypertensive events likely in setting of pain, continue pain control given goals of care discussion as above  History of BPH Continue Flomax.    Likely peripheral artery disease Noted to have colder right foot compared to the left, and colder feet than hands - adequate capillary refill.  DVT prophylaxis: Lovenox Code Status: DNR Family Communication: Discussed at length at bedside with family about prognosis, current goals of care and treatment options Disposition: SNF was recommended by PT and OT - family requesting HHPT if stable for home  after completion of radiation  Status is: Inpatient  Remains inpatient appropriate because:Inpatient level of care appropriate due to severity of illness   Dispo: The patient is from: Home              Anticipated d/c is to: pending likely home given previous family discussion.  At this point home hospice would likely be most reasonable, awaiting family meeting on January 05, 2020.              Anticipated d/c date is: > 3 days              Patient currently is not medically stable to d/c.  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 Biopsied. - Gastritis. Biopsied. - Erythematous duodenopathy. Biopsied. - Normal second portion of the duodenum. Recommendations Soft diet. - Continue present medications. - Await pathology results. - Follow up with Oncology - Use Protonix (pantoprazole) 40 mg PO BID. - Use sucralfate suspension 1 gram PO QID. - Called his wife and discussed endoscopic findings - GI is available if needed, please call with any questions  Antimicrobials:  Anti-infectives (From admission, onward)   None      Subjective: Patient markedly somnolent this morning, poorly interactive, answers single word sentence yes/no but otherwise not interactive, tolerating p.o. poorly per wife at bedside and otherwise not participating in discussion today.  Objective:  Vitals:   01/05/20 1823 01/05/20 2157 01/06/20 0221 01/06/20 0454  BP: (!) 91/52 106/66 (!) 85/57 (!) 93/53  Pulse: (!) 111 (!) 104 91 94  Resp: 18 20 16 20   Temp: 98.9 F (37.2 C) 98.5 F (36.9 C) 97.7 F (36.5 C) 97.9 F (36.6 C)  TempSrc:  Oral Oral Oral  SpO2: 95% 98% 100% 99%  Weight:      Height:        Intake/Output Summary (Last 24 hours) at 01/06/2020 0817 Last data filed at 01/06/2020 4696 Gross per 24 hour  Intake --  Output 1350 ml  Net -1350 ml   Filed Weights   01/05/2020 1700 12/28/19  0508  Weight: 92.4 kg 93.5 kg    Examination:  General appearance: Somnolent, poorly arousable, no acute distress Resp: Clear to auscultation bilaterally.  Normal effort Cardio: S1-S2 is normal regular.  No S3-S4.  No rubs murmurs or bruit GI: Abdomen is soft.  Noted to be mildly tender in the right upper quadrant without any rebound rigidity or guarding.  No masses organomegaly.   Extremities: No edema.  BLE cool to touch Neurologic: Remains somnolent/poorly interactive.  No focal neurological deficits.   Data Reviewed: I have personally reviewed following labs and imaging studies  CBC: Recent Labs  Lab 12/31/19 0629 01/01/20 0545 01/03/20 0705  WBC 11.8* 12.3* 9.5  NEUTROABS 8.3*  --   --   HGB 11.5* 11.4* 11.4*  HCT 37.9* 38.2* 37.1*  MCV 89.2 90.3 89.4  PLT 199 189 295    Basic Metabolic Panel: Recent Labs  Lab 12/31/19 0629 01/01/20 0545 01/02/20 0557 01/03/20 0705  NA 143 141 141 141  K 4.4 4.3 4.1 4.1  CL 107 104 103 104  CO2 26 26 29 27   GLUCOSE 114* 119* 125* 122*  BUN 20 24* 26* 29*  CREATININE 1.51* 1.68* 1.54* 1.60*  CALCIUM 10.2 10.6* 10.8* 10.9*  MG 1.8  --   --   --   PHOS 2.8  --   --   --     GFR: Estimated Creatinine Clearance: 49 mL/min (A) (by C-G formula based on SCr of 1.6 mg/dL (H)).  Liver Function Tests: Recent Labs  Lab 12/31/19 0629 01/01/20 0545 01/03/20 0705  AST 19 19 22   ALT 12 13 19   ALKPHOS 92 90 91  BILITOT 0.3 0.4 0.3  PROT 5.8* 5.7* 5.9*  ALBUMIN 2.5* 2.5* 2.5*    CBG: Recent Labs  Lab 01/05/20 0711 01/05/20 1135 01/05/20 1651 01/05/20 2155 01/06/20 0758  GLUCAP 78 91 113* 165* 93     Recent Results (from the past 240 hour(s))  Respiratory Panel by RT PCR (Flu A&B, Covid) - Nasopharyngeal Swab     Status: None   Collection Time: 12/11/2019  5:20 PM   Specimen: Nasopharyngeal Swab  Result Value Ref Range Status   SARS Coronavirus 2 by RT PCR NEGATIVE NEGATIVE Final    Comment: (NOTE) SARS-CoV-2 target  nucleic acids are NOT DETECTED.  The SARS-CoV-2 RNA is generally detectable in upper respiratoy specimens during the acute phase of infection. The lowest concentration of SARS-CoV-2 viral copies this assay can detect is 131 copies/mL. A negative result does not preclude SARS-Cov-2 infection and should not be used as the sole basis for treatment or other patient management decisions. A negative result may occur with  improper specimen collection/handling, submission of specimen other than nasopharyngeal swab, presence of viral mutation(s) within the areas targeted by this assay, and inadequate number  of viral copies (<131 copies/mL). A negative result must be combined with clinical observations, patient history, and epidemiological information. The expected result is Negative.  Fact Sheet for Patients:  PinkCheek.be  Fact Sheet for Healthcare Providers:  GravelBags.it  This test is no t yet approved or cleared by the [place] FDA and  has been authorized for detection and/or diagnosis of SARS-CoV-2 by FDA under an Emergency Use Authorization (EUA). This EUA will remain  in effect (meaning this test can be used) for the duration of the COVID-19 declaration under Section 564(b)(1) of the Act, 21 U.S.C. section 360bbb-3(b)(1), unless the authorization is terminated or revoked sooner.     Influenza A by PCR NEGATIVE NEGATIVE Final   Influenza B by PCR NEGATIVE NEGATIVE Final    Comment: (NOTE) The Xpert Xpress SARS-CoV-2/FLU/RSV assay is intended as an aid in  the diagnosis of influenza from Nasopharyngeal swab specimens and  should not be used as a sole basis for treatment. Nasal washings and  aspirates are unacceptable for Xpert Xpress SARS-CoV-2/FLU/RSV  testing.  Fact Sheet for Patients: PinkCheek.be  Fact Sheet for Healthcare  Providers: GravelBags.it  This test is not yet approved or cleared by the [place] FDA and  has been authorized for detection and/or diagnosis of SARS-CoV-2 by  FDA under an Emergency Use Authorization (EUA). This EUA will remain  in effect (meaning this test can be used) for the duration of the  Covid-19 declaration under Section 564(b)(1) of the Act, 21  U.S.C. section 360bbb-3(b)(1), unless the authorization is  terminated or revoked. Performed at [facility], [place] [address], [place], [place] [address]   Culture, Urine     Status: Abnormal   Collection Time: 12/20/2019  9:45 PM   Specimen: Urine, Random  Result Value Ref Range Status   Specimen Description   Final    URINE, RANDOM Performed at [facility] [address] [place], [place] [address]    Special Requests   Final    NONE Performed at [facility], [place] [address], [place], [place] [address]    Culture (A)  Final    <10,000 COLONIES/mL INSIGNIFICANT GROWTH Performed at [facility] [address], [place], [place] [address]    Report Status 12/31/2019 FINAL  Final     Radiology Studies: No results found.   Scheduled Meds: . acetaminophen (TYLENOL) oral liquid 160 mg/5 mL  1,000 mg Oral TID  . atorvastatin  10 mg Oral q1800  . divalproex  1,500 mg Oral QHS  . enoxaparin (LOVENOX) injection  40 mg Subcutaneous Q24H  . feeding supplement  237 mL Oral BID BM  . insulin aspart  0-9 Units Subcutaneous TID WC  . morphine  15 mg Oral Q12H  . pantoprazole  40 mg Oral BID  . QUEtiapine  25 mg Oral QHS  . senna  2 tablet Oral BID  . sucralfate  1 g Oral TID WC & HS  . tamsulosin  0.4 mg Oral Daily   Continuous Infusions:    LOS: 10 days   Time Spent: >33min  Little Ishikawa, DO [facility]   To contact the attending provider between 7A-7P or the covering provider during after hours 7P-7A, please  log into the web site [email or web address] and access using universal [place] password for that web site. If you do not have the password, please call the hospital operator.  01/06/2020, 8:17 AM

## 2020-01-07 DIAGNOSIS — C7951 Secondary malignant neoplasm of bone: Secondary | ICD-10-CM | POA: Diagnosis not present

## 2020-01-07 DIAGNOSIS — Z7189 Other specified counseling: Secondary | ICD-10-CM | POA: Diagnosis not present

## 2020-01-07 DIAGNOSIS — Z515 Encounter for palliative care: Secondary | ICD-10-CM | POA: Diagnosis not present

## 2020-01-07 LAB — GLUCOSE, CAPILLARY
Glucose-Capillary: 110 mg/dL — ABNORMAL HIGH (ref 70–99)
Glucose-Capillary: 144 mg/dL — ABNORMAL HIGH (ref 70–99)
Glucose-Capillary: 277 mg/dL — ABNORMAL HIGH (ref 70–99)
Glucose-Capillary: 102 mg/dL — ABNORMAL HIGH (ref 70–99)

## 2020-01-07 MED ORDER — MORPHINE SULFATE (PF) 4 MG/ML IV SOLN
4.0000 mg | INTRAVENOUS | Status: DC | PRN
  Administered 2020-01-07 – 2020-01-08 (×2): 4 mg via INTRAVENOUS
  Filled 2020-01-07 (×2): qty 1

## 2020-01-07 MED ORDER — LACTATED RINGERS IV BOLUS
500.0000 mL | Freq: Once | INTRAVENOUS | Status: AC
  Administered 2020-01-07: 500 mL via INTRAVENOUS

## 2020-01-07 MED ORDER — CHLORHEXIDINE GLUCONATE CLOTH 2 % EX PADS
6.0000 | MEDICATED_PAD | Freq: Every day | CUTANEOUS | Status: DC
  Administered 2020-01-08: 6 via TOPICAL

## 2020-01-07 MED ORDER — DIGOXIN 0.25 MG/ML IJ SOLN
0.2500 mg | Freq: Every day | INTRAMUSCULAR | Status: AC
  Administered 2020-01-08: 0.25 mg via INTRAVENOUS
  Filled 2020-01-07: qty 1
  Filled 2020-01-07: qty 2

## 2020-01-07 MED ORDER — ORAL CARE MOUTH RINSE
15.0000 mL | Freq: Two times a day (BID) | OROMUCOSAL | Status: DC
  Administered 2020-01-08 (×2): 15 mL via OROMUCOSAL

## 2020-01-07 MED ORDER — KETOROLAC TROMETHAMINE 15 MG/ML IJ SOLN
15.0000 mg | Freq: Once | INTRAMUSCULAR | Status: AC
  Administered 2020-01-07: 15 mg via INTRAVENOUS
  Filled 2020-01-07: qty 1

## 2020-01-07 MED ORDER — LORAZEPAM 2 MG/ML IJ SOLN
1.0000 mg | INTRAMUSCULAR | Status: DC | PRN
  Administered 2020-01-08: 2 mg via INTRAVENOUS
  Filled 2020-01-07: qty 1

## 2020-01-07 NOTE — Significant Event (Addendum)
Rapid Response Event Note   Reason for Call : Increased temperature, heart rate and MEWS score  Initial Focused Assessment: Patient is a DNR and in the process of being made palliative - comfort care status.  Patient did look uncomfortable and clammy.  He is warm to the touch in his core.  Interventions: Unable to obtain a rectal temperature since he has a flex-I-sel in place.  12 lead EKG and new VS obtained.  MD called for new orders.  Plan of Care: NS bolus and toradol given.  Ice packs placed.  Will continue to monitor and recheck VS within 30 minutes by primary RN.  Event Summary: Family potential being called if status worsens.  After arrival of MD to floor patient being transferred to ICU 1235 due to closer monitoring and higher acuity for regular floor.  MD Notified:  Charge RN Call Time: 2305 Arrival Time: 2310 End Time: 2326  Ethelene Hal, RN

## 2020-01-07 NOTE — Progress Notes (Signed)
Flexiseal placed without difficulty, patient tolerated well, draining to bag. Will continue to monitor.

## 2020-01-07 NOTE — Progress Notes (Signed)
Patient vitals are unstable, on call provider was notified Lang Snow, FNP. Per Np's orders are followed.  RRT was called as well.  Will continue to monitor him.

## 2020-01-07 NOTE — Progress Notes (Signed)
PROGRESS NOTE    Andrew Warren  EHM:[id number] DOB: 25-Apr-1950 DOA: 01/06/2020 PCP: Andrew Percy, MD   No chief complaint on file.  Brief Narrative:  Andrew Warren is a 69 y.o. male with medical history significant of bipolar disorder, HTN, HLD, and metastatic cancer who presents to the hospital after a clinic visit. Per oncology note, he was seen by orthopedics outpatient who diagnosed cancer via MRI and he was referred to oncology in early October 10/21.  He has not had any definitive diagnosis with biopsy yet.  He's taking morphine for pain in his back, chest and upper abdomen.  He's noticed recent urinary frequency and incontinence over the past 2 days.  He hasn't had a BM in weeks.  He's slower to speak and respond since heing on morphine.  His wife helps him ambulate, use the bathroom over the past week.  Seems like he's been weaker over the apst several weeks.  He notes he's constipated and has issues urinating.  C/o numbness to groin.  He's unable to clearly describe timing.  He complains of pain, but isn't able to describe this well.  No smoking or etoh per his report. Patient underwent MRI of his spine.  Also underwent EGD with biopsy.  Oncology has been following.  Plan is for and if radiation treatments.  Disposition is unclear at this time given apparently worsening condition over the past 48 hours with worsening mental status, unclear if patient will be able to continue to tolerate palliative radiation much less immunotherapy which was discussed previously by oncology team.  Palliative care continues to be involved, discussion with patient's son Andrew Warren and wife Andrew Warren ongoing while we continue to keep the patient comfortable while attempting to complete palliative radiation -currently on IV morphine, IV Ativan and Haldol; requiring increasing doses over the past 24 hours to maintain comfort.  Assessment & Plan:   Goals of care discussion  Lengthy discussion daily with family,  hospice and oncology continue to follow along as well.  Family understands that patient will likely progress to hospice, the question will ultimately be waived we will transition to full hospice and forego any further palliative treatment.  Family reasonably requesting to follow with radiation oncology on Monday, we discussed that if patient is able to continue to tolerate palliative radiation we will continue to support this decision; however, if patient cannot tolerate radiation would strongly suggest further discussion and advancement towards hospice either at home or facility.  Patient currently requiring increasing doses and frequency of morphine, Ativan and Haldol to maintain analgesia and comfort.  Saddle Anesthesia/Concern for Cord Compression in setting of metastatic disease:  MRI c spine with scattered osseous metastatic dz involving the cervical spine (see report) MRI with extensive diffuse osseous mets throughout visualized thoracolumbar spine and pelvis.  Early extraosseous extension of tumor at level of the sacrum /S1 segment with tumor encroaching upon the descending S1 nerve roots bilaterally, R > L.  Slight mass effect on the descending R S1 nerve root without overt neural impingement.  No cord or cauda equina compression.  (see report)  thoracic MRI with diffuse osseous metastatic disease MRI brain canceled due to inability to tolerate  Per Andrew Warren no compressive lesion in lumbar region or conus.  No surgical intervention was recommended. Seems to be stable from a neurological standpoint.  Poorly differentiated adenocarcinoma with metastases Patient seen by oncology.   S/p EGD by GI -> likely malignant esophageal tumor was found in distal esophagus -  biopsied, gastritis biopsied, duodenopathy biopsied -> PPI and sucralfate Patient was also seen by radiation oncology.   Plan is for ongoing palliative radiation treatment: dose 2 of 10 completed yesterday PET 12/12/19 with  widespread metastatic dz including to bones, liver, thoracoabdominal nodes, L adrenal gland.   Palliative care c/s -> DNR, appreciate palliative recs Esophageal mass biopsy came back as poorly differentiated adenocarcinoma.  Oncology has discussed with patient and family.  Plan is to continue with palliative radiation.  Further testing has been ordered to see if he is a candidate for immunotherapy or targeted therapy.  Patient not a candidate for intensive chemotherapy. May also be reasonable to consider palliative care along with hospice.  All these issues were discussed with family and care team.  Acute Metabolic versus toxic encephalopathy, ongoing Likely in the setting of metastatic disease and increasing narcotics Patient has notable bipolar disorder however this appears to be more metabolic versus toxic given above.  Diabetes mellitus type 2 SSI. Holding glipizide.  HbA1c 5.9.  Hyperkalemia Hold off on further lab work pending further goals of care discussion.  Hypernatremia Hold off on further lab work pending further goals of care discussion  CKDIIIb Hold off on further lab work pending further goals of care discussion  Hypercalcemia of malignancy Hold off on further lab work pending further goals of care discussion, consider IV fluids if not tolerating p.o. safely in the next 12 to 24 hours  Bipolar disorder On depakote, seroquel at home.  Continue as able to tolerate p.o. safely.  Hyperlipidemia Continue Lipitor  Normocytic anemia Hold off on further lab work pending further goals of care discussion.  Essential hypertension/sinus tachycardia Tachycardia and hypertensive events likely in setting of pain, continue pain control given goals of care discussion as above  History of BPH Continue Flomax.    Likely peripheral artery disease Noted to have colder right foot compared to the left, and colder feet than hands - adequate capillary refill.  DVT prophylaxis:  Lovenox Code Status: DNR Family Communication: Discussed at length at bedside with family about prognosis, current goals of care and treatment options Disposition: SNF was recommended by PT and OT - family requesting HHPT if stable for home after completion of radiation  Status is: Inpatient  Remains inpatient appropriate because:Inpatient level of care appropriate due to severity of illness   Dispo: The patient is from: Home              Anticipated d/c is to: pending likely home given previous family discussion.  At this point home hospice would likely be most reasonable, awaiting family meeting on January 05, 2020.              Anticipated d/c date is: > 3 days              Patient currently is not medically stable to d/c.  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 Biopsied. - Gastritis. Biopsied. - Erythematous duodenopathy. Biopsied. - Normal second portion of the duodenum. Recommendations Soft diet. - Continue present medications. - Await pathology results. - Follow up with Oncology - Use Protonix (pantoprazole) 40 mg PO BID. - Use sucralfate suspension 1 gram PO QID. - Called his wife and discussed endoscopic findings - GI is available if needed, please call with any questions  Antimicrobials:  Anti-infectives (From admission, onward)   None      Subjective:  Patient markedly somnolent this morning, poorly interactive, answers single word sentence yes/no but otherwise not interactive, tolerating p.o. poorly per wife at bedside and otherwise not participating in discussion today.  Objective: Vitals:   01/06/20 0221 01/06/20 0454 01/06/20 1730 01/06/20 2010  BP: (!) 85/57 (!) 93/53 112/62 110/66  Pulse: 91 94 84 (!) 110  Resp: 16 20 17 19   Temp: 97.7 F (36.5 C) 97.9 F (36.6 C) 98.2 F (36.8 C) 98.8 F (37.1 C)  TempSrc: Oral Oral Oral Oral  SpO2: 100% 99% 98%  100%  Weight:      Height:        Intake/Output Summary (Last 24 hours) at 01/07/2020 0759 Last data filed at 01/06/2020 2040 Gross per 24 hour  Intake 320 ml  Output 500 ml  Net -180 ml   Filed Weights   12/18/2019 1700 12/28/19 0508  Weight: 92.4 kg 93.5 kg    Examination:  General appearance: Somnolent, poorly arousable, no acute distress Resp: Clear to auscultation bilaterally.  Normal effort Cardio: S1-S2 is normal regular.  No S3-S4.  No rubs murmurs or bruit GI: Abdomen is soft.  Noted to be mildly tender in the right upper quadrant without any rebound rigidity or guarding.  No masses organomegaly.   Extremities: No edema.  BLE cool to touch Neurologic: Remains somnolent/poorly interactive.  No focal neurological deficits.   Data Reviewed: I have personally reviewed following labs and imaging studies  CBC: Recent Labs  Lab 01/01/20 0545 01/03/20 0705  WBC 12.3* 9.5  HGB 11.4* 11.4*  HCT 38.2* 37.1*  MCV 90.3 89.4  PLT 189 938    Basic Metabolic Panel: Recent Labs  Lab 01/01/20 0545 01/02/20 0557 01/03/20 0705  NA 141 141 141  K 4.3 4.1 4.1  CL 104 103 104  CO2 26 29 27   GLUCOSE 119* 125* 122*  BUN 24* 26* 29*  CREATININE 1.68* 1.54* 1.60*  CALCIUM 10.6* 10.8* 10.9*    GFR: Estimated Creatinine Clearance: 49 mL/min (A) (by C-G formula based on SCr of 1.6 mg/dL (H)).  Liver Function Tests: Recent Labs  Lab 01/01/20 0545 01/03/20 0705  AST 19 22  ALT 13 19  ALKPHOS 90 91  BILITOT 0.4 0.3  PROT 5.7* 5.9*  ALBUMIN 2.5* 2.5*    CBG: Recent Labs  Lab 01/05/20 2155 01/06/20 0758 01/06/20 1214 01/06/20 1717 01/06/20 2012  GLUCAP 165* 93 129* 80 110*     No results found for this or any previous visit (from the past 240 hour(s)).   Radiology Studies: No results found.   Scheduled Meds: . acetaminophen (TYLENOL) oral liquid 160 mg/5 mL  1,000 mg Oral TID  . atorvastatin  10 mg Oral q1800  . divalproex  1,500 mg Oral QHS  .  enoxaparin (LOVENOX) injection  40 mg Subcutaneous Q24H  . feeding supplement  237 mL Oral BID BM  . insulin aspart  0-9 Units Subcutaneous TID WC  . morphine  15 mg Oral Q12H  . pantoprazole  40 mg Oral BID  . QUEtiapine  25 mg Oral QHS  . senna  2 tablet Oral BID  . sucralfate  1 g Oral TID WC & HS  . tamsulosin  0.4 mg Oral Daily   Continuous Infusions:    LOS: 11 days   Time Spent: >19min  Little Ishikawa, DO [facility]   To contact the attending provider between 7A-7P or the covering provider during after hours 7P-7A, please log into the web site  [email or web address] and access using universal [place] password for that web site. If you do not have the password, please call the hospital operator.  01/07/2020, 7:59 AM

## 2020-01-08 ENCOUNTER — Ambulatory Visit: Payer: Medicare Other

## 2020-01-08 DIAGNOSIS — L8990 Pressure ulcer of unspecified site, unspecified stage: Secondary | ICD-10-CM | POA: Insufficient documentation

## 2020-01-08 MED ORDER — GLYCOPYRROLATE 0.2 MG/ML IJ SOLN: 0.1000 mg | INTRAMUSCULAR | Status: DC

## 2020-01-08 MED ORDER — ATROPINE SULFATE 1 % OP SOLN
4.0000 [drp] | OPHTHALMIC | Status: DC | PRN
  Administered 2020-01-08: 4 [drp] via SUBLINGUAL
  Filled 2020-01-08: qty 2

## 2020-01-08 MED ORDER — ALBUMIN HUMAN 5 % IV SOLN
12.5000 g | Freq: Once | INTRAVENOUS | Status: AC
  Administered 2020-01-08: 12.5 g via INTRAVENOUS
  Filled 2020-01-08: qty 250

## 2020-01-08 MED ORDER — MORPHINE 100MG IN NS 100ML (1MG/ML) PREMIX INFUSION
1.0000 mg/h | INTRAVENOUS | Status: DC
  Administered 2020-01-08: 1 mg/h via INTRAVENOUS
  Filled 2020-01-08: qty 100

## 2020-01-08 MED ORDER — SCOPOLAMINE 1 MG/3DAYS TD PT72
1.0000 | MEDICATED_PATCH | TRANSDERMAL | Status: DC
  Administered 2020-01-08: 1.5 mg via TRANSDERMAL
  Filled 2020-01-08: qty 1

## 2020-01-08 MED ORDER — GLYCOPYRROLATE 0.2 MG/ML IJ SOLN
0.1000 mg | INTRAMUSCULAR | Status: DC | PRN
  Administered 2020-01-08: 0.1 mg via INTRAVENOUS
  Filled 2020-01-08: qty 1

## 2020-01-09 ENCOUNTER — Ambulatory Visit: Payer: Medicare Other

## 2020-01-10 ENCOUNTER — Ambulatory Visit: Payer: Medicare Other

## 2020-01-10 NOTE — Progress Notes (Signed)
Time of death was at 1206. Family at bedside. Death confirmed with Waldo Laine, RN. Dr. Avon Gully Notified. CDS also called. Eye preparation done per instruction and body preparation complete.

## 2020-01-10 NOTE — TOC Initial Note (Signed)
Transition of Care [facility]) - Initial/Assessment Note    Patient Details  Name: Andrew Warren MRN: [id number] Date of Birth: 1950-03-30  Transition of Care [facility]) CM/SW Contact:    Leeroy Cha, RN Phone Number: 2020-01-14, 9:15 AM  Clinical Narrative:                 Pt transferred to stepdown for closer monitoring due to sudden onset of worsening hypotension and HR in 170s, temp of 102.5.  Temp treated with IV Toradol due to extreme somnolence.  Spoke with pt's spouse [name] over the phone and recommended she visit if able.  Spoke with her and pt's son when they arrived.  Discussed treatments, including pressors and likelihood of no meaningful outcome.  Both reported they wanted comfort measures only, no additional medications or treatments other than pain meds.  Pt's daughter is on her way to the hospital, and family agreed to continue fluids until she arrived to give her a chance to say goodbye.  Discussed with RN and she will discontinue fluids after daughter arrives and family is ready.-    Barriers to Discharge: No Barriers Identified   Patient Goals and CMS Choice        Expected Discharge Plan and Services                                                Prior Living Arrangements/Services                       Activities of Daily Living Home Assistive Devices/Equipment: None ADL Screening (condition at time of admission) Patient's cognitive ability adequate to safely complete daily activities?: No Is the patient deaf or have difficulty hearing?: No Does the patient have difficulty seeing, even when wearing glasses/contacts?: No Does the patient have difficulty concentrating, remembering, or making decisions?: Yes Patient able to express need for assistance with ADLs?: Yes Does the patient have difficulty dressing or bathing?: No Independently performs ADLs?: No Communication: Independent Dressing (OT): Appropriate for developmental  age Does the patient have difficulty walking or climbing stairs?: Yes Weakness of Legs: Both Weakness of Arms/Hands: None  Permission Sought/Granted                  Emotional Assessment              Admission diagnosis:  Metastatic cancer [facility]) [C79.9] Patient Active Problem List   Diagnosis Date Noted  . Pressure injury of skin 01/14/2020  . Goals of care, counseling/discussion   . Esophageal mass   . Abnormal gastrointestinal positron emission tomography (PET) scan   . Palliative care encounter   . Esophageal cancer, stage IV ([place]) 12/22/2019  . Metastatic cancer ([place]) 01/01/2020  . 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 Pain in left shoulder 11/10/2019  . Hesitancy 02/24/2019  . Synovitis of left knee 12/03/2017  . Chronic diarrhea 01/20/2016  . Chest pain 03/26/2014  . Class 2 severe obesity due to excess calories with serious comorbidity and body mass index (BMI) of 36.0 to 36.9 in adult ([place]) 03/26/2014  . Diabetes mellitus ([place])   . Chronic kidney disease   . BPH (benign prostatic hyperplasia)   . High cholesterol   . Pain in the chest   . Bipolar affective disorder ([place]) 06/30/2012  . Insomnia  due to mental disorder 06/30/2012  . Diarrhea 12/01/2010  . Adenomatous polyps 12/01/2010   PCP:  [name], MD Pharmacy:   [id number] [place], [place] [address] [place] [address] Phone: [phone] Fax: [phone] [facility], [place] [address] [place] [address] Phone: [phone] Fax: [place], [address] [place] [address] Phone: [phone] Fax: [phone]     Social Determinants of Health (SDOH) Interventions    Readmission Risk Interventions No flowsheet data found.

## 2020-01-10 NOTE — Progress Notes (Signed)
Dr. Tyler Pita, radiation oncologist, and treatment team (L1) notified of patient's passing. All radiation treatment appointments will be cancelled.

## 2020-01-10 NOTE — Death Summary Note (Signed)
Death Summary  [name] DOB: 1950/11/01 DOA: 20-Jan-2020  PCP: [name], MD  Admit date: 01-20-20 Date of Death: 2020-02-01  Final Diagnoses:  Active Problems:   Metastatic cancer ([place])   Esophageal mass   Abnormal gastrointestinal positron emission tomography (PET) scan   Palliative care encounter   Goals of care, counseling/discussion   Pressure injury of skin   Poorly differentiated adenocarcinoma with diffuse metastatic disease  Hospital Course:  Andrew Soward Thorntonis a [birth year] 69 y.o.malewith medical history significant ofbipolar disorder, HTN, HLD, and metastatic cancer who presents to the hospital after a clinic visit. Per oncology note, he was seen by orthopedics outpatient who diagnosed cancer via MRI and he was referred to oncology in early October 10/21. He has not had any definitive diagnosis with biopsy yet. He's taking morphine for pain in his back, chest and upper abdomen. He's noticed recent urinary frequency and incontinence over the past 2 days. He hasn't had a BM in weeks. He's slower to speak and respond since heing on morphine. His wife helps him ambulate, use the bathroom over the past week. Seems like he's been weaker over the apst several weeks. He notes he's constipated and has issues urinating. C/o numbness to groin. He's unable to clearly describe timing. He complains of pain, but isn't able to describe this well. No smoking or etoh per his report. Patient underwent MRI of his spine.  Also underwent EGD with biopsy.  Oncology has been following.  Plan is for and if radiation treatments.  Disposition is unclear at this time given apparently worsening condition over the past 48 hours with worsening status, mental status, unclear if patient will be able to continue to tolerate palliative radiation much less immunotherapy which was discussed previously by oncology team.  Palliative care continues to be involved, discussion with  patient's son Andrew Warren and wife Andrew Warren ongoing while we continue to keep the patient comfortable while attempting to complete palliative radiation -currently on IV morphine, IV Ativan and Haldol; requiring increasing doses over the past 24 hours to maintain comfort.  Patient continued to have worsening mental status, hypoxia, uncontrolled in the setting diffuse metastatic disease.  Patient was attempting to palliative radiation over the past few days, but was unfortunately unable to tolerate unable at this time after further discussion with family given worsen mental status, worsening vitals including hypotension and tachycardia.  Time of death 05-24-1204  Signed:  Little Ishikawa DO  [facility] 2020/02/01, 2:38 PM

## 2020-01-10 NOTE — Progress Notes (Signed)
Physical Therapy Discharge Patient Details Name: Andrew Warren MRN: [id number] DOB: Aug 30, 1950 Today's Date: 23-Jan-2020 Time:  -     Patient discharged from PT services secondary to medical decline -    GP     [name] 01/23/2020, 7:31 AM [place] Pager [phone] Office [phone]

## 2020-01-10 NOTE — Progress Notes (Signed)
Pt transferred to stepdown for closer monitoring due to sudden onset of worsening hypotension and HR in 170s, temp of 102.5.  Temp treated with IV Toradol due to extreme somnolence.  Spoke with pt's spouse [name] over the phone and recommended she visit if able.  Spoke with her and pt's son when they arrived.  Discussed treatments, including pressors and likelihood of no meaningful outcome.  Both reported they wanted comfort measures only, no additional medications or treatments other than pain meds.  Pt's daughter is on her way to the hospital, and family agreed to continue fluids until she arrived to give her a chance to say goodbye.  Discussed with RN and she will discontinue fluids after daughter arrives and family is ready.-

## 2020-01-10 NOTE — Progress Notes (Signed)
Chaplain engaged in initial visit with Andrew Warren and his family and clergy.  Chaplain offered support and explained her role.  Chaplain was able to learn about Andrew Warren through wife and children.  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  He normally had a route to [place].  His children would join him on his truck in the summer.  He son described him as a simple man who taught him about truck, tractors and driving.  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    Chaplain also learned about Andrew Warren's diagnosis and how quickly his health declined.  Family mentioned that Andrew Warren was still driving trucks about 2 months ago and through hurting his shoulder at work he found out about the tumors and masses in his body.  Son shared that as Andrew Warren health declined he apologized a lot for needing so much help.  Chaplain offered ministries of presence and listening.  Chaplain also encouraged family to continue to talk to Andrew Warren and share stories like they have been doing.  Andrew Warren's pastor is also in the room with the family.  Chaplain will follow-up as needed.    Jan 11, 2020 1200  Clinical Encounter Type  Visited With Patient and family together  Visit Type Initial

## 2020-01-10 NOTE — Plan of Care (Signed)
Family called by MD to come in since patient is declining and only responsive to pain at this time. Problem: Clinical Measurements: Goal: Will remain free from infection Outcome: Progressing

## 2020-01-10 NOTE — TOC Transition Note (Signed)
Transition of Care [facility]) - CM/SW Discharge Note   Patient Details  Name: Andrew Warren MRN: [id number] Date of Birth: 08/29/1950  Transition of Care [facility]) CM/SW Contact:  Leeroy Cha, RN Phone Number: 01-18-2020, 2:04 PM   Clinical Narrative:    Patient expired at 1206   Final next level of care: Expired Barriers to Discharge: No Barriers Identified   Patient Goals and CMS Choice        Discharge Placement                       Discharge Plan and Services                                     Social Determinants of Health (SDOH) Interventions     Readmission Risk Interventions No flowsheet data found.

## 2020-01-10 NOTE — Progress Notes (Signed)
Patient transfer to ICU for unstable vitals. Provider Lang Snow, FNP and RRT nurse Alfredia Ferguson, RN was at the bedside. Patient transfer report given to Merleen Nicely, RN on 2nd floor. Per Reola Mosher, FNP She will call family to give updates.

## 2020-01-10 DEATH — deceased

## 2020-01-11 ENCOUNTER — Ambulatory Visit: Payer: Medicare Other

## 2020-01-12 ENCOUNTER — Ambulatory Visit: Payer: Medicare Other

## 2020-01-15 ENCOUNTER — Ambulatory Visit: Payer: Medicare Other

## 2020-01-16 ENCOUNTER — Ambulatory Visit: Payer: Medicare Other

## 2020-01-17 ENCOUNTER — Ambulatory Visit: Payer: Medicare Other

## 2020-01-18 ENCOUNTER — Ambulatory Visit: Payer: Medicare Other

## 2020-02-16 ENCOUNTER — Telehealth (HOSPITAL_COMMUNITY): Payer: Medicare Other | Admitting: Psychiatry

## 2020-02-21 NOTE — Progress Notes (Signed)
  Radiation Oncology         [phone]) [phone] ________________________________  Name: Andrew Warren MRN: [id number]  Date: 01/02/2020  DOB: [birth year]  End of Treatment Note  Diagnosis:   70 y.o. patient with lumbar sacral metastasis     Indication for treatment:  Palliation       Radiation treatment dates:   01/02/20  Site/dose:   He was set up for 10 treatments, but, only received one dose of 3 Gy and discontinued treatment  Beams/energy:   A 4-field 3D arrangement was used with static gantries at zero, 90, 180 and 270 with custom MLC per beam  Narrative: The patient tolerated radiation treatment relatively well.     Plan: The patient has completed radiation treatment. The patient will return to radiation oncology clinic for routine followup in one month. I advised him to call or return sooner if he has any questions or concerns related to his recovery or treatment. ________________________________  Sheral Apley. Tammi Klippel, M.D.

## 2020-03-05 ENCOUNTER — Telehealth: Payer: Self-pay | Admitting: *Deleted

## 2020-03-05 NOTE — Telephone Encounter (Signed)
Connected with spouse Michaelene Song [phone]).  Copy of [employer] form for Policy no. [id number] will be provided for pick-up 03/06/2020.  Faxed original 01/11/2020 to [phone].  Confirmed address in EMR yet original not received via mail.

## 2022-04-20 IMAGING — MR MR THORACIC SPINE W/O CM
6 of 7 series · 43 of 48 positions shown · non-contrast
Comparison: None.

CLINICAL DATA: Metastatic esophageal cancer

EXAM:
MRI THORACIC SPINE WITHOUT CONTRAST
TECHNIQUE: Multiplanar, multisequence MR imaging of the thoracic spine was
performed. No intravenous contrast was administered.

[Series 16: T1 · sagittal · 4.0mm · 1.72mm/px · 2 of 5 slices shown (1 of 2)]
[im 1/5]
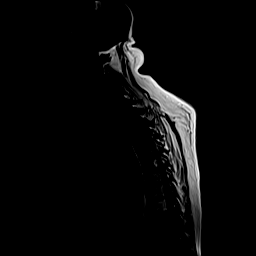
[im 5/5]
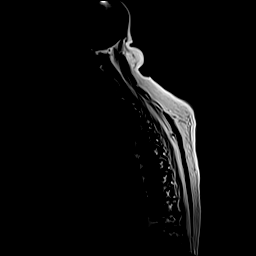

[Series 17: STIR · sagittal · 3.0mm · 1.06mm/px · 5 of 15 slices shown]
[im 1/15]
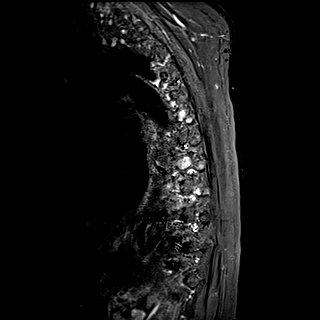
[im 4/15]
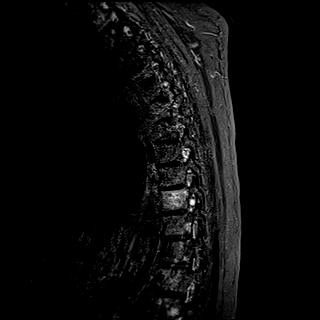
[im 8/15]
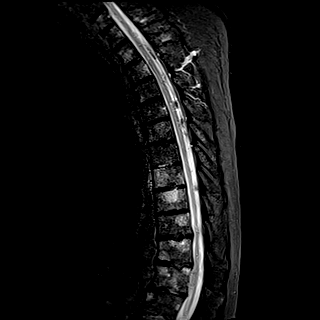
[im 11/15]
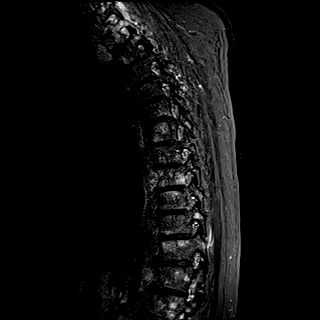
[im 15/15]
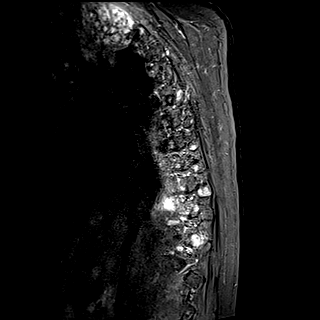

[Series 18: T1 · sagittal · 3.0mm · 1.06mm/px · 5 of 15 slices shown (2 of 2)]
[im 1/15]
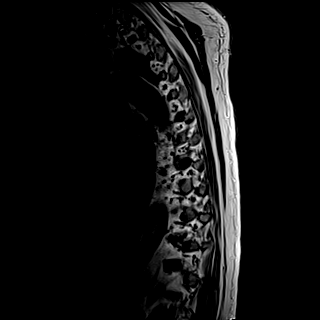
[im 4/15]
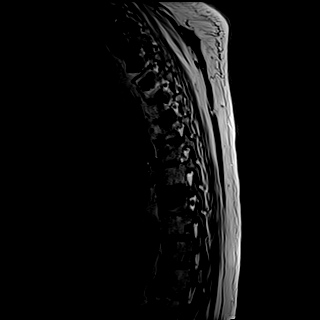
[im 8/15]
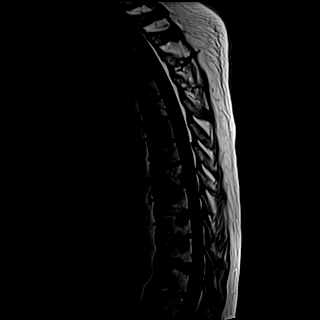
[im 11/15]
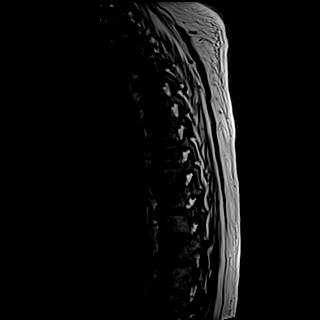
[im 15/15]
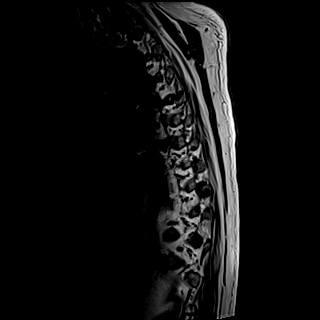

[Series 19: T2 · sagittal · 3.0mm · 0.89mm/px · 5 of 15 slices shown (1 of 2)]
[im 1/15]
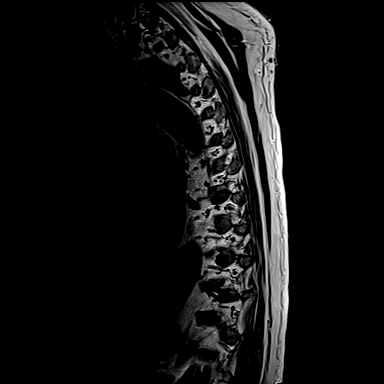
[im 4/15]
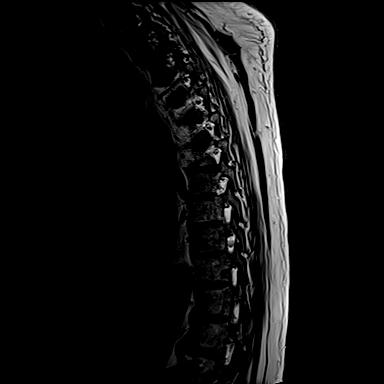
[im 8/15]
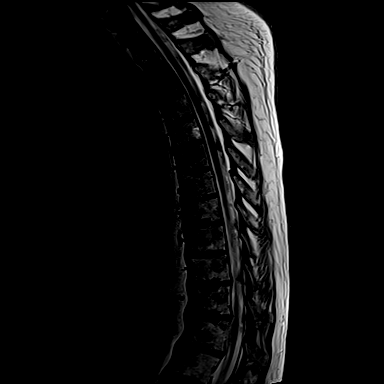
[im 11/15]
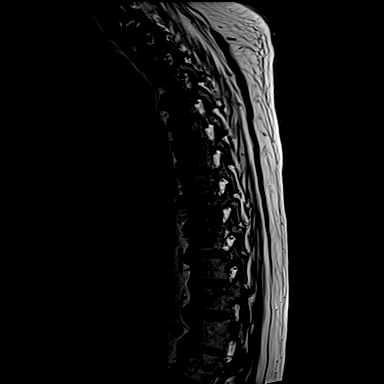
[im 15/15]
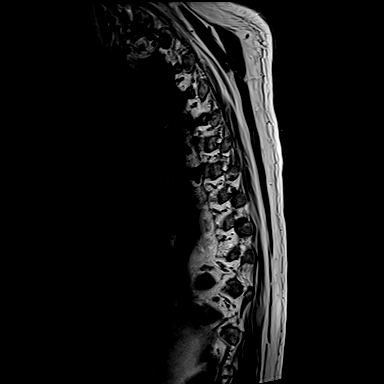

[Series 20: T2 · axial · 4.0mm · 0.78mm/px · z∈[-203,-3]mm · 13 of 39 slices shown (2 of 2)]
[im 1/39]
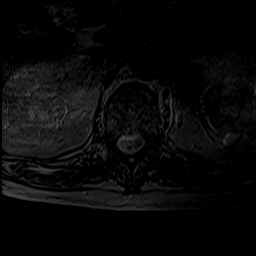
[im 4/39]
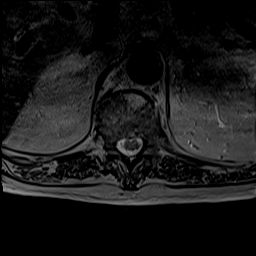
[im 7/39]
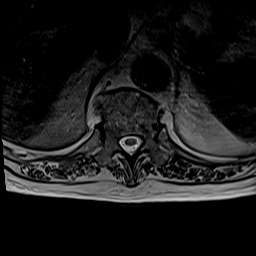
[im 10/39]
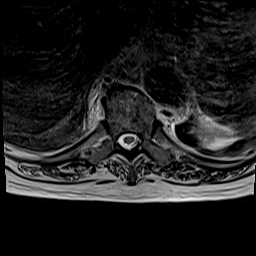
[im 13/39]
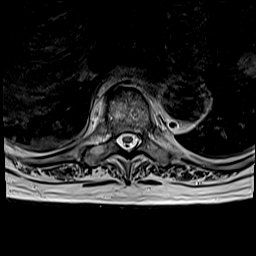
[im 16/39]
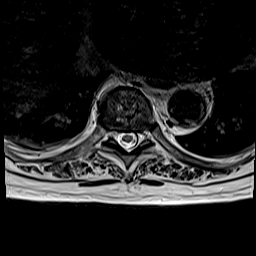
[im 20/39]
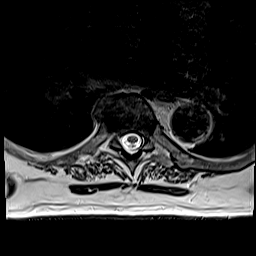
[im 23/39]
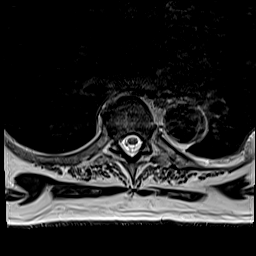
[im 26/39]
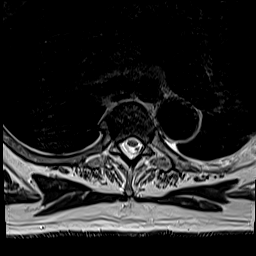
[im 29/39]
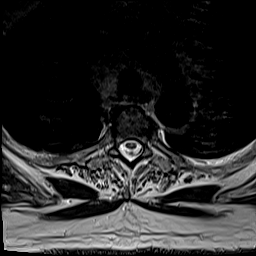
[im 32/39]
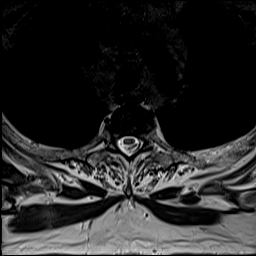
[im 35/39]
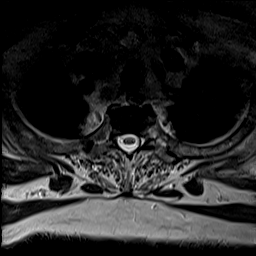
[im 39/39]
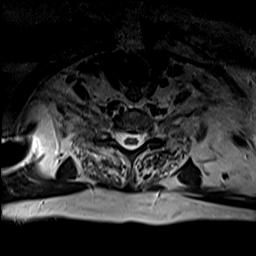

[Series 21: t2_me2d_tra · axial · 4.0mm · 0.52mm/px · z∈[-203,-3]mm · 13 of 39 slices shown]
[im 1/39]
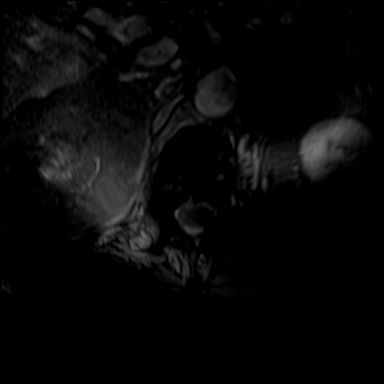
[im 4/39]
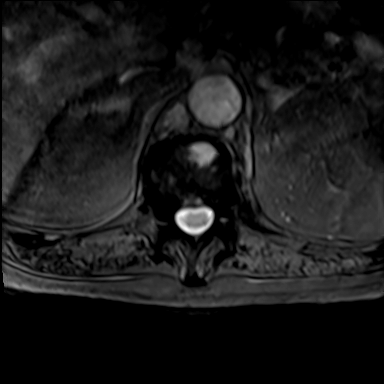
[im 7/39]
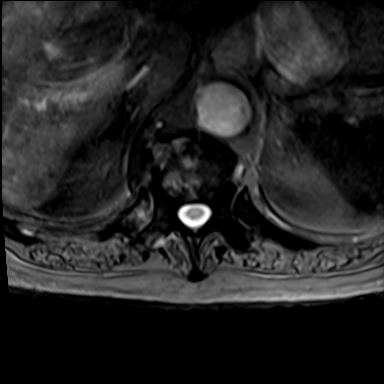
[im 10/39]
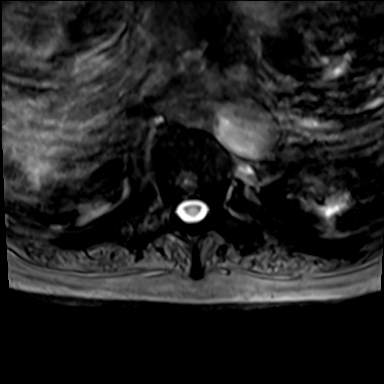
[im 13/39]
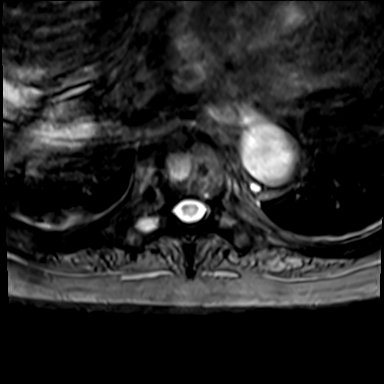
[im 16/39]
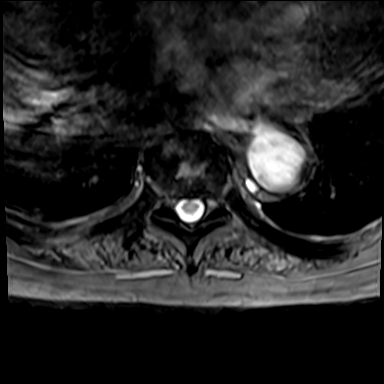
[im 20/39]
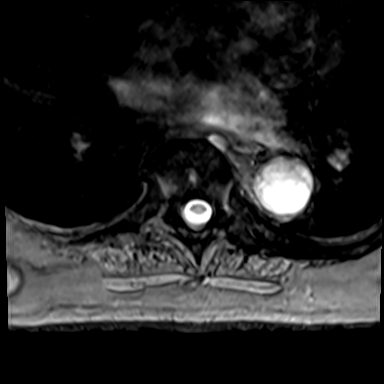
[im 23/39]
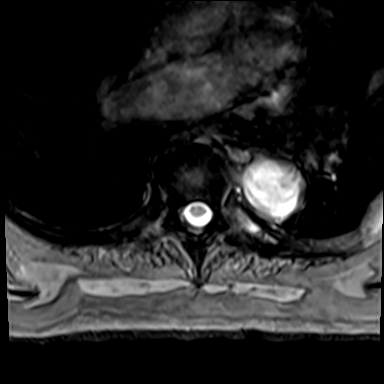
[im 26/39]
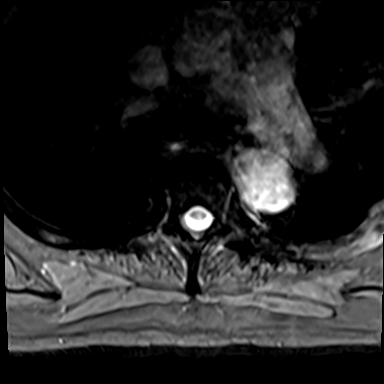
[im 29/39]
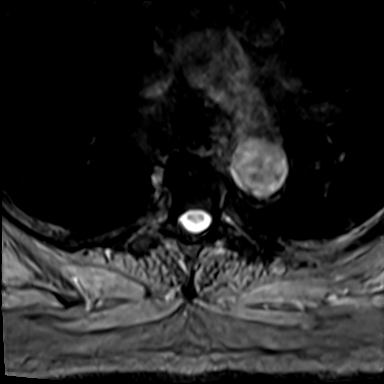
[im 32/39]
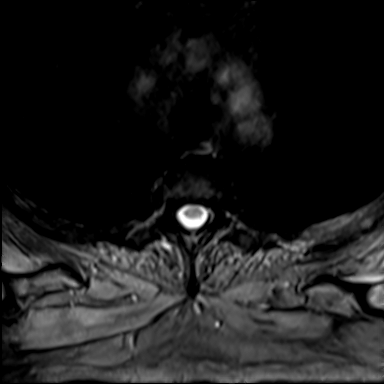
[im 35/39]
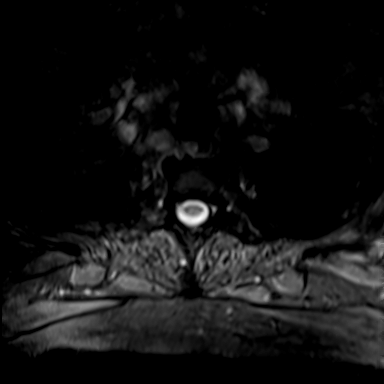
[im 39/39]
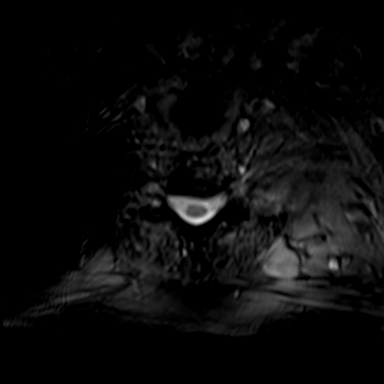

[43 of 48 positions shown; findings below may reference images not displayed]

FINDINGS: Alignment:  Anteroposterior alignment is maintained.

Vertebrae: STIR hyperintense lesions throughout the thoracic spine
reflecting metastatic disease with involvement of posterior
elements. No acute compression deformity. No significant epidural
extension.

Cord:  Normal caliber and signal.

Paraspinal and other soft tissues: Unremarkable.

Disc levels:

Mild multilevel degenerative disc disease and facet arthropathy.
There is no high-grade degenerative stenosis.
IMPRESSION: Diffuse osseous metastatic disease. No acute compression deformity
or significant epidural extension.
